# Patient Record
Sex: Female | Born: 1965 | Race: White | Hispanic: Yes | Marital: Married | Smoking: Never smoker
Health system: Southern US, Community
[De-identification: ages and names within clinical notes are randomized; demographics above are authoritative.]

## PROBLEM LIST (undated history)

## (undated) DIAGNOSIS — N809 Endometriosis, unspecified: Secondary | ICD-10-CM

## (undated) DIAGNOSIS — A048 Other specified bacterial intestinal infections: Secondary | ICD-10-CM

## (undated) DIAGNOSIS — Z1379 Encounter for other screening for genetic and chromosomal anomalies: Secondary | ICD-10-CM

## (undated) DIAGNOSIS — K5909 Other constipation: Secondary | ICD-10-CM

## (undated) DIAGNOSIS — K589 Irritable bowel syndrome without diarrhea: Secondary | ICD-10-CM

## (undated) DIAGNOSIS — T7840XA Allergy, unspecified, initial encounter: Secondary | ICD-10-CM

## (undated) DIAGNOSIS — J45909 Unspecified asthma, uncomplicated: Secondary | ICD-10-CM

## (undated) DIAGNOSIS — A498 Other bacterial infections of unspecified site: Secondary | ICD-10-CM

## (undated) DIAGNOSIS — K5792 Diverticulitis of intestine, part unspecified, without perforation or abscess without bleeding: Secondary | ICD-10-CM

## (undated) DIAGNOSIS — R109 Unspecified abdominal pain: Secondary | ICD-10-CM

## (undated) DIAGNOSIS — G8929 Other chronic pain: Secondary | ICD-10-CM

## (undated) DIAGNOSIS — N6019 Diffuse cystic mastopathy of unspecified breast: Secondary | ICD-10-CM

## (undated) HISTORY — DX: Irritable bowel syndrome, unspecified: K58.9

## (undated) HISTORY — DX: Unspecified abdominal pain: R10.9

## (undated) HISTORY — DX: Other bacterial infections of unspecified site: A49.8

## (undated) HISTORY — DX: Endometriosis, unspecified: N80.9

## (undated) HISTORY — PX: UPPER GASTROINTESTINAL ENDOSCOPY: SHX188

## (undated) HISTORY — PX: HERNIA REPAIR: SHX51

## (undated) HISTORY — DX: Diverticulitis of intestine, part unspecified, without perforation or abscess without bleeding: K57.92

## (undated) HISTORY — DX: Allergy, unspecified, initial encounter: T78.40XA

## (undated) HISTORY — DX: Diffuse cystic mastopathy of unspecified breast: N60.19

## (undated) HISTORY — DX: Other chronic pain: G89.29

## (undated) HISTORY — DX: Encounter for other screening for genetic and chromosomal anomalies: Z13.79

## (undated) HISTORY — PX: ECTOPIC PREGNANCY SURGERY: SHX613

---
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
## 2001-07-05 HISTORY — PX: EYE SURGERY: SHX253

## 2004-04-06 ENCOUNTER — Emergency Department: Payer: Self-pay | Admitting: Emergency Medicine

## 2005-07-05 HISTORY — PX: ABDOMINAL HYSTERECTOMY: SHX81

## 2005-07-26 ENCOUNTER — Inpatient Hospital Stay: Payer: Self-pay | Admitting: Obstetrics and Gynecology

## 2006-02-17 ENCOUNTER — Ambulatory Visit: Payer: Self-pay | Admitting: Internal Medicine

## 2006-04-07 ENCOUNTER — Ambulatory Visit: Payer: Self-pay | Admitting: Gastroenterology

## 2006-04-20 ENCOUNTER — Ambulatory Visit: Payer: Self-pay | Admitting: General Practice

## 2006-05-03 ENCOUNTER — Ambulatory Visit: Payer: Self-pay | Admitting: General Practice

## 2007-01-27 ENCOUNTER — Emergency Department: Payer: Self-pay | Admitting: Emergency Medicine

## 2007-08-01 ENCOUNTER — Ambulatory Visit: Payer: Self-pay | Admitting: Family Medicine

## 2008-07-05 HISTORY — PX: COLONOSCOPY: SHX174

## 2008-08-20 ENCOUNTER — Ambulatory Visit: Payer: Self-pay | Admitting: Family Medicine

## 2008-09-04 ENCOUNTER — Ambulatory Visit: Payer: Self-pay | Admitting: Gastroenterology

## 2008-09-12 ENCOUNTER — Ambulatory Visit: Payer: Self-pay | Admitting: Gastroenterology

## 2008-09-17 ENCOUNTER — Ambulatory Visit: Payer: Self-pay | Admitting: Gastroenterology

## 2009-04-07 ENCOUNTER — Ambulatory Visit: Payer: Self-pay | Admitting: Family Medicine

## 2009-05-20 ENCOUNTER — Ambulatory Visit: Payer: Self-pay | Admitting: Family Medicine

## 2010-04-28 ENCOUNTER — Ambulatory Visit: Payer: Self-pay | Admitting: General Practice

## 2010-12-02 ENCOUNTER — Encounter: Payer: Self-pay | Admitting: Family Medicine

## 2010-12-04 ENCOUNTER — Encounter: Payer: Self-pay | Admitting: Family Medicine

## 2011-01-03 ENCOUNTER — Encounter: Payer: Self-pay | Admitting: Family Medicine

## 2011-04-27 ENCOUNTER — Ambulatory Visit: Payer: Self-pay

## 2012-05-02 ENCOUNTER — Ambulatory Visit: Payer: Self-pay | Admitting: Family Medicine

## 2012-09-30 ENCOUNTER — Encounter: Payer: Self-pay | Admitting: General Surgery

## 2012-11-10 ENCOUNTER — Encounter: Payer: Self-pay | Admitting: *Deleted

## 2012-11-23 ENCOUNTER — Ambulatory Visit (INDEPENDENT_AMBULATORY_CARE_PROVIDER_SITE_OTHER): Payer: PRIVATE HEALTH INSURANCE | Admitting: General Surgery

## 2012-11-23 ENCOUNTER — Other Ambulatory Visit: Payer: Self-pay | Admitting: *Deleted

## 2012-11-23 ENCOUNTER — Encounter: Payer: Self-pay | Admitting: General Surgery

## 2012-11-23 VITALS — BP 100/64 | HR 80 | Resp 14 | Ht 63.0 in | Wt 137.0 lb

## 2012-11-23 DIAGNOSIS — Z1231 Encounter for screening mammogram for malignant neoplasm of breast: Secondary | ICD-10-CM

## 2012-11-23 DIAGNOSIS — Z803 Family history of malignant neoplasm of breast: Secondary | ICD-10-CM | POA: Insufficient documentation

## 2012-11-23 DIAGNOSIS — N6019 Diffuse cystic mastopathy of unspecified breast: Secondary | ICD-10-CM

## 2012-11-23 NOTE — Progress Notes (Signed)
The patient will be asked to return to the office in six months for a bilateral screening mammogram. 

## 2012-11-23 NOTE — Progress Notes (Signed)
Patient ID: Isabella Robertson, female   DOB: Jan 27, 1966, 47 y.o.   MRN: [id number]  Chief Complaint  Patient presents with  . Other    breast check    HPI Isabella Robertson is a 47 y.o. female here today for an follow up breast check. Patient was seen in January 2014 for left breast pain. 2 cysts were aspirated at that time. The patient still complains for left breast pain but no other complaints at this time.  HPI  Past Medical History  Diagnosis Date  . Diffuse cystic mastopathy   . Endometriosis     Past Surgical History  Procedure Laterality Date  . Ectopic pregnancy surgery  4098,1191  . Abdominal hysterectomy  2007  . Colonoscopy  2010  . Eye surgery  2003  . Cesarean section  2003  . Hernia repair  age 51    Family History  Problem Relation Age of Onset  . Breast cancer Paternal Aunt   . Breast cancer Mother 85  . Breast cancer Cousin     breast - paternal side    Social History History  Substance Use Topics  . Smoking status: Never Smoker   . Smokeless tobacco: Never Used  . Alcohol Use: No    No Known Allergies  Current Outpatient Prescriptions  Medication Sig Dispense Refill  . cetirizine (ZYRTEC) 10 MG tablet Take 10 mg by mouth daily as needed for allergies.       No current facility-administered medications for this visit.    Review of Systems Review of Systems  Constitutional: Negative.   Respiratory: Negative.   Cardiovascular: Negative.     Blood pressure 100/64, pulse 80, resp. rate 14, height 5\' 3"  (1.6 m), weight 137 lb (62.143 kg).  Physical Exam Physical Exam  Constitutional: She appears well-developed and well-nourished.  Neck: Trachea normal. No mass and no thyromegaly present.  Pulmonary/Chest: Right breast exhibits no inverted nipple, no mass, no nipple discharge, no skin change and no tenderness. Left breast exhibits no inverted nipple, no mass, no nipple discharge, no skin change and no tenderness. Breasts are symmetrical.   Lymphadenopathy:    She has no cervical adenopathy.    She has no axillary adenopathy.    Data Reviewed none Assessment    Fibercystic disease stable exam.   Her mother had breast cancer at age 71 and a paternal cousin with breast cancer.   Plan    Given high risk discussed genetic testing patient is agreeable as long as finances allow.     Next follow up in November 2014 with bilateral screening mammogram.    Kieth Brightly 11/23/2012, 2:47 PM

## 2012-11-23 NOTE — Patient Instructions (Addendum)
Patient to return in November 2014 with bilateral screening mammogram. Continue with monthly SBE.

## 2012-11-28 ENCOUNTER — Telehealth: Payer: Self-pay | Admitting: *Deleted

## 2012-11-28 DIAGNOSIS — Z1379 Encounter for other screening for genetic and chromosomal anomalies: Secondary | ICD-10-CM

## 2012-11-28 HISTORY — DX: Encounter for other screening for genetic and chromosomal anomalies: Z13.79

## 2012-11-28 NOTE — Telephone Encounter (Signed)
Phone call from pt/employer asking about testing.  Genetic testing process, cost and insurance process reviewed.  Pt to call back when ready to proceed.  She will need a nurse appt and aware NPO 1 hr before test.

## 2012-12-12 ENCOUNTER — Telehealth: Payer: Self-pay | Admitting: *Deleted

## 2012-12-12 NOTE — Telephone Encounter (Signed)
Notified patient as instructed that genetic testing was negative, patient pleased. Mailed copy to patient. Discussed follow-up appointments in November, patient agrees

## 2012-12-19 ENCOUNTER — Encounter: Payer: Self-pay | Admitting: General Surgery

## 2013-04-04 ENCOUNTER — Ambulatory Visit: Payer: Self-pay | Admitting: Family Medicine

## 2013-05-02 ENCOUNTER — Ambulatory Visit: Payer: Self-pay | Admitting: General Practice

## 2013-05-03 ENCOUNTER — Encounter: Payer: Self-pay | Admitting: General Surgery

## 2013-05-14 ENCOUNTER — Encounter: Payer: Self-pay | Admitting: General Surgery

## 2013-05-14 ENCOUNTER — Ambulatory Visit (INDEPENDENT_AMBULATORY_CARE_PROVIDER_SITE_OTHER): Payer: PRIVATE HEALTH INSURANCE | Admitting: General Surgery

## 2013-05-14 VITALS — BP 120/74 | HR 76 | Resp 12 | Ht 63.0 in | Wt 140.0 lb

## 2013-05-14 DIAGNOSIS — D179 Benign lipomatous neoplasm, unspecified: Secondary | ICD-10-CM

## 2013-05-14 DIAGNOSIS — Z803 Family history of malignant neoplasm of breast: Secondary | ICD-10-CM

## 2013-05-14 DIAGNOSIS — N6019 Diffuse cystic mastopathy of unspecified breast: Secondary | ICD-10-CM

## 2013-05-14 NOTE — Patient Instructions (Addendum)
Patient to return in 1 year with a bilateral screening mammogram. 

## 2013-05-14 NOTE — Progress Notes (Signed)
Patient ID: Isabella Robertson, female   DOB: April 01, 1966, 47 y.o.   MRN: [id number]  Chief Complaint  Patient presents with  . Follow-up    6 month screening mammogram    HPI Isabella Robertson is a 47 y.o. female who presents for a breast evaluation. The most recent mammogram was done on 05/02/13. Patient does perform regular self breast checks and gets regular mammograms done.  The patient denies any new problems with the breasts at this time. She has had prior cysts aspirated. Had genetic test done for family history - no mutation noted.    HPI  Past Medical History  Diagnosis Date  . Diffuse cystic mastopathy   . Endometriosis     Past Surgical History  Procedure Laterality Date  . Ectopic pregnancy surgery  8295,6213  . Abdominal hysterectomy  2007  . Colonoscopy  2010  . Eye surgery  2003  . Cesarean section  2003  . Hernia repair  age 89    Family History  Problem Relation Age of Onset  . Breast cancer Paternal Aunt   . Breast cancer Mother 35  . Breast cancer Cousin     breast - paternal side    Social History History  Substance Use Topics  . Smoking status: Never Smoker   . Smokeless tobacco: Never Used  . Alcohol Use: No    No Known Allergies  Current Outpatient Prescriptions  Medication Sig Dispense Refill  . cetirizine (ZYRTEC) 10 MG tablet Take 10 mg by mouth daily as needed for allergies.       No current facility-administered medications for this visit.    Review of Systems Review of Systems  Constitutional: Negative.   Respiratory: Negative.   Cardiovascular: Negative.     Blood pressure 120/74, pulse 76, resp. rate 12, height 5\' 3"  (1.6 m), weight 140 lb (63.504 kg).  Physical Exam Physical Exam  Constitutional: She is oriented to person, place, and time. She appears well-developed and well-nourished.  Eyes: Conjunctivae are normal. No scleral icterus.  Neck: No thyromegaly present.  Cardiovascular: Normal rate, regular rhythm, normal  heart sounds and normal pulses.   No murmur heard. Pulses:      Dorsalis pedis pulses are 2+ on the right side, and 2+ on the left side.       Posterior tibial pulses are 2+ on the right side, and 2+ on the left side.  No edema no varicose veins.   Pulmonary/Chest: Effort normal and breath sounds normal. Right breast exhibits no inverted nipple, no mass, no nipple discharge, no skin change and no tenderness. Left breast exhibits no inverted nipple, no mass, no nipple discharge, no skin change and no tenderness.  There is more firmness and mild irregularity in the upper outer quadrant of both breasts.   Abdominal: Soft. Normal appearance and bowel sounds are normal. There is no hepatosplenomegaly. There is no tenderness.  2 cm lipoma just below the left costal margin.   Lymphadenopathy:    She has no cervical adenopathy.    She has no axillary adenopathy.  Neurological: She is alert and oriented to person, place, and time.  Skin: Skin is warm and dry.    Data Reviewed Mammogram reviewed and stable.   Assessment    Exam stable. High risk for breast cancer due to family history. FCD, stable. Pt has a lipoma below left costal margin. This is not symptomatic and needs no further evaluation-she did have an [place] already and this was reviewed.Marland Kitchen  Pt advised to call if it causes pain or enlarges in size.    Plan    Patient to return in 1 year with bilateral screening mammogram.        Gerlene Robertson G 05/14/2013, 6:41 PM

## 2014-02-19 ENCOUNTER — Ambulatory Visit: Payer: Self-pay | Admitting: Gastroenterology

## 2014-02-28 ENCOUNTER — Ambulatory Visit: Payer: Self-pay | Admitting: Gastroenterology

## 2014-03-05 ENCOUNTER — Ambulatory Visit: Payer: Self-pay | Admitting: Gastroenterology

## 2014-04-30 ENCOUNTER — Ambulatory Visit: Payer: Self-pay | Admitting: General Surgery

## 2014-05-01 ENCOUNTER — Encounter: Payer: Self-pay | Admitting: General Surgery

## 2014-05-06 ENCOUNTER — Encounter: Payer: Self-pay | Admitting: General Surgery

## 2014-05-13 ENCOUNTER — Encounter: Payer: Self-pay | Admitting: General Surgery

## 2014-05-13 ENCOUNTER — Ambulatory Visit (INDEPENDENT_AMBULATORY_CARE_PROVIDER_SITE_OTHER): Payer: PRIVATE HEALTH INSURANCE | Admitting: General Surgery

## 2014-05-13 VITALS — BP 130/70 | HR 72 | Resp 12 | Ht 63.0 in | Wt 137.0 lb

## 2014-05-13 DIAGNOSIS — N6019 Diffuse cystic mastopathy of unspecified breast: Secondary | ICD-10-CM

## 2014-05-13 DIAGNOSIS — Z803 Family history of malignant neoplasm of breast: Secondary | ICD-10-CM

## 2014-05-13 NOTE — Progress Notes (Signed)
Patient ID: Isabella Robertson, female   DOB: [birth year], 48 y.o.   MRN: [id number]  Chief Complaint  Patient presents with  . Follow-up    1 year bilateral screening mammogram    HPI Isabella Robertson is a 48 y.o. female who presents for a breast evaluation. The most recent mammogram was done on 04/30/14.  Patient does perform regular self breast checks and gets regular mammograms done.  No new complaints at this time.    HPI  Past Medical History  Diagnosis Date  . Diffuse cystic mastopathy   . Endometriosis     Past Surgical History  Procedure Laterality Date  . Ectopic pregnancy surgery  4158,3094  . Abdominal hysterectomy  2007  . Colonoscopy  2010  . Eye surgery  2003  . Cesarean section  2003  . Hernia repair  age 40    Family History  Problem Relation Age of Onset  . Breast cancer Paternal Aunt   . Breast cancer Mother 63  . Breast cancer Cousin     breast - paternal side    Social History History  Substance Use Topics  . Smoking status: Never Smoker   . Smokeless tobacco: Never Used  . Alcohol Use: No    No Known Allergies  Current Outpatient Prescriptions  Medication Sig Dispense Refill  . omeprazole (PRILOSEC) 20 MG capsule   11   No current facility-administered medications for this visit.    Review of Systems Review of Systems  Constitutional: Negative.   Respiratory: Negative.   Cardiovascular: Negative.     Blood pressure 130/70, pulse 72, resp. rate 12, height '5\' 3"'  (1.6 m), weight 137 lb (62.143 kg).  Physical Exam Physical Exam  Constitutional: She is oriented to person, place, and time. She appears well-developed and well-nourished.  Eyes: Conjunctivae are normal. No scleral icterus.  Neck: Neck supple. No thyromegaly present.  Cardiovascular: Normal rate, regular rhythm and normal heart sounds.   No murmur heard. Pulmonary/Chest: Effort normal and breath sounds normal. Right breast exhibits no inverted nipple, no  mass, no nipple discharge, no skin change and no tenderness. Left breast exhibits no inverted nipple, no mass, no nipple discharge, no skin change and no tenderness.  Lymphadenopathy:    She has no cervical adenopathy.    She has no axillary adenopathy.  Neurological: She is alert and oriented to person, place, and time.  Skin: Skin is warm and dry.    Data Reviewed Mammogram reviewed and stable.   Assessment    Stable exam. Pt has had prior breast cysts. Has FH of breast cancer, BRCA neg     Plan    The patient has been asked to return to the office in one year with a bilateral screening mammogram.     Interpretor: Donn Pierini 05/13/2014, 8:35 PM

## 2014-05-13 NOTE — Patient Instructions (Addendum)
The patient has been asked to return to the office in one year with a bilateral screening mammogram. Continue self breast exams. Call office for any new breast issues or concerns.  

## 2015-01-24 ENCOUNTER — Ambulatory Visit: Payer: Self-pay | Admitting: Family Medicine

## 2015-02-05 ENCOUNTER — Ambulatory Visit (INDEPENDENT_AMBULATORY_CARE_PROVIDER_SITE_OTHER): Payer: PRIVATE HEALTH INSURANCE | Admitting: Family Medicine

## 2015-02-05 ENCOUNTER — Encounter: Payer: Self-pay | Admitting: Family Medicine

## 2015-02-05 ENCOUNTER — Encounter (INDEPENDENT_AMBULATORY_CARE_PROVIDER_SITE_OTHER): Payer: Self-pay

## 2015-02-05 VITALS — BP 104/60 | HR 77 | Temp 98.3°F | Ht 62.0 in | Wt 141.2 lb

## 2015-02-05 DIAGNOSIS — K589 Irritable bowel syndrome without diarrhea: Secondary | ICD-10-CM | POA: Diagnosis not present

## 2015-02-05 DIAGNOSIS — D179 Benign lipomatous neoplasm, unspecified: Secondary | ICD-10-CM

## 2015-02-05 DIAGNOSIS — E663 Overweight: Secondary | ICD-10-CM | POA: Diagnosis not present

## 2015-02-05 DIAGNOSIS — Z8711 Personal history of peptic ulcer disease: Secondary | ICD-10-CM | POA: Insufficient documentation

## 2015-02-05 DIAGNOSIS — Z13220 Encounter for screening for lipoid disorders: Secondary | ICD-10-CM

## 2015-02-05 DIAGNOSIS — Z8619 Personal history of other infectious and parasitic diseases: Secondary | ICD-10-CM | POA: Insufficient documentation

## 2015-02-05 DIAGNOSIS — Z0001 Encounter for general adult medical examination with abnormal findings: Secondary | ICD-10-CM | POA: Insufficient documentation

## 2015-02-05 DIAGNOSIS — Z0000 Encounter for general adult medical examination without abnormal findings: Secondary | ICD-10-CM | POA: Insufficient documentation

## 2015-02-05 DIAGNOSIS — K580 Irritable bowel syndrome with diarrhea: Secondary | ICD-10-CM | POA: Insufficient documentation

## 2015-02-05 DIAGNOSIS — K588 Other irritable bowel syndrome: Secondary | ICD-10-CM | POA: Insufficient documentation

## 2015-02-05 DIAGNOSIS — Z8719 Personal history of other diseases of the digestive system: Secondary | ICD-10-CM | POA: Insufficient documentation

## 2015-02-05 MED ORDER — OMEPRAZOLE 20 MG PO CPDR: 20.0000 mg | DELAYED_RELEASE_CAPSULE | Freq: Every day | ORAL | Status: DC

## 2015-02-05 MED ORDER — DICYCLOMINE HCL 20 MG PO TABS: 20.0000 mg | ORAL_TABLET | Freq: Four times a day (QID) | ORAL | Status: DC | PRN

## 2015-02-05 NOTE — Assessment & Plan Note (Signed)
Doing well on Prilosec. Stable at this time.

## 2015-02-05 NOTE — Assessment & Plan Note (Signed)
Obtaining A1C and Lipid panel today. Not a candidate for Pap smear (s/p hysterectomy for benign causes). Placing order for Mammogram to be obtained later this year.

## 2015-02-05 NOTE — Progress Notes (Signed)
Pre visit review using our clinic review tool, if applicable. No additional management support is needed unless otherwise documented below in the visit note. 

## 2015-02-05 NOTE — Progress Notes (Signed)
Subjective:    Patient ID: Isabella Robertson, female    DOB: 17-Nov-1965, 49 y.o.   MRN: [id number]  HPI 49 year old female presents to clinic today to establish care.  Current concerns are below.  Translator used during visit Isabella Robertson [phone]).  1) IBS  Patient has long-standing history of irritable bowel per review of the medical record.  She states it is currently uncontrolled.  She continues to be bothered intermittently by abdominal discomfort, bloating and diarrhea.  She has some relief when avoiding certain foods.  Certain foods particularly dairy and bread seem to trigger her IBS.  She has been on medications in the past without significant improvement.  No recent constipation, hematochezia, melena. No reported fever, chills.  She would like to discuss this today.  2) Mass  Patient reports a one-year history of a mass is located under her left rib cage.  She states that this worries her as she has a family history of breast cancer.  She states it has slowly been increasing in size.  She denies any associated pain/discomfort.  No known inciting factor.  No other associated symptoms.  3) History of Gastric ulcer  Patient doing well at this time on Prilosec.  PMH, Surgical Hx, Family Hx, Social History reviewed and updated as below.  Past Medical History  Diagnosis Date  . Diffuse cystic mastopathy   . Endometriosis     History of; Now s/p hysterectomy.  . Diverticulitis   . Chronic abdominal pain   . IBS (irritable bowel syndrome)    Past Surgical History  Procedure Laterality Date  . Ectopic pregnancy surgery  8676,7209  . Abdominal hysterectomy  2007  . Colonoscopy  2010  . Eye surgery  2003  . Cesarean section  2003  . Hernia repair  age 51   Family History  Problem Relation Age of Onset  . Breast cancer Paternal Aunt   . Breast cancer Mother 36  . Breast cancer Cousin     breast - paternal side   History   Social History  .  Marital Status: Married    Spouse Name: N/A  . Number of Children: N/A  . Years of Education: N/A   Social History Main Topics  . Smoking status: Never Smoker   . Smokeless tobacco: Never Used  . Alcohol Use: No  . Drug Use: No  . Sexual Activity: Yes   Other Topics Concern  . Not on file   Social History Narrative   Review of Systems  Constitutional: Negative for fever and chills.  Gastrointestinal: Positive for abdominal pain, diarrhea and abdominal distention.  All other systems negative.      Objective: Blood pressure 104/60, pulse 77, temperature 98.3 F (36.8 C), temperature source Oral, height 5\' 2"  (1.575 m), weight 141 lb 4 oz (64.071 kg), SpO2 99 %. Body mass index is 25.83 kg/(m^2).    Physical Exam  Constitutional: She is oriented to person, place, and time. She appears well-developed and well-nourished. No distress.  HENT:  Head: Normocephalic and atraumatic.  Nose: Nose normal.  Mouth/Throat: Oropharynx is clear and moist. No oropharyngeal exudate.  Normal TM's bilaterally.   Eyes: Conjunctivae are normal. No scleral icterus.  Neck: Neck supple. No thyromegaly present.  Cardiovascular: Normal rate and regular rhythm.   No murmur heard. Pulmonary/Chest: Effort normal and breath sounds normal. She has no wheezes. She has no rales.  Abdominal: Soft. She exhibits no distension. There is no tenderness. There is no rebound and  no guarding.  Musculoskeletal: Normal range of motion. She exhibits no edema.  Lymphadenopathy:    She has no cervical adenopathy.  Neurological: She is alert and oriented to person, place, and time.  Skin: Skin is warm and dry. No rash noted.  LUQ/Left costal margin - 2-3 cm circumferential mass noted. Slightly firm and easily moveable. No surrounding erythema.  Psychiatric: She has a normal mood and affect.  Vitals reviewed.     Assessment & Plan:  See Problem List

## 2015-02-05 NOTE — Assessment & Plan Note (Signed)
Mass is noted on exam is consistent with lipoma. I informed the patient the benign nature of lipomas. I offered referral to general surgery if she would like it excised. She wants to proceed with watchful waiting at this time.

## 2015-02-05 NOTE — Assessment & Plan Note (Signed)
Uncontrolled. Patient to continue avoiding foods that trigger symptoms. Prescription given today for dicyclomine to aid in abdominal pain/cramping.

## 2015-02-05 NOTE — Patient Instructions (Addendum)
Fue agradable ver que en la actualidad.  Utilice la Diciclomina como se indica.  Vamos a programar su mamografa para finales de este ao.  Seguimiento en 3-6 meses.  Llame si tiene preguntas / preocupaciones.  El Dr. Lacinda Axon,

## 2015-02-06 LAB — LIPID PANEL
CHOL/HDL RATIO: 5
Cholesterol: 235 mg/dL — ABNORMAL HIGH (ref 0–200)
HDL: 49.7 mg/dL (ref >39.00)
NONHDL: 185.78
Triglycerides: 291 mg/dL — ABNORMAL HIGH (ref 0.0–149.0)
VLDL: 58.2 mg/dL — AB (ref 0.0–40.0)

## 2015-02-06 LAB — HEMOGLOBIN A1C: Hgb A1c MFr Bld: 5.1 % (ref 4.6–6.5)

## 2015-02-06 LAB — LDL CHOLESTEROL, DIRECT: LDL DIRECT: 144 mg/dL

## 2015-02-07 ENCOUNTER — Telehealth: Payer: Self-pay

## 2015-02-07 NOTE — Telephone Encounter (Signed)
Pt results were sent out to her my mail.

## 2015-04-01 ENCOUNTER — Other Ambulatory Visit: Payer: Self-pay

## 2015-04-01 ENCOUNTER — Other Ambulatory Visit: Payer: Self-pay | Admitting: Family Medicine

## 2015-04-02 ENCOUNTER — Ambulatory Visit
Admission: RE | Admit: 2015-04-02 | Discharge: 2015-04-02 | Disposition: A | Discharge status: Home / Self Care | Payer: PRIVATE HEALTH INSURANCE | Source: Ambulatory Visit | Attending: Gastroenterology | Admitting: Gastroenterology

## 2015-04-02 ENCOUNTER — Other Ambulatory Visit: Payer: Self-pay | Admitting: Gastroenterology

## 2015-04-02 DIAGNOSIS — M5410 Radiculopathy, site unspecified: Secondary | ICD-10-CM

## 2015-04-02 DIAGNOSIS — R109 Unspecified abdominal pain: Principal | ICD-10-CM

## 2015-04-02 DIAGNOSIS — G8929 Other chronic pain: Secondary | ICD-10-CM

## 2015-04-02 LAB — CMP12+LP+TP+TSH+6AC+CBC/D/PLT
ALBUMIN: 4.6 g/dL (ref 3.5–5.5)
ALK PHOS: 61 IU/L (ref 39–117)
ALT: 26 IU/L (ref 0–32)
AST: 28 IU/L (ref 0–40)
Albumin/Globulin Ratio: 1.4 (ref 1.1–2.5)
BASOS ABS: 0 10*3/uL (ref 0.0–0.2)
BASOS: 1 %
BILIRUBIN TOTAL: 1 mg/dL (ref 0.0–1.2)
BUN / CREAT RATIO: 10 (ref 9–23)
BUN: 9 mg/dL (ref 6–24)
CHLORIDE: 97 mmol/L (ref 97–108)
CHOLESTEROL TOTAL: 250 mg/dL — AB (ref 100–199)
Calcium: 9.4 mg/dL (ref 8.7–10.2)
Chol/HDL Ratio: 5.3 ratio units — ABNORMAL HIGH (ref 0.0–4.4)
Creatinine, Ser: 0.87 mg/dL (ref 0.57–1.00)
EOS (ABSOLUTE): 0.1 10*3/uL (ref 0.0–0.4)
EOS: 2 %
ESTIMATED CHD RISK: 1.4 times avg. — AB (ref 0.0–1.0)
FREE THYROXINE INDEX: 2 (ref 1.2–4.9)
GFR calc Af Amer: 91 mL/min/{1.73_m2} (ref >59)
GFR calc non Af Amer: 79 mL/min/{1.73_m2} (ref >59)
GGT: 19 IU/L (ref 0–60)
Globulin, Total: 3.4 g/dL (ref 1.5–4.5)
Glucose: 92 mg/dL (ref 65–99)
HDL: 47 mg/dL (ref >39)
HEMOGLOBIN: 14.5 g/dL (ref 11.1–15.9)
Hematocrit: 42.7 % (ref 34.0–46.6)
IMMATURE GRANS (ABS): 0 10*3/uL (ref 0.0–0.1)
IMMATURE GRANULOCYTES: 0 %
Iron: 96 ug/dL (ref 27–159)
LDH: 180 IU/L (ref 119–226)
LDL Calculated: 165 mg/dL — ABNORMAL HIGH (ref 0–99)
LYMPHS: 48 %
Lymphocytes Absolute: 1.6 10*3/uL (ref 0.7–3.1)
MCH: 30.3 pg (ref 26.6–33.0)
MCHC: 34 g/dL (ref 31.5–35.7)
MCV: 89 fL (ref 79–97)
Monocytes Absolute: 0.3 10*3/uL (ref 0.1–0.9)
Monocytes: 10 %
NEUTROS PCT: 39 %
Neutrophils Absolute: 1.3 10*3/uL — ABNORMAL LOW (ref 1.4–7.0)
Phosphorus: 3.6 mg/dL (ref 2.5–4.5)
Platelets: 279 10*3/uL (ref 150–379)
Potassium: 4.2 mmol/L (ref 3.5–5.2)
RBC: 4.78 x10E6/uL (ref 3.77–5.28)
RDW: 13.4 % (ref 12.3–15.4)
SODIUM: 139 mmol/L (ref 134–144)
T3 Uptake Ratio: 27 % (ref 24–39)
T4, Total: 7.4 ug/dL (ref 4.5–12.0)
TRIGLYCERIDES: 191 mg/dL — AB (ref 0–149)
TSH: 0.847 u[IU]/mL (ref 0.450–4.500)
Total Protein: 8 g/dL (ref 6.0–8.5)
Uric Acid: 5.7 mg/dL (ref 2.5–7.1)
VLDL CHOLESTEROL CAL: 38 mg/dL (ref 5–40)
WBC: 3.3 10*3/uL — AB (ref 3.4–10.8)

## 2015-04-02 LAB — HGB A1C W/O EAG: Hgb A1c MFr Bld: 5.5 % (ref 4.8–5.6)

## 2015-04-03 ENCOUNTER — Other Ambulatory Visit: Payer: Self-pay

## 2015-04-04 ENCOUNTER — Ambulatory Visit: Payer: PRIVATE HEALTH INSURANCE

## 2015-04-07 ENCOUNTER — Ambulatory Visit
Admission: RE | Admit: 2015-04-07 | Discharge: 2015-04-07 | Disposition: A | Discharge status: Home / Self Care | Payer: PRIVATE HEALTH INSURANCE | Source: Ambulatory Visit | Attending: Gastroenterology | Admitting: Gastroenterology

## 2015-04-07 DIAGNOSIS — R109 Unspecified abdominal pain: Secondary | ICD-10-CM | POA: Insufficient documentation

## 2015-04-07 DIAGNOSIS — G8929 Other chronic pain: Secondary | ICD-10-CM | POA: Diagnosis present

## 2015-04-22 ENCOUNTER — Ambulatory Visit: Payer: PRIVATE HEALTH INSURANCE

## 2015-04-22 ENCOUNTER — Other Ambulatory Visit: Payer: Self-pay | Admitting: Emergency Medicine

## 2015-04-22 DIAGNOSIS — Z1231 Encounter for screening mammogram for malignant neoplasm of breast: Secondary | ICD-10-CM

## 2015-04-23 ENCOUNTER — Ambulatory Visit
Admission: RE | Admit: 2015-04-23 | Discharge: 2015-04-23 | Disposition: A | Discharge status: Home / Self Care | Payer: PRIVATE HEALTH INSURANCE | Source: Ambulatory Visit | Attending: Emergency Medicine | Admitting: Emergency Medicine

## 2015-04-23 DIAGNOSIS — Z1231 Encounter for screening mammogram for malignant neoplasm of breast: Secondary | ICD-10-CM | POA: Insufficient documentation

## 2015-05-06 ENCOUNTER — Encounter: Payer: Self-pay | Admitting: General Surgery

## 2015-05-06 ENCOUNTER — Ambulatory Visit (INDEPENDENT_AMBULATORY_CARE_PROVIDER_SITE_OTHER): Payer: PRIVATE HEALTH INSURANCE | Admitting: General Surgery

## 2015-05-06 VITALS — BP 140/82 | HR 74 | Resp 12 | Ht 63.0 in | Wt 142.0 lb

## 2015-05-06 DIAGNOSIS — N6019 Diffuse cystic mastopathy of unspecified breast: Secondary | ICD-10-CM | POA: Diagnosis not present

## 2015-05-06 DIAGNOSIS — Z803 Family history of malignant neoplasm of breast: Secondary | ICD-10-CM

## 2015-05-06 NOTE — Patient Instructions (Addendum)
Patient will be asked to return to the office in one year with a bilateral screening mammogram. Scheduled left wall lipoma removal

## 2015-05-06 NOTE — Progress Notes (Signed)
Patient ID: Isabella Robertson, female   DOB: [birth year], 49 y.o.   MRN: [id number]  Chief Complaint  Patient presents with  . Follow-up    mammogram    HPI Isabella Robertson is a 49 y.o. female. who presents for a breast evaluation. The most recent mammogram was done on 04/23/15.  Patient does perform regular self breast checks and gets regular mammograms done.   I have reviewed the history of present illness with the patient.  HPI  Past Medical History  Diagnosis Date  . Diffuse cystic mastopathy   . Endometriosis     History of; Now s/p hysterectomy.  . Diverticulitis   . Chronic abdominal pain   . IBS (irritable bowel syndrome)     Past Surgical History  Procedure Laterality Date  . Ectopic pregnancy surgery  5188,4166  . Abdominal hysterectomy  2007  . Colonoscopy  2010  . Eye surgery  2003  . Cesarean section  2003  . Hernia repair  age 71    Family History  Problem Relation Age of Onset  . Breast cancer Paternal Aunt   . Breast cancer Mother 1  . Breast cancer Cousin     breast - paternal side    Social History Social History  Substance Use Topics  . Smoking status: Never Smoker   . Smokeless tobacco: Never Used  . Alcohol Use: No    No Known Allergies  No current outpatient prescriptions on file.   No current facility-administered medications for this visit.    Review of Systems Review of Systems  Constitutional: Negative.   Respiratory: Negative.   Cardiovascular: Negative.     Blood pressure 140/82, pulse 74, resp. rate 12, height 5\' 3"  (1.6 m), weight 142 lb (64.411 kg).  Physical Exam Physical Exam  Constitutional: She is oriented to person, place, and time. She appears well-developed and well-nourished.  Eyes: Conjunctivae are normal. No scleral icterus.  Neck: Neck supple.  Cardiovascular: Normal rate, regular rhythm and normal heart sounds.   Pulmonary/Chest: Effort normal and breath sounds normal. Right breast exhibits  no inverted nipple, no mass, no nipple discharge, no skin change and no tenderness. Left breast exhibits no inverted nipple, no mass, no nipple discharge, no skin change and no tenderness.  Abdominal: Soft. Bowel sounds are normal.  Lymphadenopathy:    She has no cervical adenopathy.    She has no axillary adenopathy.  Neurological: She is alert and oriented to person, place, and time.  Skin: Skin is warm.       Data Reviewed Mammogram reviewed and stable.  Assessment   Stable exam,Pt has had prior breast cysts. Has FH of breast cancer. Lipoma abdominal wall has doubled in size over last 2 yrs.  Plan    Excision lipoma in office. Pt is agreeable.  Patient will be asked to return to the office in one year with a bilateral screening mammogram. Patient to have excision lipoma.  PCP:  Thersa Salt  [facility] G 05/06/2015, 5:46 PM

## 2015-05-22 ENCOUNTER — Encounter: Payer: Self-pay | Admitting: General Surgery

## 2015-05-22 ENCOUNTER — Ambulatory Visit (INDEPENDENT_AMBULATORY_CARE_PROVIDER_SITE_OTHER): Payer: PRIVATE HEALTH INSURANCE | Admitting: General Surgery

## 2015-05-22 VITALS — BP 100/64 | HR 86 | Resp 14 | Ht 63.0 in | Wt 143.0 lb

## 2015-05-22 DIAGNOSIS — D171 Benign lipomatous neoplasm of skin and subcutaneous tissue of trunk: Secondary | ICD-10-CM | POA: Diagnosis not present

## 2015-05-22 NOTE — Patient Instructions (Addendum)
Keep are clean May remove dressing in 2-3 days, steri strips will come off gradually

## 2015-05-22 NOTE — Progress Notes (Signed)
Here today for excision of an abdominal wall lipoma.  Procedure: Excision lipoma, abdominal wall left upper quadrant  Anesthesia: 10 mL 1% xylocaine mixed with 0.5% marcaine and 5 mL of 1% xylocaine plain  Prep: ChloraPrep  Description: Area was prepped and draped as a sterile field. A transverse incision was made over the palpable mass. The mass was freed using blunt and sharp dissection from surrounding subcutaneous tissue.  The mass measured 5 x 3 x 3 cm and was fairly well circumscribed. Bleeding was minimal and controlled with pressure. Closure of subcutaneous tissue was with 3-0 Vicryl and skin was with subcuticular 4-0 vicryl. Steri strips with tincture benzoin were placed, dressing with telfa and tegaderm.   Procedure was well tolerated with no immediate problems noted. Prescription given for Tramadol 50 mg to use Q6H PRN. Wound care instructions were given. Excised tissue sent to pathology. She will be informed when report is available.

## 2015-05-23 ENCOUNTER — Encounter: Payer: Self-pay | Admitting: General Surgery

## 2015-05-27 ENCOUNTER — Telehealth: Payer: Self-pay | Admitting: *Deleted

## 2015-05-27 NOTE — Telephone Encounter (Signed)
Notified patient as instructed, patient pleased. Discussed follow-up appointments, patient agrees  

## 2015-05-27 NOTE — Telephone Encounter (Signed)
-----   Message from Christene Lye, MD sent at 05/27/2015  8:39 AM EST ----- Please let pt pt know the pathology was normal. Forward copy of report to PCP

## 2015-08-29 ENCOUNTER — Ambulatory Visit (INDEPENDENT_AMBULATORY_CARE_PROVIDER_SITE_OTHER): Payer: PRIVATE HEALTH INSURANCE | Admitting: Family Medicine

## 2015-08-29 ENCOUNTER — Encounter: Payer: Self-pay | Admitting: Family Medicine

## 2015-08-29 VITALS — BP 108/64 | HR 95 | Temp 98.1°F | Ht 63.0 in | Wt 146.0 lb

## 2015-08-29 DIAGNOSIS — R3129 Other microscopic hematuria: Secondary | ICD-10-CM | POA: Diagnosis not present

## 2015-08-29 DIAGNOSIS — J0100 Acute maxillary sinusitis, unspecified: Secondary | ICD-10-CM | POA: Insufficient documentation

## 2015-08-29 LAB — POCT URINALYSIS DIPSTICK
BILIRUBIN UA: NEGATIVE
GLUCOSE UA: NEGATIVE
KETONES UA: NEGATIVE
Leukocytes, UA: NEGATIVE
Nitrite, UA: NEGATIVE
PROTEIN UA: NEGATIVE
Spec Grav, UA: 1.015
Urobilinogen, UA: 0.2
pH, UA: 6

## 2015-08-29 LAB — URINALYSIS, MICROSCOPIC ONLY

## 2015-08-29 MED ORDER — LORATADINE 10 MG PO TABS: 10.0000 mg | ORAL_TABLET | Freq: Every day | ORAL | Status: DC

## 2015-08-29 MED ORDER — AMOXICILLIN-POT CLAVULANATE 875-125 MG PO TABS: 1.0000 | ORAL_TABLET | Freq: Two times a day (BID) | ORAL | Status: DC

## 2015-08-29 MED ORDER — FLUTICASONE PROPIONATE 50 MCG/ACT NA SUSP: 2.0000 | Freq: Every day | NASAL | Status: DC

## 2015-08-29 NOTE — Progress Notes (Signed)
Patient ID: Isabella Robertson, female   DOB: 1965-08-22, 50 y.o.   MRN: [id number]  Isabella Rumps, MD Phone: [phone]  Isabella Robertson is a 50 y.o. female who presents today for same day visit. She notes no issues with a tumor on her colon spiked this being listed in her visit scheduling complaint.  Patient notes onset of sinus congestion, rhinorrhea, and itchy eyes 2 weeks ago. She notes her ears of hurt as well. She notes this is progressively worsened. She is blowing cloudy material out of her nose. No fevers. Notes it is mostly in her maxillary sinuses. She notes her eyes do get matted together in the morning. Some clear drainage out of her eyes as well. She denies eye pain and vision changes.  Patient notes recently she was treated for a UTI through work. States they brought her back and did a repeat urinalysis and she was told there was microscopic blood. She was advised to follow-up with her urologist. She notes some intermittent burning and frequency and urgency every several weeks. None at this time. No abdominal pain with this. No vaginal symptoms. She denies gross hematuria.  PMH: nonsmoker.   ROS see history of present illness  Objective  Physical Exam Filed Vitals:   08/29/15 0855  BP: 108/64  Pulse: 95  Temp: 98.1 F (36.7 C)    BP Readings from Last 3 Encounters:  08/29/15 108/64  05/22/15 100/64  05/06/15 140/82   Wt Readings from Last 3 Encounters:  08/29/15 146 lb (66.225 kg)  05/22/15 143 lb (64.864 kg)  05/06/15 142 lb (64.411 kg)    Physical Exam  Constitutional: She is well-developed, well-nourished, and in no distress.  HENT:  Head: Normocephalic and atraumatic.  Right Ear: External ear normal.  Left Ear: External ear normal.  Mouth/Throat: Oropharynx is clear and moist. No oropharyngeal exudate.  Normal TMs bilaterally, mild tenderness to percussion of maxillary sinuses  Eyes: Pupils are equal, round, and reactive to light.    Minimal conjunctival erythema bilaterally, clear discharge from the eyes  Neck: Neck supple.  Cardiovascular: Normal rate, regular rhythm and normal heart sounds.  Exam reveals no gallop and no friction rub.   No murmur heard. Pulmonary/Chest: Effort normal and breath sounds normal. No respiratory distress. She has no wheezes. She has no rales.  Abdominal: Soft. Bowel sounds are normal. She exhibits no distension. There is no tenderness. There is no rebound and no guarding.  Musculoskeletal: She exhibits no edema.  Lymphadenopathy:    She has no cervical adenopathy.  Neurological: She is alert. Gait normal.  Skin: Skin is warm and dry. She is not diaphoretic.     Assessment/Plan: Please see individual problem list.  Microscopic hematuria Patient reports recent UA with microscopic hematuria. We will check a UA today and sent for microscopy and culture to ensure that there is no infection. She will continue to monitor and follow-up with her urologist.  Sinusitis, acute, maxillary Patient with symptoms of bacterial sinusitis. This could also be related to allergies. Given duration we'll treat with Augmentin. We'll also start on Claritin and Flonase. She can continue to use the allergy eyedrops as well. Vital signs are stable and patient is well-appearing. She will continue to monitor. She's given return precautions.    Orders Placed This Encounter  Procedures  . Urine Culture  . Urine Microscopic Only  . POCT Urinalysis Dipstick    Meds ordered this encounter  Medications  . loratadine (CLARITIN) 10 MG  tablet    Sig: Take 1 tablet (10 mg total) by mouth daily.    Dispense:  30 tablet    Refill:  11  . fluticasone (FLONASE) 50 MCG/ACT nasal spray    Sig: Place 2 sprays into both nostrils daily.    Dispense:  16 g    Refill:  6  . amoxicillin-clavulanate (AUGMENTIN) 875-125 MG tablet    Sig: Take 1 tablet by mouth 2 (two) times daily.    Dispense:  14 tablet    Refill:  0      Isabella Robertson

## 2015-08-29 NOTE — Progress Notes (Signed)
Pre visit review using our clinic review tool, if applicable. No additional management support is needed unless otherwise documented below in the visit note. 

## 2015-08-29 NOTE — Assessment & Plan Note (Signed)
Patient reports recent UA with microscopic hematuria. We will check a UA today and sent for microscopy and culture to ensure that there is no infection. She will continue to monitor and follow-up with her urologist.

## 2015-08-29 NOTE — Patient Instructions (Signed)
Nice to meet you. We will start you on Augmentin for a sinus infection. We will also treat you with Flonase and Claritin for this. We will check her urine for infection as well. If you develop fevers, burning with urination, abdominal pain, or any new or changing symptoms please seek medical attention.

## 2015-08-29 NOTE — Assessment & Plan Note (Signed)
Patient with symptoms of bacterial sinusitis. This could also be related to allergies. Given duration we'll treat with Augmentin. We'll also start on Claritin and Flonase. She can continue to use the allergy eyedrops as well. Vital signs are stable and patient is well-appearing. She will continue to monitor. She's given return precautions.

## 2015-08-31 LAB — URINE CULTURE: Colony Count: 40000

## 2016-02-15 ENCOUNTER — Other Ambulatory Visit: Payer: Self-pay | Admitting: Family Medicine

## 2016-03-20 ENCOUNTER — Other Ambulatory Visit: Payer: Self-pay | Admitting: Family Medicine

## 2016-03-22 NOTE — Telephone Encounter (Signed)
Last OV with PCP 8/16 ok to fill omeprazole.

## 2016-03-30 ENCOUNTER — Encounter: Payer: Self-pay | Admitting: *Deleted

## 2016-04-15 ENCOUNTER — Other Ambulatory Visit: Payer: Self-pay | Admitting: Family Medicine

## 2016-04-16 LAB — CMP12+LP+TP+TSH+6AC+CBC/D/PLT
ALK PHOS: 69 IU/L (ref 39–117)
ALT: 24 IU/L (ref 0–32)
AST: 28 IU/L (ref 0–40)
Albumin/Globulin Ratio: 1.4 (ref 1.2–2.2)
Albumin: 4.6 g/dL (ref 3.5–5.5)
BASOS: 1 %
BUN/Creatinine Ratio: 14 (ref 9–23)
BUN: 12 mg/dL (ref 6–24)
Basophils Absolute: 0 10*3/uL (ref 0.0–0.2)
Bilirubin Total: 0.9 mg/dL (ref 0.0–1.2)
CALCIUM: 9.7 mg/dL (ref 8.7–10.2)
CHLORIDE: 98 mmol/L (ref 96–106)
CHOL/HDL RATIO: 4.8 ratio — AB (ref 0.0–4.4)
Cholesterol, Total: 244 mg/dL — ABNORMAL HIGH (ref 100–199)
Creatinine, Ser: 0.86 mg/dL (ref 0.57–1.00)
EOS (ABSOLUTE): 0.1 10*3/uL (ref 0.0–0.4)
Eos: 4 %
Estimated CHD Risk: 1.2 times avg. — ABNORMAL HIGH (ref 0.0–1.0)
Free Thyroxine Index: 2 (ref 1.2–4.9)
GFR calc non Af Amer: 79 mL/min/{1.73_m2} (ref >59)
GFR, EST AFRICAN AMERICAN: 91 mL/min/{1.73_m2} (ref >59)
GGT: 18 IU/L (ref 0–60)
GLOBULIN, TOTAL: 3.3 g/dL (ref 1.5–4.5)
GLUCOSE: 91 mg/dL (ref 65–99)
HDL: 51 mg/dL (ref >39)
HEMATOCRIT: 41.1 % (ref 34.0–46.6)
Hemoglobin: 13.9 g/dL (ref 11.1–15.9)
IMMATURE GRANS (ABS): 0 10*3/uL (ref 0.0–0.1)
Immature Granulocytes: 0 %
Iron: 119 ug/dL (ref 27–159)
LDH: 170 IU/L (ref 119–226)
LDL CALC: 161 mg/dL — AB (ref 0–99)
LYMPHS: 45 %
Lymphocytes Absolute: 1.5 10*3/uL (ref 0.7–3.1)
MCH: 30.1 pg (ref 26.6–33.0)
MCHC: 33.8 g/dL (ref 31.5–35.7)
MCV: 89 fL (ref 79–97)
Monocytes Absolute: 0.3 10*3/uL (ref 0.1–0.9)
Monocytes: 10 %
NEUTROS ABS: 1.4 10*3/uL (ref 1.4–7.0)
Neutrophils: 40 %
POTASSIUM: 4.2 mmol/L (ref 3.5–5.2)
Phosphorus: 4 mg/dL (ref 2.5–4.5)
Platelets: 293 10*3/uL (ref 150–379)
RBC: 4.62 x10E6/uL (ref 3.77–5.28)
RDW: 13.2 % (ref 12.3–15.4)
SODIUM: 139 mmol/L (ref 134–144)
T3 Uptake Ratio: 28 % (ref 24–39)
T4, Total: 7.1 ug/dL (ref 4.5–12.0)
TRIGLYCERIDES: 159 mg/dL — AB (ref 0–149)
TSH: 0.846 u[IU]/mL (ref 0.450–4.500)
Total Protein: 7.9 g/dL (ref 6.0–8.5)
URIC ACID: 7.3 mg/dL — AB (ref 2.5–7.1)
VLDL Cholesterol Cal: 32 mg/dL (ref 5–40)
WBC: 3.4 10*3/uL (ref 3.4–10.8)

## 2016-04-16 LAB — HGB A1C W/O EAG: Hgb A1c MFr Bld: 5.2 % (ref 4.8–5.6)

## 2016-04-21 ENCOUNTER — Other Ambulatory Visit: Payer: Self-pay | Admitting: General Surgery

## 2016-04-21 DIAGNOSIS — Z1231 Encounter for screening mammogram for malignant neoplasm of breast: Secondary | ICD-10-CM

## 2016-04-23 ENCOUNTER — Encounter: Payer: Self-pay | Admitting: Family Medicine

## 2016-04-23 ENCOUNTER — Ambulatory Visit (INDEPENDENT_AMBULATORY_CARE_PROVIDER_SITE_OTHER): Payer: PRIVATE HEALTH INSURANCE | Admitting: Family Medicine

## 2016-04-23 VITALS — BP 120/88 | HR 78 | Temp 98.1°F | Ht 63.0 in | Wt 148.4 lb

## 2016-04-23 DIAGNOSIS — Z1211 Encounter for screening for malignant neoplasm of colon: Secondary | ICD-10-CM | POA: Diagnosis not present

## 2016-04-23 DIAGNOSIS — R519 Headache, unspecified: Secondary | ICD-10-CM | POA: Insufficient documentation

## 2016-04-23 DIAGNOSIS — Z0001 Encounter for general adult medical examination with abnormal findings: Secondary | ICD-10-CM

## 2016-04-23 DIAGNOSIS — R51 Headache: Secondary | ICD-10-CM | POA: Diagnosis not present

## 2016-04-23 DIAGNOSIS — R232 Flushing: Secondary | ICD-10-CM | POA: Diagnosis not present

## 2016-04-23 DIAGNOSIS — G43709 Chronic migraine without aura, not intractable, without status migrainosus: Secondary | ICD-10-CM | POA: Insufficient documentation

## 2016-04-23 LAB — FOLLICLE STIMULATING HORMONE: FSH: 88.4 m[IU]/mL

## 2016-04-23 MED ORDER — BUTALBITAL-APAP-CAFFEINE 50-325-40 MG PO TABS: 1.0000 | ORAL_TABLET | Freq: Four times a day (QID) | ORAL | 0 refills | Status: AC | PRN

## 2016-04-23 NOTE — Assessment & Plan Note (Signed)
No indication for Pap smear. Has upcoming mammogram. We'll proceed with Cologuard. Advised to follow-up with GI regarding chronic right lower quadrant pain. Tetanus up-to-date. Declines flu. Recent laboratory studies reviewed. ASCVD risk score 1.5%. Will hold off on statin therapy at this time.

## 2016-04-23 NOTE — Progress Notes (Signed)
Subjective:  Patient ID: Isabella Robertson, female    DOB: [birth year]  Age: 50 y.o. MRN: [id number]  CC: Physical, Headache  HPI:  50 year old female with chronic RLQ pain, IBS, hyperlipidemia presents for an annual physical exam. Patient also complains of headache.  Preventative Healthcare  Pap smear: s/p hysterectomy.  Mammogram: Has upcoming mammogram next week.  Colonoscopy: In need of. Reports issues with prior colonoscopies. We'll proceed with Cologuard.  Immunizations  Tetanus - Up to date.  Flu - Declines.  Labs: Has had recent labs.   Alcohol use: No.  Smoking/tobacco use: No.  Headache  Patient reports that she has had frequent headaches for the past few weeks.  She states that she has approximately 1 week.  She states that they have been occurring more frequently over the past 3 weeks.  She reports severe left-sided pain.  She reports associated photophobia.  No nausea.  She's been taking ibuprofen without significant improvement.  No known exacerbating factors.  No other associated symptoms.  No other complaints at this time.  PMH, Surgical Hx, Family Hx, Social History reviewed and updated as below.  Past Medical History:  Diagnosis Date  . Chronic abdominal pain   . Diffuse cystic mastopathy   . Diverticulitis   . Endometriosis    History of; Now s/p hysterectomy.  . IBS (irritable bowel syndrome)    Past Surgical History:  Procedure Laterality Date  . ABDOMINAL HYSTERECTOMY  2007  . CESAREAN SECTION  2003  . COLONOSCOPY  2010  . ECTOPIC PREGNANCY SURGERY  [id number]  . EYE SURGERY  2003  . HERNIA REPAIR  age 47   Family History  Problem Relation Age of Onset  . Breast cancer Paternal Aunt   . Breast cancer Mother 4  . Breast cancer Cousin     breast - paternal side   Social History  Substance Use Topics  . Smoking status: Never Smoker  . Smokeless tobacco: Never Used  . Alcohol use No   Review of Systems    Neurological: Positive for dizziness and headaches.  All other systems reviewed and are negative. Other - Hot flashes.  Objective:  BP 120/88 (BP Location: Right Arm, Patient Position: Sitting, Cuff Size: Normal)   Pulse 78   Temp 98.1 F (36.7 C) (Oral)   Ht 5\' 3"  (1.6 m)   Wt 148 lb 6 oz (67.3 kg)   SpO2 97%   BMI 26.28 kg/m   BP/Weight 04/23/2016 08/29/2015 [id number]  Systolic BP 123456 [id number]  Diastolic BP 88 64 64  Wt. (Lbs) 148.38 146 143  BMI 26.28 25.87 25.34   Physical Exam  Constitutional: She is oriented to person, place, and time. She appears well-developed and well-nourished. No distress.  HENT:  Head: Normocephalic and atraumatic.  Nose: Nose normal.  Mouth/Throat: Oropharynx is clear and moist. No oropharyngeal exudate.  Normal TM's bilaterally.   Eyes: Conjunctivae are normal. No scleral icterus.  Neck: Neck supple. No thyromegaly present.  Cardiovascular: Normal rate and regular rhythm.   No murmur heard. Pulmonary/Chest: Effort normal and breath sounds normal. She has no wheezes. She has no rales.  Abdominal: Soft. She exhibits no distension.  Right lower quadrant tenderness to palpation (chronic).  Musculoskeletal: Normal range of motion. She exhibits no edema.  Lymphadenopathy:    She has no cervical adenopathy.  Neurological: She is alert and oriented to person, place, and time.  Skin: Skin is warm and dry. No rash noted.  Psychiatric:  She has a normal mood and affect.  Vitals reviewed.   Lab Results  Component Value Date   WBC 3.4 04/15/2016   HCT 41.1 04/15/2016   PLT 293 04/15/2016   GLUCOSE 91 04/15/2016   CHOL 244 (H) 04/15/2016   TRIG 159 (H) 04/15/2016   HDL 51 04/15/2016   LDLDIRECT 144.0 02/05/2015   LDLCALC 161 (H) 04/15/2016   ALT 24 04/15/2016   AST 28 04/15/2016   NA 139 04/15/2016   K 4.2 04/15/2016   CL 98 04/15/2016   CREATININE 0.86 04/15/2016   BUN 12 04/15/2016   TSH 0.846 04/15/2016   HGBA1C 5.2 04/15/2016     Assessment & Plan:   Problem List Items Addressed This Visit    Headache    New problem. Features of migraine. Treating with Fioricet.      Relevant Medications   butalbital-acetaminophen-caffeine (FIORICET, ESGIC) 50-325-40 MG tablet   Encounter for health maintenance examination with abnormal findings - Primary    No indication for Pap smear. Has upcoming mammogram. We'll proceed with Cologuard. Advised to follow-up with GI regarding chronic right lower quadrant pain. Tetanus up-to-date. Declines flu. Recent laboratory studies reviewed. ASCVD risk score 1.5%. Will hold off on statin therapy at this time.       Other Visit Diagnoses    Hot flashes       Relevant Orders   [facility]   Colon cancer screening       Relevant Orders   Cologuard      Meds ordered this encounter  Medications  . butalbital-acetaminophen-caffeine (FIORICET, ESGIC) 50-325-40 MG tablet    Sig: Take 1-2 tablets by mouth every 6 (six) hours as needed for headache. Do not exceed 6 tablets in 24 hours.    Dispense:  30 tablet    Refill:  0    Follow-up: Annually  [place]

## 2016-04-23 NOTE — Assessment & Plan Note (Signed)
New problem. Features of migraine. Treating with Fioricet.

## 2016-04-23 NOTE — Progress Notes (Signed)
Pre visit review using our clinic review tool, if applicable. No additional management support is needed unless otherwise documented below in the visit note. 

## 2016-04-23 NOTE — Patient Instructions (Signed)
Follow up with Dr. Gustavo Lah regarding your belly pain.  Use the medication as needed for headache.  Continue to watch your diet.  Follow up annually or sooner if needed.  Take care  Dr. Lacinda Axon   Health Maintenance, Female Adopting a healthy lifestyle and getting preventive care can go a long way to promote health and wellness. Talk with your health care provider about what schedule of regular examinations is right for you. This is a good chance for you to check in with your provider about disease prevention and staying healthy. In between checkups, there are plenty of things you can do on your own. Experts have done a lot of research about which lifestyle changes and preventive measures are most likely to keep you healthy. Ask your health care provider for more information. WEIGHT AND DIET  Eat a healthy diet  Be sure to include plenty of vegetables, fruits, low-fat dairy products, and lean protein.  Do not eat a lot of foods high in solid fats, added sugars, or salt.  Get regular exercise. This is one of the most important things you can do for your health.  Most adults should exercise for at least 150 minutes each week. The exercise should increase your heart rate and make you sweat (moderate-intensity exercise).  Most adults should also do strengthening exercises at least twice a week. This is in addition to the moderate-intensity exercise.  Maintain a healthy weight  Body mass index (BMI) is a measurement that can be used to identify possible weight problems. It estimates body fat based on height and weight. Your health care provider can help determine your BMI and help you achieve or maintain a healthy weight.  For females 50 years of age and older:   A BMI below 18.5 is considered underweight.  A BMI of 18.5 to 24.9 is normal.  A BMI of 25 to 29.9 is considered overweight.  A BMI of 30 and above is considered obese.  Watch levels of cholesterol and blood lipids  You  should start having your blood tested for lipids and cholesterol at 50 years of age, then have this test every 5 years.  You may need to have your cholesterol levels checked more often if:  Your lipid or cholesterol levels are high.  You are older than 50 years of age.  You are at high risk for heart disease.  CANCER SCREENING   Lung Cancer  Lung cancer screening is recommended for adults 50-20 years old who are at high risk for lung cancer because of a history of smoking.  A yearly low-dose CT scan of the lungs is recommended for people who:  Currently smoke.  Have quit within the past 15 years.  Have at least a 30-pack-year history of smoking. A pack year is smoking an average of one pack of cigarettes a day for 1 year.  Yearly screening should continue until it has been 15 years since you quit.  Yearly screening should stop if you develop a health problem that would prevent you from having lung cancer treatment.  Breast Cancer  Practice breast self-awareness. This means understanding how your breasts normally appear and feel.  It also means doing regular breast self-exams. Let your health care provider know about any changes, no matter how small.  If you are in your 50s or 30s, you should have a clinical breast exam (CBE) by a health care provider every 1-3 years as part of a regular health exam.  If you are  50 or older, have a CBE every year. Also consider having a breast X-ray (mammogram) every year.  If you have a family history of breast cancer, talk to your health care provider about genetic screening.  If you are at high risk for breast cancer, talk to your health care provider about having an MRI and a mammogram every year.  Breast cancer gene (BRCA) assessment is recommended for women who have family members with BRCA-related cancers. BRCA-related cancers include:  Breast.  Ovarian.  Tubal.  Peritoneal cancers.  Results of the assessment will determine  the need for genetic counseling and BRCA1 and BRCA2 testing. Cervical Cancer Your health care provider may recommend that you be screened regularly for cancer of the pelvic organs (ovaries, uterus, and vagina). This screening involves a pelvic examination, including checking for microscopic changes to the surface of your cervix (Pap test). You may be encouraged to have this screening done every 3 years, beginning at age 50.  For women ages 50-65, health care providers may recommend pelvic exams and Pap testing every 3 years, or they may recommend the Pap and pelvic exam, combined with testing for human papilloma virus (HPV), every 5 years. Some types of HPV increase your risk of cervical cancer. Testing for HPV may also be done on women of any age with unclear Pap test results.  Other health care providers may not recommend any screening for nonpregnant women who are considered low risk for pelvic cancer and who do not have symptoms. Ask your health care provider if a screening pelvic exam is right for you.  If you have had past treatment for cervical cancer or a condition that could lead to cancer, you need Pap tests and screening for cancer for at least 20 years after your treatment. If Pap tests have been discontinued, your risk factors (such as having a new sexual partner) need to be reassessed to determine if screening should resume. Some women have medical problems that increase the chance of getting cervical cancer. In these cases, your health care provider may recommend more frequent screening and Pap tests. Colorectal Cancer  This type of cancer can be detected and often prevented.  Routine colorectal cancer screening usually begins at 50 years of age and continues through 50 years of age.  Your health care provider may recommend screening at an earlier age if you have risk factors for colon cancer.  Your health care provider may also recommend using home test kits to check for hidden blood  in the stool.  A small camera at the end of a tube can be used to examine your colon directly (sigmoidoscopy or colonoscopy). This is done to check for the earliest forms of colorectal cancer.  Routine screening usually begins at age 61.  Direct examination of the colon should be repeated every 5-10 years through 50 years of age. However, you may need to be screened more often if early forms of precancerous polyps or small growths are found. Skin Cancer  Check your skin from head to toe regularly.  Tell your health care provider about any new moles or changes in moles, especially if there is a change in a mole's shape or color.  Also tell your health care provider if you have a mole that is larger than the size of a pencil eraser.  Always use sunscreen. Apply sunscreen liberally and repeatedly throughout the day.  Protect yourself by wearing long sleeves, pants, a wide-brimmed hat, and sunglasses whenever you are outside. HEART  DISEASE, DIABETES, AND HIGH BLOOD PRESSURE   High blood pressure causes heart disease and increases the risk of stroke. High blood pressure is more likely to develop in:  People who have blood pressure in the high end of the normal range (130-139/85-89 mm Hg).  People who are overweight or obese.  People who are African American.  If you are 69-71 years of age, have your blood pressure checked every 3-5 years. If you are 47 years of age or older, have your blood pressure checked every year. You should have your blood pressure measured twice--once when you are at a hospital or clinic, and once when you are not at a hospital or clinic. Record the average of the two measurements. To check your blood pressure when you are not at a hospital or clinic, you can use:  An automated blood pressure machine at a pharmacy.  A home blood pressure monitor.  If you are between 25 years and 92 years old, ask your health care provider if you should take aspirin to prevent  strokes.  Have regular diabetes screenings. This involves taking a blood sample to check your fasting blood sugar level.  If you are at a normal weight and have a low risk for diabetes, have this test once every three years after 50 years of age.  If you are overweight and have a high risk for diabetes, consider being tested at a younger age or more often. PREVENTING INFECTION  Hepatitis B  If you have a higher risk for hepatitis B, you should be screened for this virus. You are considered at high risk for hepatitis B if:  You were born in a country where hepatitis B is common. Ask your health care provider which countries are considered high risk.  Your parents were born in a high-risk country, and you have not been immunized against hepatitis B (hepatitis B vaccine).  You have HIV or AIDS.  You use needles to inject street drugs.  You live with someone who has hepatitis B.  You have had sex with someone who has hepatitis B.  You get hemodialysis treatment.  You take certain medicines for conditions, including cancer, organ transplantation, and autoimmune conditions. Hepatitis C  Blood testing is recommended for:  Everyone born from [birth year] through 1965.  Anyone with known risk factors for hepatitis C. Sexually transmitted infections (STIs)  You should be screened for sexually transmitted infections (STIs) including gonorrhea and chlamydia if:  You are sexually active and are younger than 50 years of age.  You are older than 50 years of age and your health care provider tells you that you are at risk for this type of infection.  Your sexual activity has changed since you were last screened and you are at an increased risk for chlamydia or gonorrhea. Ask your health care provider if you are at risk.  If you do not have HIV, but are at risk, it may be recommended that you take a prescription medicine daily to prevent HIV infection. This is called pre-exposure prophylaxis  (PrEP). You are considered at risk if:  You are sexually active and do not regularly use condoms or know the HIV status of your partner(s).  You take drugs by injection.  You are sexually active with a partner who has HIV. Talk with your health care provider about whether you are at high risk of being infected with HIV. If you choose to begin PrEP, you should first be tested for HIV. You should  then be tested every 3 months for as long as you are taking PrEP.  PREGNANCY   If you are premenopausal and you may become pregnant, ask your health care provider about preconception counseling.  If you may become pregnant, take 400 to 800 micrograms (mcg) of folic acid every day.  If you want to prevent pregnancy, talk to your health care provider about birth control (contraception). OSTEOPOROSIS AND MENOPAUSE   Osteoporosis is a disease in which the bones lose minerals and strength with aging. This can result in serious bone fractures. Your risk for osteoporosis can be identified using a bone density scan.  If you are 71 years of age or older, or if you are at risk for osteoporosis and fractures, ask your health care provider if you should be screened.  Ask your health care provider whether you should take a calcium or vitamin D supplement to lower your risk for osteoporosis.  Menopause may have certain physical symptoms and risks.  Hormone replacement therapy may reduce some of these symptoms and risks. Talk to your health care provider about whether hormone replacement therapy is right for you.  HOME CARE INSTRUCTIONS   Schedule regular health, dental, and eye exams.  Stay current with your immunizations.   Do not use any tobacco products including cigarettes, chewing tobacco, or electronic cigarettes.  If you are pregnant, do not drink alcohol.  If you are breastfeeding, limit how much and how often you drink alcohol.  Limit alcohol intake to no more than 1 drink per day for  nonpregnant women. One drink equals 12 ounces of beer, 5 ounces of wine, or 1 ounces of hard liquor.  Do not use street drugs.  Do not share needles.  Ask your health care provider for help if you need support or information about quitting drugs.  Tell your health care provider if you often feel depressed.  Tell your health care provider if you have ever been abused or do not feel safe at home.   This information is not intended to replace advice given to you by your health care provider. Make sure you discuss any questions you have with your health care provider.   Document Released: 01/04/2011 Document Revised: 07/12/2014 Document Reviewed: 05/23/2013 Elsevier Interactive Patient Education [employer].

## 2016-04-26 ENCOUNTER — Ambulatory Visit
Admission: RE | Admit: 2016-04-26 | Discharge: 2016-04-26 | Disposition: A | Discharge status: Home / Self Care | Payer: PRIVATE HEALTH INSURANCE | Source: Ambulatory Visit | Attending: General Surgery | Admitting: General Surgery

## 2016-04-26 DIAGNOSIS — Z1231 Encounter for screening mammogram for malignant neoplasm of breast: Secondary | ICD-10-CM | POA: Diagnosis not present

## 2016-05-06 ENCOUNTER — Ambulatory Visit: Payer: PRIVATE HEALTH INSURANCE | Admitting: General Surgery

## 2016-08-08 IMAGING — MG MM DIGITAL SCREENING BILATERAL
5 series · 5 of 5 positions shown · non-contrast
Comparison: Previous exam(s).

CLINICAL DATA: Screening.

EXAM:
DIGITAL SCREENING BILATERAL MAMMOGRAM WITH CAD

[L CC]
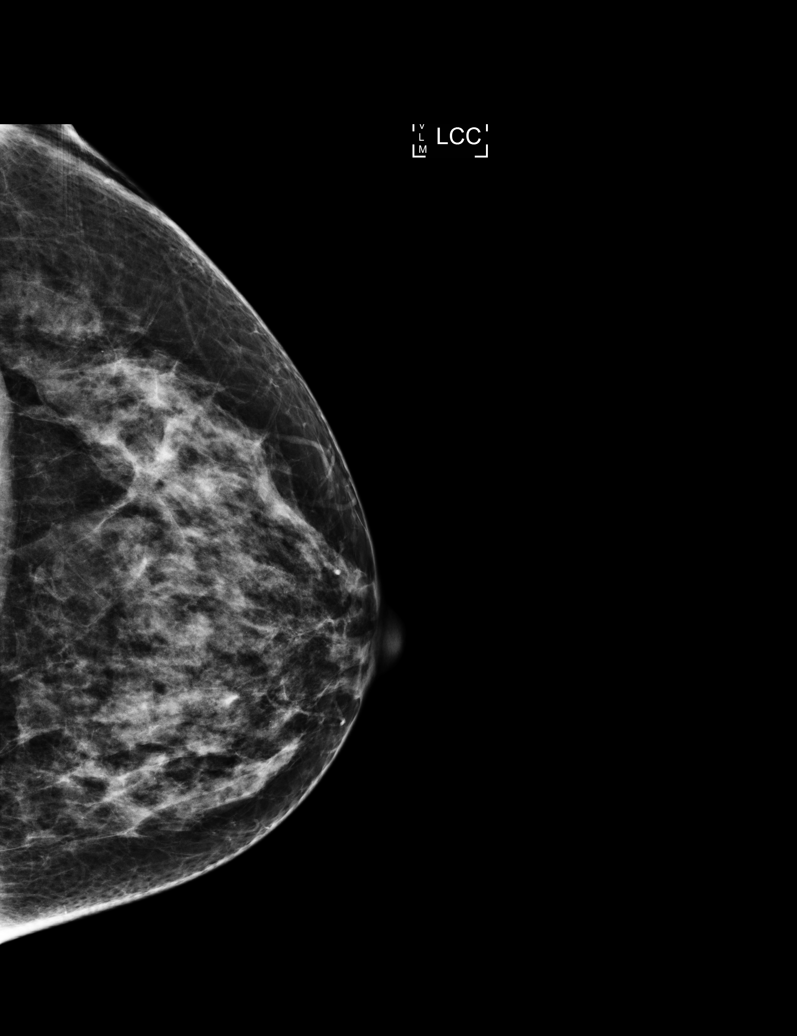

[R CC (1 of 2)]
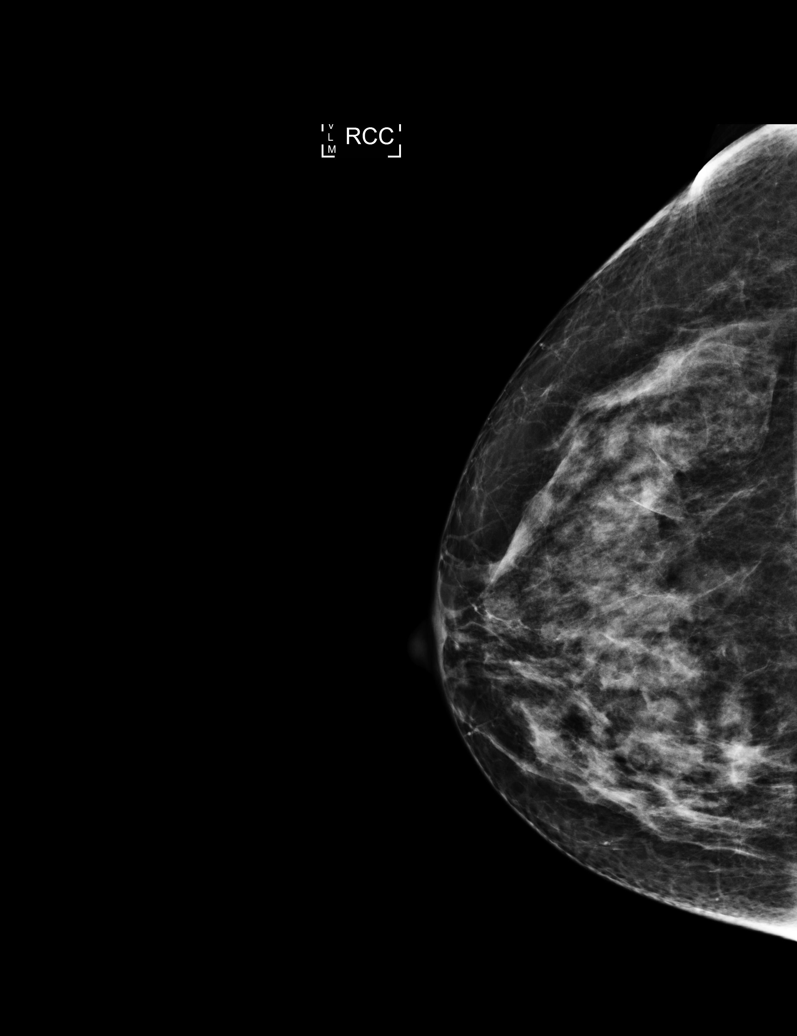

[R MLO]
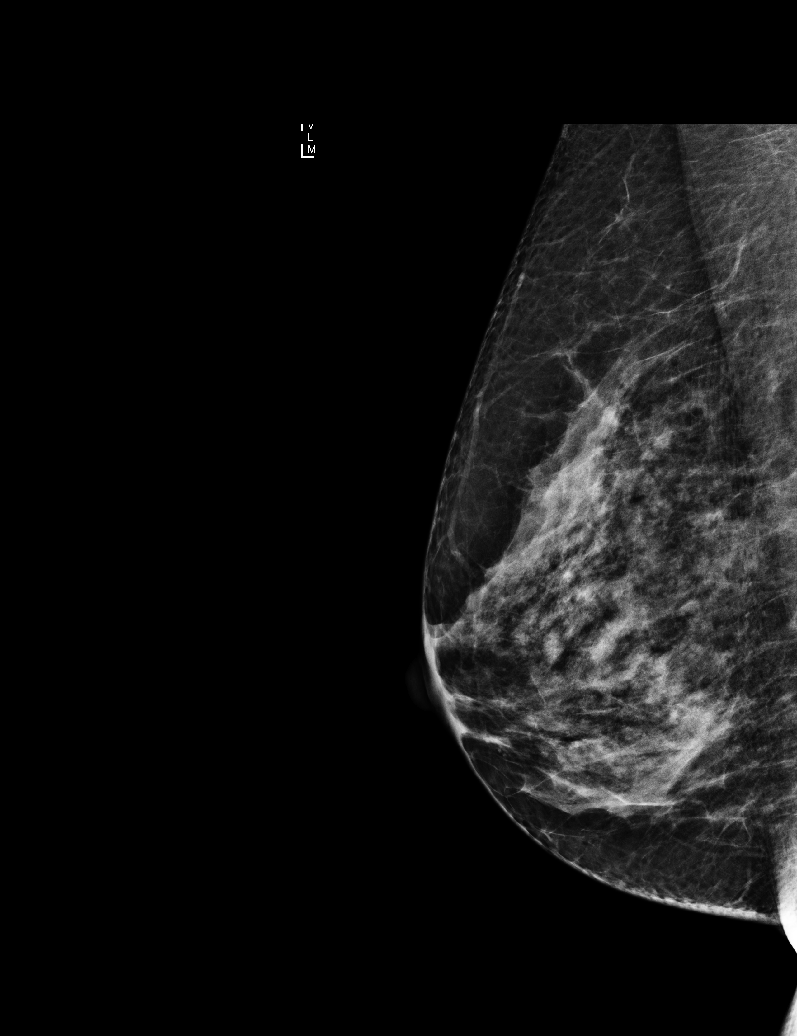

[L MLO]
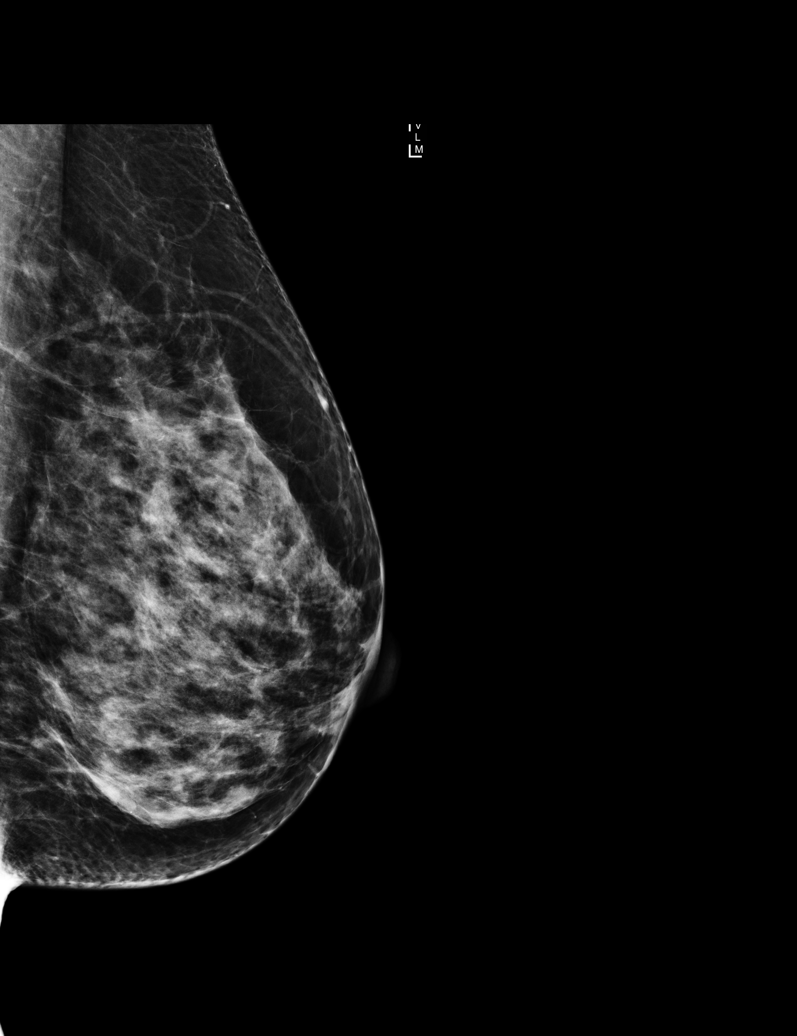

[R CC (2 of 2)]
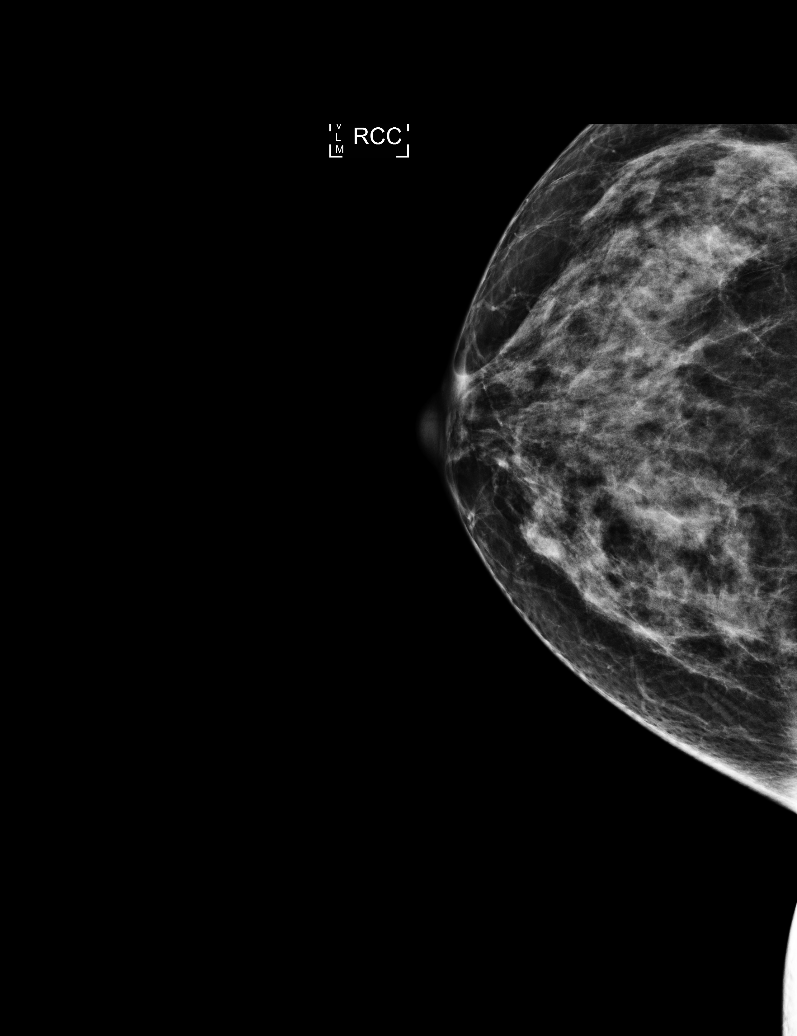

[5 of 5 positions shown; findings below may reference images not displayed]

ACR Breast Density Category c: The breast tissue is heterogeneously
dense, which may obscure small masses.
FINDINGS: There are no findings suspicious for malignancy. Images were
processed with CAD.
IMPRESSION: No mammographic evidence of malignancy. A result letter of this
screening mammogram will be mailed directly to the patient.

RECOMMENDATION:
Screening mammogram in one year. (Code:[id number])

BI-RADS CATEGORY  1: Negative.

## 2016-08-31 ENCOUNTER — Other Ambulatory Visit: Payer: Self-pay | Admitting: Family Medicine

## 2016-09-01 NOTE — Telephone Encounter (Signed)
Last OV 04/23/16 last filled by Dr.Sonnenberg 08/29/15 30 11rf

## 2016-09-02 ENCOUNTER — Encounter: Payer: Self-pay | Admitting: *Deleted

## 2016-09-08 ENCOUNTER — Ambulatory Visit (INDEPENDENT_AMBULATORY_CARE_PROVIDER_SITE_OTHER): Payer: PRIVATE HEALTH INSURANCE | Admitting: General Surgery

## 2016-09-08 ENCOUNTER — Encounter: Payer: Self-pay | Admitting: General Surgery

## 2016-09-08 ENCOUNTER — Inpatient Hospital Stay: Payer: Self-pay

## 2016-09-08 VITALS — BP 118/78 | HR 69 | Resp 13 | Ht 62.0 in | Wt 149.0 lb

## 2016-09-08 DIAGNOSIS — Z803 Family history of malignant neoplasm of breast: Secondary | ICD-10-CM | POA: Diagnosis not present

## 2016-09-08 DIAGNOSIS — N6459 Other signs and symptoms in breast: Secondary | ICD-10-CM

## 2016-09-08 DIAGNOSIS — N6019 Diffuse cystic mastopathy of unspecified breast: Secondary | ICD-10-CM

## 2016-09-08 NOTE — Patient Instructions (Addendum)
Patient will be asked to return here in October with a bilateral screening mammogram. Call with any questions or problems.

## 2016-09-08 NOTE — Progress Notes (Signed)
Patient ID: Isabella Robertson, female   DOB: 1965/10/14, 51 y.o.   MRN: [id number]  Chief Complaint  Patient presents with  . Follow-up    HPI Isabella Robertson is a 51 y.o. female who presents for a breast evaluation. The most recent mammogram was done on 04/26/2016. She was unable to schedule an appointment for follow up until now due to job related scheduling conflicts. Patient does perform regular self breast checks and gets regular mammograms done. She reports no new breast problems. I have reviewed the history of present illness with the patient.     HPI  Past Medical History:  Diagnosis Date  . Chronic abdominal pain   . Diffuse cystic mastopathy   . Diverticulitis   . Endometriosis    History of; Now s/p hysterectomy.  . IBS (irritable bowel syndrome)     Past Surgical History:  Procedure Laterality Date  . ABDOMINAL HYSTERECTOMY  2007  . CESAREAN SECTION  2003  . COLONOSCOPY  2010  . ECTOPIC PREGNANCY SURGERY  [id number]  . EYE SURGERY  2003  . HERNIA REPAIR  age 64    Family History  Problem Relation Age of Onset  . Breast cancer Paternal Aunt   . Breast cancer Mother 23  . Breast cancer Cousin     breast - paternal side    Social History Social History  Substance Use Topics  . Smoking status: Never Smoker  . Smokeless tobacco: Never Used  . Alcohol use No    No Known Allergies  Current Outpatient Prescriptions  Medication Sig Dispense Refill  . butalbital-acetaminophen-caffeine (FIORICET, ESGIC) 50-325-40 MG tablet Take 1-2 tablets by mouth every 6 (six) hours as needed for headache. Do not exceed 6 tablets in 24 hours. 30 tablet 0  . fluticasone (FLONASE) 50 MCG/ACT nasal spray Place 2 sprays into both nostrils daily. 16 g 6  . loratadine (CLARITIN) 10 MG tablet TAKE 1 TABLET (10 MG TOTAL) BY MOUTH DAILY. 30 tablet 2  . omeprazole (PRILOSEC) 20 MG capsule TAKE 1 CAPSULE (20 MG TOTAL) BY MOUTH DAILY. NEEDS OFFICE VISIT FOR FURTHER REFILLS 30  capsule 0   No current facility-administered medications for this visit.     Review of Systems Review of Systems  Constitutional: Negative.   Respiratory: Negative.   Cardiovascular: Negative.     Blood pressure 118/78, pulse 69, resp. rate 13, height 5\' 2"  (1.575 m), weight 149 lb (67.6 kg).  Physical Exam Physical Exam  Constitutional: She is oriented to person, place, and time. She appears well-developed and well-nourished.  Eyes: Conjunctivae are normal. No scleral icterus.  Neck: Neck supple.  Cardiovascular: Normal rate, regular rhythm and normal heart sounds.   Pulmonary/Chest: Effort normal and breath sounds normal. Right breast exhibits no inverted nipple, no mass, no nipple discharge, no skin change and no tenderness. Left breast exhibits tenderness. Left breast exhibits no inverted nipple, no mass, no nipple discharge and no skin change.  Left breast there is a subtle 4 cm long ridge of thickening 2-3 cm from the nipple at 6 o'clk, transversely oriented  Abdominal: Soft. Bowel sounds are normal. There is no tenderness.  Lymphadenopathy:    She has no cervical adenopathy.  Neurological: She is alert and oriented to person, place, and time.  Skin: Skin is warm and dry.  Psychiatric: She has a normal mood and affect.    Data Reviewed Mammogram reviewed and stable  Targeted US over palpable ridge of tissue at 6 ocl  left breast 3cmFN shows no defined mass or cyst. There is mild prominence of fibroglandular tissue. These findings are benign Assessment    Pt has had prior breast cysts. Has FH of breast cancer. 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    This information has been scribed by Gaspar Cola CMA.   Irby Fails G 09/08/2016, 1:57 PM

## 2016-10-26 ENCOUNTER — Ambulatory Visit: Payer: Self-pay | Admitting: Registered Nurse

## 2016-10-26 VITALS — BP 116/82 | HR 92 | Temp 98.1°F

## 2016-10-26 DIAGNOSIS — J309 Allergic rhinitis, unspecified: Secondary | ICD-10-CM

## 2016-10-26 DIAGNOSIS — J209 Acute bronchitis, unspecified: Secondary | ICD-10-CM

## 2016-10-26 DIAGNOSIS — H1013 Acute atopic conjunctivitis, bilateral: Secondary | ICD-10-CM

## 2016-10-26 DIAGNOSIS — J0111 Acute recurrent frontal sinusitis: Secondary | ICD-10-CM

## 2016-10-26 MED ORDER — SALINE SPRAY 0.65 % NA SOLN: 2.0000 | NASAL | 0 refills | Status: DC

## 2016-10-26 MED ORDER — CETIRIZINE HCL 10 MG PO TABS: 10.0000 mg | ORAL_TABLET | Freq: Every day | ORAL | 11 refills | Status: DC

## 2016-10-26 MED ORDER — AMOXICILLIN 875 MG PO TABS: 875.0000 mg | ORAL_TABLET | Freq: Two times a day (BID) | ORAL | 0 refills | Status: DC

## 2016-10-26 MED ORDER — NAPHAZOLINE-PHENIRAMINE 0.025-0.3 % OP SOLN: 1.0000 [drp] | Freq: Two times a day (BID) | OPHTHALMIC | 1 refills | Status: AC | PRN

## 2016-10-26 MED ORDER — ALBUTEROL SULFATE HFA 108 (90 BASE) MCG/ACT IN AERS: 1.0000 | INHALATION_SPRAY | RESPIRATORY_TRACT | 0 refills | Status: DC | PRN

## 2016-10-26 MED ORDER — BENZONATATE 200 MG PO CAPS: 200.0000 mg | ORAL_CAPSULE | Freq: Two times a day (BID) | ORAL | 0 refills | Status: DC | PRN

## 2016-10-26 MED ORDER — CARBOXYMETHYLCELLULOSE SODIUM 1 % OP SOLN: 1.0000 [drp] | Freq: Three times a day (TID) | OPHTHALMIC | 12 refills | Status: AC | PRN

## 2016-10-26 NOTE — Progress Notes (Signed)
Subjective:    Patient ID: Isabella Robertson, female    DOB: 1966/05/24, 51 y.o.   MRN: [id number]  50y/o hispanic female here with productive cough, HA, sinus pain/pressure. RN saw pt 4/20 and gave saline spray, phenylephrine, acetaminophen from clinic stock. Had Flonase at home. Using all of these and feels worse today. Now with bilateral otalgia, L>R. Muffled sounds, itchy inside, pressure. Denied ear drainage. Last sinus infection  Jul 11th, 2017 treated with amoxicillin and flonase with good relief.  Has flex spending would like Rx for zyrtec, refill albuterol inhaler as expired, refresh drops and allergy eye drops      Review of Systems  Constitutional: Negative for activity change, appetite change, chills, diaphoresis, fatigue, fever and unexpected weight change.  HENT: Positive for congestion, ear pain, hearing loss, postnasal drip, rhinorrhea, sinus pain, sinus pressure and sneezing. Negative for dental problem, drooling, ear discharge, facial swelling, mouth sores, nosebleeds, sore throat, tinnitus, trouble swallowing and voice change.   Eyes: Positive for redness and itching. Negative for photophobia, pain, discharge and visual disturbance.  Respiratory: Positive for cough. Negative for choking, chest tightness, shortness of breath, wheezing and stridor.   Cardiovascular: Negative for chest pain, palpitations and leg swelling.  Gastrointestinal: Negative for abdominal distention, abdominal pain, blood in stool, constipation, diarrhea, nausea and vomiting.  Endocrine: Negative for cold intolerance and heat intolerance.  Genitourinary: Negative for difficulty urinating, dysuria and hematuria.  Musculoskeletal: Negative for arthralgias, back pain, gait problem, joint swelling, myalgias, neck pain and neck stiffness.  Skin: Negative for color change, pallor, rash and wound.  Allergic/Immunologic: Positive for environmental allergies. Negative for food allergies.  Neurological:  Positive for headaches. Negative for dizziness, tremors, seizures, syncope, facial asymmetry, speech difficulty, weakness, light-headedness and numbness.  Hematological: Negative for adenopathy. Does not bruise/bleed easily.  Psychiatric/Behavioral: Negative for agitation, behavioral problems, confusion and sleep disturbance.       Objective:   Physical Exam  Constitutional: She is oriented to person, place, and time. Vital signs are normal. She appears well-developed and well-nourished. She is active and cooperative.  Non-toxic appearance. She does not have a sickly appearance. She appears ill. No distress.  HENT:  Head: Normocephalic and atraumatic.  Right Ear: Hearing, external ear and ear canal normal. A middle ear effusion is present.  Left Ear: Hearing, external ear and ear canal normal. A middle ear effusion is present.  Nose: Mucosal edema and rhinorrhea present. No nose lacerations, sinus tenderness, nasal deformity, septal deviation or nasal septal hematoma. No epistaxis.  No foreign bodies. Right sinus exhibits frontal sinus tenderness. Right sinus exhibits no maxillary sinus tenderness. Left sinus exhibits frontal sinus tenderness. Left sinus exhibits no maxillary sinus tenderness.  Mouth/Throat: Uvula is midline and mucous membranes are normal. Mucous membranes are not pale, not dry and not cyanotic. She does not have dentures. No oral lesions. No trismus in the jaw. Normal dentition. No dental abscesses, uvula swelling, lacerations or dental caries. Posterior oropharyngeal edema and posterior oropharyngeal erythema present. No oropharyngeal exudate or tonsillar abscesses.  Bilateral allergic shiners; bilateral TMs air fluid level clear; nasal turbinates edema/erythema thick clear yellow discharge bilaterally; cobblestoning posterior pharynx  Eyes: EOM and lids are normal. Pupils are equal, round, and reactive to light. Right eye exhibits no chemosis, no discharge, no exudate and no  hordeolum. No foreign body present in the right eye. Left eye exhibits no chemosis, no discharge, no exudate and no hordeolum. No foreign body present in the left eye. Right  conjunctiva is injected. Right conjunctiva has no hemorrhage. Left conjunctiva is injected. Left conjunctiva has no hemorrhage. No scleral icterus. Right eye exhibits normal extraocular motion and no nystagmus. Left eye exhibits normal extraocular motion and no nystagmus. Right pupil is round and reactive. Left pupil is round and reactive. Pupils are equal.  Neck: Trachea normal and normal range of motion. Neck supple. No tracheal tenderness, no spinous process tenderness and no muscular tenderness present. No neck rigidity. No tracheal deviation, no edema, no erythema and normal range of motion present. No thyroid mass and no thyromegaly present.  Cardiovascular: Normal rate, regular rhythm, S1 normal, S2 normal, normal heart sounds and intact distal pulses.  PMI is not displaced.  Exam reveals no gallop and no friction rub.   No murmur heard. Pulmonary/Chest: Effort normal and breath sounds normal. No accessory muscle usage or stridor. No respiratory distress. She has no decreased breath sounds. She has no wheezes. She has no rhonchi. She has no rales. She exhibits no tenderness.  Intermittent nonproductive cough observed in exam room;hoarse voice; able to speak in full sentences; no wheezing noted; sp02 95-97% room air when speaking  Abdominal: Soft. Normal appearance. She exhibits no distension. There is no rigidity and no guarding.  Musculoskeletal: Normal range of motion. She exhibits no edema or tenderness.       Right shoulder: Normal.       Left shoulder: Normal.       Right hip: Normal.       Left hip: Normal.       Right knee: Normal.       Left knee: Normal.       Cervical back: Normal.       Right hand: Normal.       Left hand: Normal.  Lymphadenopathy:       Head (right side): No submental, no submandibular, no  tonsillar, no preauricular, no posterior auricular and no occipital adenopathy present.       Head (left side): No submental, no submandibular, no tonsillar, no preauricular, no posterior auricular and no occipital adenopathy present.    She has no cervical adenopathy.       Right cervical: No superficial cervical, no deep cervical and no posterior cervical adenopathy present.      Left cervical: No superficial cervical, no deep cervical and no posterior cervical adenopathy present.  Neurological: She is alert and oriented to person, place, and time. She has normal strength. She is not disoriented. She displays no atrophy and no tremor. No cranial nerve deficit or sensory deficit. She exhibits normal muscle tone. She displays no seizure activity. Coordination and gait normal. GCS eye subscore is 4. GCS verbal subscore is 5. GCS motor subscore is 6.  Skin: Skin is warm, dry and intact. No abrasion, no bruising, no burn, no ecchymosis, no laceration, no lesion, no petechiae and no rash noted. She is not diaphoretic. No cyanosis or erythema. No pallor. Nails show no clubbing.  Psychiatric: She has a normal mood and affect. Her speech is normal and behavior is normal. Judgment and thought content normal. Cognition and memory are normal.  Nursing note and vitals reviewed.         Assessment & Plan:  A-refilled albuterol MDI 51mcg 1-2 puffs po q4-6h prn wheeze/protracted cough/chest tightness #1 RF0, zyrtec 10mg  po daily #30 RF11 as FSA Refresh gtts 2 ou TID prn eye itching/irritation #1 27ml RF12; allergic opthalmic ou BID prn Rx renewed #1 RF1 for FSA Dispensed amoxicillin  875mg  po BID x 10 days #20 RF0 from [facility]. Continue nasal saline 2 sprays each notril q2h wa at home.  Tessalon pearles 200mg  po TID prn cough and/or cough lozenges po q2h prn cough.evidence of systemic bacterial infection, non toxic and well hydrated.  I do not see where any further testing or imaging is necessary at this time.   I  will suggest supportive care, rest, good hygiene and encourage the patient to take adequate fluids.  The patient is to return to clinic or EMERGENCY ROOM if symptoms worsen or change significantly.  Exitcare handout on sinusitis given to patient.  Patient verbalized agreement and understanding of treatment plan and had no further questions at this time.   P2:  Hand washing and cover cough   Sinusitis and seasonal allergies probably started Bronchitis simple.  If no improvement with treatment of sinusitis will consider oral steroids.  Rx tessalon and albuterol MDI see above.  Viruses are the most common cause of bronchial inflammation in otherwise healthy adults with acute bronchitis.  The appearance of sputum is not predictive of whether a bacterial infection is present.  Purulent sputum is most often caused by viral infections.  There are a small portion of those caused by non-viral agents being Mycoplamsa pneumonia.  Microscopic examination or C&S of sputum in the healthy adult with acute bronchitis is generally not helpful (usually negative or normal respiratory flora) other considerations being cough from upper respiratory tract infections, sinusitis or allergic syndromes (mild asthma or viral pneumonia).  Differential Diagnosis:  reactive airway disease (asthma, allergic aspergillosis (eosinophilia), chronic bronchitis, respiratory infection (Sinusitis, Common cold, pneumonia), congestive heart failure, reflux esophagitis, bronchogenic tumor, aspiration syndromes and/or exposure irritants/tobacco smoke.  In this case, there is no evidence of any invasive bacterial illness.  Most likely allergic/viral etiology so will hold on antibiotic treatment.  Advise supportive care with rest, encourage fluids, good hygiene and watch for any worsening symptoms.  If they were to develop:  come back to the office or go to the emergency room if after hours. Without high fever, severe dyspnea, lack of physical findings or  other risk factors, I will hold on a chest radiograph and CBC at this time. I discussed that approximately 50% of patients with acute bronchitis have a cough that lasts up to three weeks, and 25% for over a month.  Tylenol, one to two tablets every four hours as needed for fever or myalgias.   No aspirin.  Patient instructed to follow up in one week or sooner if symptoms worsen. Patient verbalized agreement and understanding of treatment plan.  P2:  hand washing and cover cough   Patient may use normal saline nasal spray as needed.  Continue antihistamine and nasal steroid use. Consider adding singulair 10mg  po daily.  Avoid triggers if possible.  Shower prior to bedtime if exposed to triggers.  If allergic dust/dust mites recommend mattress/pillow covers/encasements; washing linens, vacuuming, sweeping, dusting weekly.  Call or return to clinic as needed if these symptoms worsen or fail to improve as anticipated.   Exitcare handout on allergic rhinitis given to patient.  Patient verbalized understanding of instructions, agreed with plan of care and had no further questions at this time.

## 2016-10-26 NOTE — Patient Instructions (Addendum)
Patient to stop claritin if restarting zyrtec 10mg  by mouth daily Continue nasal saline 2 sprays each nostril every 2 hours while awake if congested and aggressive use in shower as sinus rinse twice a day flonase 1 spray each nostril twice a day Start amoxicillin 875mg  by mouth twice a day for 10 days Phenylephrine 5mg  by mouth every 6 hours as needed for runny nose Tessalon pearles 200mg  by mouth every 8 hours as needed for cough Cough lozenges by mouth every 2 hours as needed for cough Albuterol inhaler 1-2 puffs by mouth every 4-6 hours as needed for chest tightness/wheezing/protracted coughing Refresh plus drops each eye 2 three times a day as needed for itching/dryness May continue OTC allergy eye drops per manufacturer's instructions both eyes as needed itching  How to Use a Metered Dose Inhaler A metered dose inhaler is a handheld device for taking medicine that must be breathed into the lungs (inhaled). The device can be used to deliver a variety of inhaled medicines, including:  Quick relief or rescue medicines, such as bronchodilators.  Controller medicines, such as corticosteroids. The medicine is delivered by pushing down on a metal canister to release a preset amount of spray and medicine. Each device contains the amount of medicine that is needed for a preset number of uses (inhalations). Your health care provider may recommend that you use a spacer with your inhaler to help you take the medicine more effectively. A spacer is a plastic tube with a mouthpiece on one end and an opening that connects to the inhaler on the other end. A spacer holds the medicine in a tube for a short time, which allows you to inhale more medicine. What are the risks? If you do not use your inhaler correctly, medicine might not reach your lungs to help you breathe. Inhaler medicine can cause side effects, such as:  Mouth or throat infection.  Cough.  Hoarseness.  Headache.  Nausea and  vomiting.  Lung infection (pneumonia) in people who have a lung condition called COPD. How to use a metered dose inhaler without a spacer 1. Remove the cap from the inhaler. 2. If you are using the inhaler for the first time, shake it for 5 seconds, turn it away from your face, then release 4 puffs into the air. This is called priming. 3. Shake the inhaler for 5 seconds. 4. Position the inhaler so the top of the canister faces up. 5. Put your index finger on the top of the medicine canister. Support the bottom of the inhaler with your thumb. 6. Breathe out normally and as completely as possible, away from the inhaler. 7. Either place the inhaler between your teeth and close your lips tightly around the mouthpiece, or hold the inhaler 1-2 inches (2.5-5 cm) away from your open mouth. Keep your tongue down out of the way. If you are unsure which technique to use, ask your health care provider. 8. Press the canister down with your index finger to release the medicine, then inhale deeply and slowly through your mouth (not your nose) until your lungs are completely filled. Inhaling should take 4-6 seconds. 9. Hold the medicine in your lungs for 5-10 seconds (10 seconds is best). This helps the medicine get into the small airways of your lungs. 10. With your lips in a tight circle (pursed), breathe out slowly. 11. Repeat steps 3-10 until you have taken the number of puffs that your health care provider directed. Wait about 1 minute between puffs or  as directed. 12. Put the cap on the inhaler. 13. If you are using a steroid inhaler, rinse your mouth with water, gargle, and spit out the water. Do not swallow the water. How to use a metered dose inhaler with a spacer 1. Remove the cap from the inhaler. 2. If you are using the inhaler for the first time, shake it for 5 seconds, turn it away from your face, then release 4 puffs into the air. This is called priming. 3. Shake the inhaler for 5  seconds. 4. Place the open end of the spacer onto the inhaler mouthpiece. 5. Position the inhaler so the top of the canister faces up and the spacer mouthpiece faces you. 6. Put your index finger on the top of the medicine canister. Support the bottom of the inhaler and the spacer with your thumb. 7. Breathe out normally and as completely as possible, away from the spacer. 8. Place the spacer between your teeth and close your lips tightly around it. Keep your tongue down out of the way. 9. Press the canister down with your index finger to release the medicine, then inhale deeply and slowly through your mouth (not your nose) until your lungs are completely filled. Inhaling should take 4-6 seconds. 10. Hold the medicine in your lungs for 5-10 seconds (10 seconds is best). This helps the medicine get into the small airways of your lungs. 11. With your lips in a tight circle (pursed), breathe out slowly. 12. Repeat steps 3-11 until you have taken the number of puffs that your health care provider directed. Wait about 1 minute between puffs or as directed. 13. Remove the spacer from the inhaler and put the cap on the inhaler. 14. If you are using a steroid inhaler, rinse your mouth with water, gargle, and spit out the water. Do not swallow the water. Follow these instructions at home:  Take your inhaled medicine only as told by your health care provider. Do not use the inhaler more than directed by your health care provider.  Keep all follow-up visits as told by your health care provider. This is important.  If your inhaler has a counter, you can check it to determine how full your inhaler is. If your inhaler does not have a counter, ask your health care provider when you will need to refill your inhaler and write the refill date on a calendar or on your inhaler canister. Note that you cannot know when an inhaler is empty by shaking it.  Follow directions on the package insert for care and cleaning of  your inhaler and spacer. Contact a health care provider if:  Symptoms are only partially relieved with your inhaler.  You are having trouble using your inhaler.  You have an increase in phlegm.  You have headaches. Get help right away if:  You feel little or no relief after using your inhaler.  You have dizziness.  You have a fast heart rate.  You have chills or a fever.  You have night sweats.  There is blood in your phlegm. Summary  A metered dose inhaler is a handheld device for taking medicine that must be breathed into the lungs (inhaled).  The medicine is delivered by pushing down on a metal canister to release a preset amount of spray and medicine.  Each device contains the amount of medicine that is needed for a preset number of uses (inhalations). This information is not intended to replace advice given to you by your health  care provider. Make sure you discuss any questions you have with your health care provider. Document Released: 06/21/2005 Document Revised: 05/11/2016 Document Reviewed: 05/11/2016 Elsevier Interactive Patient Education  2017 [place].    Allergic Rhinitis Allergic rhinitis is when the mucous membranes in the nose respond to allergens. Allergens are particles in the air that cause your body to have an allergic reaction. This causes you to release allergic antibodies. Through a chain of events, these eventually cause you to release histamine into the blood stream. Although meant to protect the body, it is this release of histamine that causes your discomfort, such as frequent sneezing, congestion, and an itchy, runny nose. What are the causes? Seasonal allergic rhinitis (hay fever) is caused by pollen allergens that may come from grasses, trees, and weeds. Year-round allergic rhinitis (perennial allergic rhinitis) is caused by allergens such as house dust mites, pet dander, and mold spores. What are the signs or symptoms?  Nasal stuffiness  (congestion).  Itchy, runny nose with sneezing and tearing of the eyes. How is this diagnosed? Your health care provider can help you determine the allergen or allergens that trigger your symptoms. If you and your health care provider are unable to determine the allergen, skin or blood testing may be used. Your health care provider will diagnose your condition after taking your health history and performing a physical exam. Your health care provider may assess you for other related conditions, such as asthma, pink eye, or an ear infection. How is this treated? Allergic rhinitis does not have a cure, but it can be controlled by:  Medicines that block allergy symptoms. These may include allergy shots, nasal sprays, and oral antihistamines.  Avoiding the allergen. Hay fever may often be treated with antihistamines in pill or nasal spray forms. Antihistamines block the effects of histamine. There are over-the-counter medicines that may help with nasal congestion and swelling around the eyes. Check with your health care provider before taking or giving this medicine. If avoiding the allergen or the medicine prescribed do not work, there are many new medicines your health care provider can prescribe. Stronger medicine may be used if initial measures are ineffective. Desensitizing injections can be used if medicine and avoidance does not work. Desensitization is when a patient is given ongoing shots until the body becomes less sensitive to the allergen. Make sure you follow up with your health care provider if problems continue. Follow these instructions at home: It is not possible to completely avoid allergens, but you can reduce your symptoms by taking steps to limit your exposure to them. It helps to know exactly what you are allergic to so that you can avoid your specific triggers. Contact a health care provider if:  You have a fever.  You develop a cough that does not stop easily  (persistent).  You have shortness of breath.  You start wheezing.  Symptoms interfere with normal daily activities. This information is not intended to replace advice given to you by your health care provider. Make sure you discuss any questions you have with your health care provider. Document Released: 03/16/2001 Document Revised: 02/20/2016 Document Reviewed: 02/26/2013 Elsevier Interactive Patient Education  2017 [place]. Otitis Media With Effusion, Pediatric Otitis media with effusion (OME) occurs when there is inflammation of the middle ear and fluid in the middle ear space. There are no signs and symptoms of infection. The middle ear space contains air and the bones for hearing. Air in the middle ear space helps to  transmit sound to the brain. OME is a common condition in children, and it often occurs after an ear infection. This condition may be present for several weeks or longer after an ear infection. Most cases of this condition get better on their own. What are the causes? OME is caused by a blockage of the eustachian tube in one or both ears. These tubes drain fluid in the ears to the back of the nose (nasopharynx). If the tissue in the tube swells up (edema), the tube closes. This prevents fluid from draining. Blockage can be caused by:  Ear infections.  Colds and other upper respiratory infections.  Allergies.  Irritants, such as tobacco smoke.  Enlarged adenoids. The adenoids are areas of soft tissue located high in the back of the throat, behind the nose and the roof of the mouth. They are part of the body's natural defense (immune) system.  A mass in the nasopharynx.  Damage to the ear caused by pressure changes (barotrauma). What increases the risk? Your child is more likely to develop this condition if:  He or she has repeated ear and sinus infections.  He or she has allergies.  He or she is exposed to tobacco smoke.  He or she attends daycare.  He  or she is not breastfed. What are the signs or symptoms? Symptoms of this condition may not be obvious. Sometimes this condition does not have any symptoms, or symptoms may overlap with those of a cold or upper respiratory tract illness. Symptoms of this condition include:  Temporary hearing loss.  A feeling of fullness in the ear without pain.  Irritability or agitation.  Balance (vestibular) problems. As a result of hearing loss, your child may:  Listen to the TV at a loud volume.  Not respond to questions.  Ask "What?" often when spoken to.  Mistake or confuse one sound or word for another.  Perform poorly at school.  Have a poor attention span.  Become agitated or irritated easily. How is this diagnosed? This condition is diagnosed with an ear exam. Your child's health care provider will look inside your child's ear with an instrument (otoscope) to check for redness, swelling, and fluid. Other tests may be done, including:  A test to check the movement of the eardrum (pneumatic otoscopy). This is done by squeezing a small amount of air into the ear.  A test that changes air pressure in the middle ear to check how well the eardrum moves and to see if the eustachian tube is working (tympanogram).  Hearing test (audiogram). This test involves playing tones at different pitches to see if your child can hear each tone. How is this treated? Treatment for this condition depends on the cause. In many cases, the fluid goes away on its own. In some cases, your child may need a procedure to create a hole in the eardrum to allow fluid to drain (myringotomy) and to insert small drainage tubes (tympanostomy tubes) into the eardrums. These tubes help to drain fluid and prevent infection. This procedure may be recommended if:  OME does not get better over several months.  Your child has many ear infections within several months.  Your child has noticeable hearing loss.  Your child  has problems with speech and language development. Surgery may also be done to remove the adenoids (adenoidectomy). Follow these instructions at home:  Give over-the-counter and prescription medicines only as told by your child's health care provider.  Keep children away from any  tobacco smoke.  Keep all follow-up visits as told by your child's health care provider. This is important. How is this prevented?  Keep your child's vaccinations up to date. Make sure your child gets all recommended vaccinations, including a pneumonia and flu vaccine.  Encourage hand washing. Your child should wash his or her hands often with soap and water. If there is no soap and water, he or she should use hand sanitizer.  Avoid exposing your child to tobacco smoke.  Breastfeed your baby, if possible. Babies who are breastfed as long as possible are less likely to develop this condition. Contact a health care provider if:  Your child's hearing does not get better after 3 months.  Your child's hearing is worse.  Your child has ear pain.  Your child has a fever.  Your child has drainage from the ear.  Your child is dizzy.  Your child has a lump on his or her neck. Get help right away if:  Your child has bleeding from the nose.  Your child cannot move part of her or his face.  Your child has trouble breathing.  Your child cannot smell.  Your child develops severe congestion.  Your child develops weakness.  Your child who is younger than 3 months has a temperature of 100F (38C) or higher. Summary  Otitis media with effusion (OME) occurs when there is inflammation of the middle ear and fluid in the middle ear space.  This condition is caused by blockage of one or both eustachian tubes, which drain fluid in the ears to the back of the nose.  Symptoms of this condition can include temporary hearing loss, a feeling of fullness in the ear, irritability or agitation, and balance (vestibular)  problems. Sometimes, there are no symptoms.  This condition is diagnosed with an ear exam and tests, such as pneumatic otoscopy, tympanogram, and audiogram.  Treatment for this condition depends on the cause. In many cases, the fluid goes away on its own. This information is not intended to replace advice given to you by your health care provider. Make sure you discuss any questions you have with your health care provider. Document Released: 09/11/2003 Document Revised: 05/13/2016 Document Reviewed: 05/13/2016 Elsevier Interactive Patient Education  2017 [employer] Acute Bronchitis, Adult Acute bronchitis is sudden (acute) swelling of the air tubes (bronchi) in the lungs. Acute bronchitis causes these tubes to fill with mucus, which can make it hard to breathe. It can also cause coughing or wheezing. In adults, acute bronchitis usually goes away within 2 weeks. A cough caused by bronchitis may last up to 3 weeks. Smoking, allergies, and asthma can make the condition worse. Repeated episodes of bronchitis may cause further lung problems, such as chronic obstructive pulmonary disease (COPD). What are the causes? This condition can be caused by germs and by substances that irritate the lungs, including:  Cold and flu viruses. This condition is most often caused by the same virus that causes a cold.  Bacteria.  Exposure to tobacco smoke, dust, fumes, and air pollution. What increases the risk? This condition is more likely to develop in people who:  Have close contact with someone with acute bronchitis.  Are exposed to lung irritants, such as tobacco smoke, dust, fumes, and vapors.  Have a weak immune system.  Have a respiratory condition such as asthma. What are the signs or symptoms? Symptoms of this condition include:  A cough.  Coughing up clear, yellow, or green mucus.  Wheezing.  Chest  congestion.  Shortness of breath.  A fever.  Body aches.  Chills.  A sore  throat. How is this diagnosed? This condition is usually diagnosed with a physical exam. During the exam, your health care provider may order tests, such as chest X-rays, to rule out other conditions. He or she may also:  Test a sample of your mucus for bacterial infection.  Check the level of oxygen in your blood. This is done to check for pneumonia.  Do a chest X-ray or lung function testing to rule out pneumonia and other conditions.  Perform blood tests. Your health care provider will also ask about your symptoms and medical history. How is this treated? Most cases of acute bronchitis clear up over time without treatment. Your health care provider may recommend:  Drinking more fluids. Drinking more makes your mucus thinner, which may make it easier to breathe.  Taking a medicine for a fever or cough.  Taking an antibiotic medicine.  Using an inhaler to help improve shortness of breath and to control a cough.  Using a cool mist vaporizer or humidifier to make it easier to breathe. Follow these instructions at home: Medicines   Take over-the-counter and prescription medicines only as told by your health care provider.  If you were prescribed an antibiotic, take it as told by your health care provider. Do not stop taking the antibiotic even if you start to feel better. General instructions   Get plenty of rest.  Drink enough fluids to keep your urine clear or pale yellow.  Avoid smoking and secondhand smoke. Exposure to cigarette smoke or irritating chemicals will make bronchitis worse. If you smoke and you need help quitting, ask your health care provider. Quitting smoking will help your lungs heal faster.  Use an inhaler, cool mist vaporizer, or humidifier as told by your health care provider.  Keep all follow-up visits as told by your health care provider. This is important. How is this prevented? To lower your risk of getting this condition again:  Wash your hands  often with soap and water. If soap and water are not available, use hand sanitizer.  Avoid contact with people who have cold symptoms.  Try not to touch your hands to your mouth, nose, or eyes.  Make sure to get the flu shot every year. Contact a health care provider if:  Your symptoms do not improve in 2 weeks of treatment. Get help right away if:  You cough up blood.  You have chest pain.  You have severe shortness of breath.  You become dehydrated.  You faint or keep feeling like you are going to faint.  You keep vomiting.  You have a severe headache.  Your fever or chills gets worse. This information is not intended to replace advice given to you by your health care provider. Make sure you discuss any questions you have with your health care provider. Document Released: 07/29/2004 Document Revised: 01/14/2016 Document Reviewed: 12/10/2015 Elsevier Interactive Patient Education  2017 [employer] Sinusitis, Adult Sinusitis is soreness and inflammation of your sinuses. Sinuses are hollow spaces in the bones around your face. Your sinuses are located:  Around your eyes.  In the middle of your forehead.  Behind your nose.  In your cheekbones. Your sinuses and nasal passages are lined with a stringy fluid (mucus). Mucus normally drains out of your sinuses. When your nasal tissues become inflamed or swollen, the mucus can become trapped or blocked so air cannot flow through your  sinuses. This allows bacteria, viruses, and funguses to grow, which leads to infection. Sinusitis can develop quickly and last for 7?10 days (acute) or for more than 12 weeks (chronic). Sinusitis often develops after a cold. What are the causes? This condition is caused by anything that creates swelling in the sinuses or stops mucus from draining, including:  Allergies.  Asthma.  Bacterial or viral infection.  Abnormally shaped bones between the nasal passages.  Nasal growths that contain  mucus (nasal polyps).  Narrow sinus openings.  Pollutants, such as chemicals or irritants in the air.  A foreign object stuck in the nose.  A fungal infection. This is rare. What increases the risk? The following factors may make you more likely to develop this condition:  Having allergies or asthma.  Having had a recent cold or respiratory tract infection.  Having structural deformities or blockages in your nose or sinuses.  Having a weak immune system.  Doing a lot of swimming or diving.  Overusing nasal sprays.  Smoking. What are the signs or symptoms? The main symptoms of this condition are pain and a feeling of pressure around the affected sinuses. Other symptoms include:  Upper toothache.  Earache.  Headache.  Bad breath.  Decreased sense of smell and taste.  A cough that may get worse at night.  Fatigue.  Fever.  Thick drainage from your nose. The drainage is often green and it may contain pus (purulent).  Stuffy nose or congestion.  Postnasal drip. This is when extra mucus collects in the throat or back of the nose.  Swelling and warmth over the affected sinuses.  Sore throat.  Sensitivity to light. How is this diagnosed? This condition is diagnosed based on symptoms, a medical history, and a physical exam. To find out if your condition is acute or chronic, your health care provider may:  Look in your nose for signs of nasal polyps.  Tap over the affected sinus to check for signs of infection.  View the inside of your sinuses using an imaging device that has a light attached (endoscope). If your health care provider suspects that you have chronic sinusitis, you may also:  Be tested for allergies.  Have a sample of mucus taken from your nose (nasal culture) and checked for bacteria.  Have a mucus sample examined to see if your sinusitis is related to an allergy. If your sinusitis does not respond to treatment and it lasts longer than 8  weeks, you may have an MRI or CT scan to check your sinuses. These scans also help to determine how severe your infection is. In rare cases, a bone biopsy may be done to rule out more serious types of fungal sinus disease. How is this treated? Treatment for sinusitis depends on the cause and whether your condition is chronic or acute. If a virus is causing your sinusitis, your symptoms will go away on their own within 10 days. You may be given medicines to relieve your symptoms, including:  Topical nasal decongestants. They shrink swollen nasal passages and let mucus drain from your sinuses.  Antihistamines. These drugs block inflammation that is triggered by allergies. This can help to ease swelling in your nose and sinuses.  Topical nasal corticosteroids. These are nasal sprays that ease inflammation and swelling in your nose and sinuses.  Nasal saline washes. These rinses can help to get rid of thick mucus in your nose. If your condition is caused by bacteria, you will be given an antibiotic medicine.  If your condition is caused by a fungus, you will be given an antifungal medicine. Surgery may be needed to correct underlying conditions, such as narrow nasal passages. Surgery may also be needed to remove polyps. Follow these instructions at home: Medicines   Take, use, or apply over-the-counter and prescription medicines only as told by your health care provider. These may include nasal sprays.  If you were prescribed an antibiotic medicine, take it as told by your health care provider. Do not stop taking the antibiotic even if you start to feel better. Hydrate and Humidify   Drink enough water to keep your urine clear or pale yellow. Staying hydrated will help to thin your mucus.  Use a cool mist humidifier to keep the humidity level in your home above 50%.  Inhale steam for 10-15 minutes, 3-4 times a day or as told by your health care provider. You can do this in the bathroom while a  hot shower is running.  Limit your exposure to cool or dry air. Rest   Rest as much as possible.  Sleep with your head raised (elevated).  Make sure to get enough sleep each night. General instructions   Apply a warm, moist washcloth to your face 3-4 times a day or as told by your health care provider. This will help with discomfort.  Wash your hands often with soap and water to reduce your exposure to viruses and other germs. If soap and water are not available, use hand sanitizer.  Do not smoke. Avoid being around people who are smoking (secondhand smoke).  Keep all follow-up visits as told by your health care provider. This is important. Contact a health care provider if:  You have a fever.  Your symptoms get worse.  Your symptoms do not improve within 10 days. Get help right away if:  You have a severe headache.  You have persistent vomiting.  You have pain or swelling around your face or eyes.  You have vision problems.  You develop confusion.  Your neck is stiff.  You have trouble breathing. This information is not intended to replace advice given to you by your health care provider. Make sure you discuss any questions you have with your health care provider. Document Released: 06/21/2005 Document Revised: 02/15/2016 Document Reviewed: 04/16/2015 Elsevier Interactive Patient Education  2017 [employer].

## 2016-11-18 ENCOUNTER — Ambulatory Visit: Payer: Self-pay | Admitting: Registered Nurse

## 2016-11-18 VITALS — BP 105/79 | HR 75 | Temp 98.5°F

## 2016-11-18 DIAGNOSIS — H6593 Unspecified nonsuppurative otitis media, bilateral: Secondary | ICD-10-CM

## 2016-11-18 DIAGNOSIS — J45909 Unspecified asthma, uncomplicated: Secondary | ICD-10-CM

## 2016-11-18 DIAGNOSIS — J301 Allergic rhinitis due to pollen: Secondary | ICD-10-CM

## 2016-11-18 MED ORDER — SALINE SPRAY 0.65 % NA SOLN: 2.0000 | NASAL | 0 refills | Status: DC

## 2016-11-18 MED ORDER — MONTELUKAST SODIUM 10 MG PO TABS: 10.0000 mg | ORAL_TABLET | Freq: Every day | ORAL | 3 refills | Status: DC

## 2016-11-18 MED ORDER — FLUTICASONE PROPIONATE 50 MCG/ACT NA SUSP: 1.0000 | Freq: Two times a day (BID) | NASAL | 0 refills | Status: DC

## 2016-11-18 MED ORDER — ALBUTEROL SULFATE HFA 108 (90 BASE) MCG/ACT IN AERS: 1.0000 | INHALATION_SPRAY | RESPIRATORY_TRACT | 0 refills | Status: DC | PRN

## 2016-11-18 NOTE — Patient Instructions (Addendum)
Restart nasal saline 2 sprays each nostril every 2 hours while away as needed congestion Restart flonase 1 spray each nostril twice a day after shower Shower twice a day or once before bedtime Consider wearing mask when outside on high pollen count days Albuterol 1-2 puffs by mouth as needed for chest tightness/wheezing/protracted cough pick up at [employer] of choice Start singulair 10mg  by mouth at bedtime Restart zyrtec 10mg  by mouth daily Follow up re-evaluation if fever, coughing up blood, shortness of breath, ear discharge/bleeding, teeth pain/sinus pain  Otitis Media With Effusion, Pediatric Otitis media with effusion (OME) occurs when there is inflammation of the middle ear and fluid in the middle ear space. There are no signs and symptoms of infection. The middle ear space contains air and the bones for hearing. Air in the middle ear space helps to transmit sound to the brain. OME is a common condition in children, and it often occurs after an ear infection. This condition may be present for several weeks or longer after an ear infection. Most cases of this condition get better on their own. What are the causes? OME is caused by a blockage of the eustachian tube in one or both ears. These tubes drain fluid in the ears to the back of the nose (nasopharynx). If the tissue in the tube swells up (edema), the tube closes. This prevents fluid from draining. Blockage can be caused by:  Ear infections.  Colds and other upper respiratory infections.  Allergies.  Irritants, such as tobacco smoke.  Enlarged adenoids. The adenoids are areas of soft tissue located high in the back of the throat, behind the nose and the roof of the mouth. They are part of the body's natural defense (immune) system.  A mass in the nasopharynx.  Damage to the ear caused by pressure changes (barotrauma). What increases the risk? Your child is more likely to develop this condition if:  He or she has  repeated ear and sinus infections.  He or she has allergies.  He or she is exposed to tobacco smoke.  He or she attends daycare.  He or she is not breastfed. What are the signs or symptoms? Symptoms of this condition may not be obvious. Sometimes this condition does not have any symptoms, or symptoms may overlap with those of a cold or upper respiratory tract illness. Symptoms of this condition include:  Temporary hearing loss.  A feeling of fullness in the ear without pain.  Irritability or agitation.  Balance (vestibular) problems. As a result of hearing loss, your child may:  Listen to the TV at a loud volume.  Not respond to questions.  Ask "What?" often when spoken to.  Mistake or confuse one sound or word for another.  Perform poorly at school.  Have a poor attention span.  Become agitated or irritated easily. How is this diagnosed? This condition is diagnosed with an ear exam. Your child's health care provider will look inside your child's ear with an instrument (otoscope) to check for redness, swelling, and fluid. Other tests may be done, including:  A test to check the movement of the eardrum (pneumatic otoscopy). This is done by squeezing a small amount of air into the ear.  A test that changes air pressure in the middle ear to check how well the eardrum moves and to see if the eustachian tube is working (tympanogram).  Hearing test (audiogram). This test involves playing tones at different pitches to see if your child can hear  each tone. How is this treated? Treatment for this condition depends on the cause. In many cases, the fluid goes away on its own. In some cases, your child may need a procedure to create a hole in the eardrum to allow fluid to drain (myringotomy) and to insert small drainage tubes (tympanostomy tubes) into the eardrums. These tubes help to drain fluid and prevent infection. This procedure may be recommended if:  OME does not get better  over several months.  Your child has many ear infections within several months.  Your child has noticeable hearing loss.  Your child has problems with speech and language development. Surgery may also be done to remove the adenoids (adenoidectomy). Follow these instructions at home:  Give over-the-counter and prescription medicines only as told by your child's health care provider.  Keep children away from any tobacco smoke.  Keep all follow-up visits as told by your child's health care provider. This is important. How is this prevented?  Keep your child's vaccinations up to date. Make sure your child gets all recommended vaccinations, including a pneumonia and flu vaccine.  Encourage hand washing. Your child should wash his or her hands often with soap and water. If there is no soap and water, he or she should use hand sanitizer.  Avoid exposing your child to tobacco smoke.  Breastfeed your baby, if possible. Babies who are breastfed as long as possible are less likely to develop this condition. Contact a health care provider if:  Your child's hearing does not get better after 3 months.  Your child's hearing is worse.  Your child has ear pain.  Your child has a fever.  Your child has drainage from the ear.  Your child is dizzy.  Your child has a lump on his or her neck. Get help right away if:  Your child has bleeding from the nose.  Your child cannot move part of her or his face.  Your child has trouble breathing.  Your child cannot smell.  Your child develops severe congestion.  Your child develops weakness.  Your child who is younger than 3 months has a temperature of 100F (38C) or higher. Summary  Otitis media with effusion (OME) occurs when there is inflammation of the middle ear and fluid in the middle ear space.  This condition is caused by blockage of one or both eustachian tubes, which drain fluid in the ears to the back of the nose.  Symptoms  of this condition can include temporary hearing loss, a feeling of fullness in the ear, irritability or agitation, and balance (vestibular) problems. Sometimes, there are no symptoms.  This condition is diagnosed with an ear exam and tests, such as pneumatic otoscopy, tympanogram, and audiogram.  Treatment for this condition depends on the cause. In many cases, the fluid goes away on its own. This information is not intended to replace advice given to you by your health care provider. Make sure you discuss any questions you have with your health care provider. Document Released: 09/11/2003 Document Revised: 05/13/2016 Document Reviewed: 05/13/2016 Elsevier Interactive Patient Education  2017 [employer] Allergic Rhinitis Allergic rhinitis is when the mucous membranes in the nose respond to allergens. Allergens are particles in the air that cause your body to have an allergic reaction. This causes you to release allergic antibodies. Through a chain of events, these eventually cause you to release histamine into the blood stream. Although meant to protect the body, it is this release of histamine that causes your  discomfort, such as frequent sneezing, congestion, and an itchy, runny nose. What are the causes? Seasonal allergic rhinitis (hay fever) is caused by pollen allergens that may come from grasses, trees, and weeds. Year-round allergic rhinitis (perennial allergic rhinitis) is caused by allergens such as house dust mites, pet dander, and mold spores. What are the signs or symptoms?  Nasal stuffiness (congestion).  Itchy, runny nose with sneezing and tearing of the eyes. How is this diagnosed? Your health care provider can help you determine the allergen or allergens that trigger your symptoms. If you and your health care provider are unable to determine the allergen, skin or blood testing may be used. Your health care provider will diagnose your condition after taking your health history and  performing a physical exam. Your health care provider may assess you for other related conditions, such as asthma, pink eye, or an ear infection. How is this treated? Allergic rhinitis does not have a cure, but it can be controlled by:  Medicines that block allergy symptoms. These may include allergy shots, nasal sprays, and oral antihistamines.  Avoiding the allergen. Hay fever may often be treated with antihistamines in pill or nasal spray forms. Antihistamines block the effects of histamine. There are over-the-counter medicines that may help with nasal congestion and swelling around the eyes. Check with your health care provider before taking or giving this medicine. If avoiding the allergen or the medicine prescribed do not work, there are many new medicines your health care provider can prescribe. Stronger medicine may be used if initial measures are ineffective. Desensitizing injections can be used if medicine and avoidance does not work. Desensitization is when a patient is given ongoing shots until the body becomes less sensitive to the allergen. Make sure you follow up with your health care provider if problems continue. Follow these instructions at home: It is not possible to completely avoid allergens, but you can reduce your symptoms by taking steps to limit your exposure to them. It helps to know exactly what you are allergic to so that you can avoid your specific triggers. Contact a health care provider if:  You have a fever.  You develop a cough that does not stop easily (persistent).  You have shortness of breath.  You start wheezing.  Symptoms interfere with normal daily activities. This information is not intended to replace advice given to you by your health care provider. Make sure you discuss any questions you have with your health care provider. Document Released: 03/16/2001 Document Revised: 02/20/2016 Document Reviewed: 02/26/2013 Elsevier Interactive Patient Education   2017 [employer].  How to Use a Metered Dose Inhaler A metered dose inhaler is a handheld device for taking medicine that must be breathed into the lungs (inhaled). The device can be used to deliver a variety of inhaled medicines, including:  Quick relief or rescue medicines, such as bronchodilators.  Controller medicines, such as corticosteroids. The medicine is delivered by pushing down on a metal canister to release a preset amount of spray and medicine. Each device contains the amount of medicine that is needed for a preset number of uses (inhalations). Your health care provider may recommend that you use a spacer with your inhaler to help you take the medicine more effectively. A spacer is a plastic tube with a mouthpiece on one end and an opening that connects to the inhaler on the other end. A spacer holds the medicine in a tube for a short time, which allows you to inhale more  medicine. What are the risks? If you do not use your inhaler correctly, medicine might not reach your lungs to help you breathe. Inhaler medicine can cause side effects, such as:  Mouth or throat infection.  Cough.  Hoarseness.  Headache.  Nausea and vomiting.  Lung infection (pneumonia) in people who have a lung condition called COPD. How to use a metered dose inhaler without a spacer 1. Remove the cap from the inhaler. 2. If you are using the inhaler for the first time, shake it for 5 seconds, turn it away from your face, then release 4 puffs into the air. This is called priming. 3. Shake the inhaler for 5 seconds. 4. Position the inhaler so the top of the canister faces up. 5. Put your index finger on the top of the medicine canister. Support the bottom of the inhaler with your thumb. 6. Breathe out normally and as completely as possible, away from the inhaler. 7. Either place the inhaler between your teeth and close your lips tightly around the mouthpiece, or hold the inhaler 1-2 inches (2.5-5 cm)  away from your open mouth. Keep your tongue down out of the way. If you are unsure which technique to use, ask your health care provider. 8. Press the canister down with your index finger to release the medicine, then inhale deeply and slowly through your mouth (not your nose) until your lungs are completely filled. Inhaling should take 4-6 seconds. 9. Hold the medicine in your lungs for 5-10 seconds (10 seconds is best). This helps the medicine get into the small airways of your lungs. 10. With your lips in a tight circle (pursed), breathe out slowly. 11. Repeat steps 3-10 until you have taken the number of puffs that your health care provider directed. Wait about 1 minute between puffs or as directed. 12. Put the cap on the inhaler. 13. If you are using a steroid inhaler, rinse your mouth with water, gargle, and spit out the water. Do not swallow the water. How to use a metered dose inhaler with a spacer 1. Remove the cap from the inhaler. 2. If you are using the inhaler for the first time, shake it for 5 seconds, turn it away from your face, then release 4 puffs into the air. This is called priming. 3. Shake the inhaler for 5 seconds. 4. Place the open end of the spacer onto the inhaler mouthpiece. 5. Position the inhaler so the top of the canister faces up and the spacer mouthpiece faces you. 6. Put your index finger on the top of the medicine canister. Support the bottom of the inhaler and the spacer with your thumb. 7. Breathe out normally and as completely as possible, away from the spacer. 8. Place the spacer between your teeth and close your lips tightly around it. Keep your tongue down out of the way. 9. Press the canister down with your index finger to release the medicine, then inhale deeply and slowly through your mouth (not your nose) until your lungs are completely filled. Inhaling should take 4-6 seconds. 10. Hold the medicine in your lungs for 5-10 seconds (10 seconds is best). This  helps the medicine get into the small airways of your lungs. 11. With your lips in a tight circle (pursed), breathe out slowly. 12. Repeat steps 3-11 until you have taken the number of puffs that your health care provider directed. Wait about 1 minute between puffs or as directed. 13. Remove the spacer from the inhaler and put  the cap on the inhaler. 14. If you are using a steroid inhaler, rinse your mouth with water, gargle, and spit out the water. Do not swallow the water. Follow these instructions at home:  Take your inhaled medicine only as told by your health care provider. Do not use the inhaler more than directed by your health care provider.  Keep all follow-up visits as told by your health care provider. This is important.  If your inhaler has a counter, you can check it to determine how full your inhaler is. If your inhaler does not have a counter, ask your health care provider when you will need to refill your inhaler and write the refill date on a calendar or on your inhaler canister. Note that you cannot know when an inhaler is empty by shaking it.  Follow directions on the package insert for care and cleaning of your inhaler and spacer. Contact a health care provider if:  Symptoms are only partially relieved with your inhaler.  You are having trouble using your inhaler.  You have an increase in phlegm.  You have headaches. Get help right away if:  You feel little or no relief after using your inhaler.  You have dizziness.  You have a fast heart rate.  You have chills or a fever.  You have night sweats.  There is blood in your phlegm. Summary  A metered dose inhaler is a handheld device for taking medicine that must be breathed into the lungs (inhaled).  The medicine is delivered by pushing down on a metal canister to release a preset amount of spray and medicine.  Each device contains the amount of medicine that is needed for a preset number of uses  (inhalations). This information is not intended to replace advice given to you by your health care provider. Make sure you discuss any questions you have with your health care provider. Document Released: 06/21/2005 Document Revised: 05/11/2016 Document Reviewed: 05/11/2016 Elsevier Interactive Patient Education  2017 [employer]  Acute Bronchitis, Adult Acute bronchitis is sudden (acute) swelling of the air tubes (bronchi) in the lungs. Acute bronchitis causes these tubes to fill with mucus, which can make it hard to breathe. It can also cause coughing or wheezing. In adults, acute bronchitis usually goes away within 2 weeks. A cough caused by bronchitis may last up to 3 weeks. Smoking, allergies, and asthma can make the condition worse. Repeated episodes of bronchitis may cause further lung problems, such as chronic obstructive pulmonary disease (COPD). What are the causes? This condition can be caused by germs and by substances that irritate the lungs, including:  Cold and flu viruses. This condition is most often caused by the same virus that causes a cold.  Bacteria.  Exposure to tobacco smoke, dust, fumes, and air pollution. What increases the risk? This condition is more likely to develop in people who:  Have close contact with someone with acute bronchitis.  Are exposed to lung irritants, such as tobacco smoke, dust, fumes, and vapors.  Have a weak immune system.  Have a respiratory condition such as asthma. What are the signs or symptoms? Symptoms of this condition include:  A cough.  Coughing up clear, yellow, or green mucus.  Wheezing.  Chest congestion.  Shortness of breath.  A fever.  Body aches.  Chills.  A sore throat. How is this diagnosed? This condition is usually diagnosed with a physical exam. During the exam, your health care provider may order tests, such as chest  X-rays, to rule out other conditions. He or she may also:  Test a sample of your  mucus for bacterial infection.  Check the level of oxygen in your blood. This is done to check for pneumonia.  Do a chest X-ray or lung function testing to rule out pneumonia and other conditions.  Perform blood tests. Your health care provider will also ask about your symptoms and medical history. How is this treated? Most cases of acute bronchitis clear up over time without treatment. Your health care provider may recommend:  Drinking more fluids. Drinking more makes your mucus thinner, which may make it easier to breathe.  Taking a medicine for a fever or cough.  Taking an antibiotic medicine.  Using an inhaler to help improve shortness of breath and to control a cough.  Using a cool mist vaporizer or humidifier to make it easier to breathe. Follow these instructions at home: Medicines   Take over-the-counter and prescription medicines only as told by your health care provider.  If you were prescribed an antibiotic, take it as told by your health care provider. Do not stop taking the antibiotic even if you start to feel better. General instructions   Get plenty of rest.  Drink enough fluids to keep your urine clear or pale yellow.  Avoid smoking and secondhand smoke. Exposure to cigarette smoke or irritating chemicals will make bronchitis worse. If you smoke and you need help quitting, ask your health care provider. Quitting smoking will help your lungs heal faster.  Use an inhaler, cool mist vaporizer, or humidifier as told by your health care provider.  Keep all follow-up visits as told by your health care provider. This is important. How is this prevented? To lower your risk of getting this condition again:  Wash your hands often with soap and water. If soap and water are not available, use hand sanitizer.  Avoid contact with people who have cold symptoms.  Try not to touch your hands to your mouth, nose, or eyes.  Make sure to get the flu shot every year. Contact  a health care provider if:  Your symptoms do not improve in 2 weeks of treatment. Get help right away if:  You cough up blood.  You have chest pain.  You have severe shortness of breath.  You become dehydrated.  You faint or keep feeling like you are going to faint.  You keep vomiting.  You have a severe headache.  Your fever or chills gets worse. This information is not intended to replace advice given to you by your health care provider. Make sure you discuss any questions you have with your health care provider. Document Released: 07/29/2004 Document Revised: 01/14/2016 Document Reviewed: 12/10/2015 Elsevier Interactive Patient Education  2017 [employer].

## 2016-11-18 NOTE — Progress Notes (Signed)
Subjective:    Patient ID: Isabella Robertson, female    DOB: [birth year], 51 y.o.   MRN: [id number]  50y/o caucasian female here for re-evaluation sore throat and cough.  Pt requesting refill of Proair inhaler. States she has been using it every 3 hours or so. Reports it is empty. Also used Tessalon pearles but still was using Albuterol just as often. Finished Amoxicillin. No longer taking Phenylephrine. Still taking Zyrtec. No longer using Saline spray. Out of Flonase and needs refill on both. Still has cough, and is wondering if she needs something for her throat as the coughing fits always start because she feels like something is stuck in her throat and she is using the albuterol for this but it doesn't help.  "feeling better overall" stopped using allergy eye drops also.      Review of Systems  Constitutional: Negative for activity change, appetite change, chills, diaphoresis, fatigue, fever and unexpected weight change.  HENT: Positive for congestion, postnasal drip, rhinorrhea and sore throat. Negative for dental problem, drooling, ear discharge, ear pain, facial swelling, hearing loss, mouth sores, nosebleeds, sinus pain, sinus pressure, sneezing, tinnitus, trouble swallowing and voice change.   Eyes: Negative for photophobia, pain, discharge, redness, itching and visual disturbance.  Respiratory: Positive for cough. Negative for choking, chest tightness, shortness of breath, wheezing and stridor.   Cardiovascular: Negative for chest pain, palpitations and leg swelling.  Gastrointestinal: Negative for abdominal distention, abdominal pain, blood in stool, constipation, diarrhea, nausea and vomiting.  Endocrine: Negative for cold intolerance and heat intolerance.  Genitourinary: Negative for difficulty urinating, dysuria and hematuria.  Musculoskeletal: Negative for arthralgias, back pain, gait problem, joint swelling, myalgias, neck pain and neck stiffness.  Skin: Negative for color  change, pallor, rash and wound.  Allergic/Immunologic: Positive for environmental allergies. Negative for food allergies.  Neurological: Negative for dizziness, tremors, seizures, syncope, facial asymmetry, speech difficulty, weakness, light-headedness, numbness and headaches.  Hematological: Negative for adenopathy. Does not bruise/bleed easily.  Psychiatric/Behavioral: Negative for agitation, behavioral problems, confusion and sleep disturbance.       Objective:   Physical Exam  Constitutional: She is oriented to person, place, and time. Vital signs are normal. She appears well-developed and well-nourished. She is active and cooperative.  Non-toxic appearance. She does not have a sickly appearance. She does not appear ill. No distress.  HENT:  Head: Normocephalic and atraumatic.  Right Ear: Hearing, external ear and ear canal normal. A middle ear effusion is present.  Left Ear: Hearing, external ear and ear canal normal. A middle ear effusion is present.  Nose: Mucosal edema and rhinorrhea present. No nose lacerations, sinus tenderness, nasal deformity, septal deviation or nasal septal hematoma. No epistaxis.  No foreign bodies. Right sinus exhibits no maxillary sinus tenderness and no frontal sinus tenderness. Left sinus exhibits no maxillary sinus tenderness and no frontal sinus tenderness.  Mouth/Throat: Uvula is midline and mucous membranes are normal. Mucous membranes are not pale, not dry and not cyanotic. She does not have dentures. No oral lesions. No trismus in the jaw. Normal dentition. No dental abscesses, uvula swelling, lacerations or dental caries. Posterior oropharyngeal edema and posterior oropharyngeal erythema present. No oropharyngeal exudate or tonsillar abscesses.  Tonsils 1-2+/4 bilaterally edema/erythema no exudate; cobblestoning posterior pharynx; bilateral TMs air fluid level clear; bilateral allergic shiners; bilateral nasal turbinates edema/erythema scant yellow  discharge  Eyes: Conjunctivae, EOM and lids are normal. Pupils are equal, round, and reactive to light. Right eye exhibits no chemosis, no  discharge, no exudate and no hordeolum. No foreign body present in the right eye. Left eye exhibits no chemosis, no discharge, no exudate and no hordeolum. No foreign body present in the left eye. Right conjunctiva is not injected. Right conjunctiva has no hemorrhage. Left conjunctiva is not injected. Left conjunctiva has no hemorrhage. No scleral icterus. Right eye exhibits normal extraocular motion and no nystagmus. Left eye exhibits normal extraocular motion and no nystagmus. Right pupil is round and reactive. Left pupil is round and reactive. Pupils are equal.  Neck: Trachea normal, normal range of motion and phonation normal. Neck supple. No tracheal tenderness, no spinous process tenderness and no muscular tenderness present. No neck rigidity. No tracheal deviation, no edema, no erythema and normal range of motion present. No thyroid mass and no thyromegaly present.  Cardiovascular: Normal rate, regular rhythm, S1 normal, S2 normal, normal heart sounds and intact distal pulses.  PMI is not displaced.  Exam reveals no gallop and no friction rub.   No murmur heard. Pulses:      Radial pulses are 2+ on the right side, and 2+ on the left side.  Pulmonary/Chest: Effort normal and breath sounds normal. No accessory muscle usage or stridor. No respiratory distress. She has no decreased breath sounds. She has no wheezes. She has no rhonchi. She has no rales. She exhibits no tenderness.  No cough observed in exam room; speaks full sentences without difficulty; negative egophany all fields  Abdominal: Soft. She exhibits no distension.  Musculoskeletal: Normal range of motion. She exhibits no edema or tenderness.       Right shoulder: Normal.       Left shoulder: Normal.       Right hip: Normal.       Left hip: Normal.       Right knee: Normal.       Left knee: Normal.        Cervical back: Normal.       Right hand: Normal.       Left hand: Normal.  Lymphadenopathy:       Head (right side): No submental, no submandibular, no tonsillar, no preauricular, no posterior auricular and no occipital adenopathy present.       Head (left side): No submental, no submandibular, no tonsillar, no preauricular, no posterior auricular and no occipital adenopathy present.    She has no cervical adenopathy.       Right cervical: No superficial cervical, no deep cervical and no posterior cervical adenopathy present.      Left cervical: No superficial cervical, no deep cervical and no posterior cervical adenopathy present.  Neurological: She is alert and oriented to person, place, and time. She has normal strength. She is not disoriented. She displays no atrophy and no tremor. No cranial nerve deficit or sensory deficit. She exhibits normal muscle tone. She displays no seizure activity. Coordination and gait normal. GCS eye subscore is 4. GCS verbal subscore is 5. GCS motor subscore is 6.  On/off exam table; in/out chair and gait sure and steady exam room/hall  Skin: Skin is warm, dry and intact. No abrasion, no bruising, no burn, no ecchymosis, no laceration, no lesion, no petechiae and no rash noted. She is not diaphoretic. No cyanosis or erythema. No pallor. Nails show no clubbing.  Psychiatric: She has a normal mood and affect. Her speech is normal and behavior is normal. Judgment and thought content normal. Cognition and memory are normal.  Nursing note and vitals reviewed.  Assessment & Plan:  A-bilateral otitis media effusion; seasonal allergic rhinitis, acute bronchitis  Rx singulair 10mg  po qhs #30 RF3  refilled albuterol MDI 89mcg 1-2 puffs po q4-6h prn wheeze/protracted cough/chest tightness #1 RF0, continue zyrtec 10mg  po daily #30 RF11 as FSA Given new bottle and Continue nasal saline 2 sprays each notril q2h wa at home. Given 10 UD from clinic stock cough  lozenges po q2h prn cough.evidence of systemic bacterial infection, non toxic and well hydrated.  I do not see where any further testing or imaging is necessary at this time.   I will suggest supportive care, rest, good hygiene and encourage the patient to take adequate fluids.  The patient is to return to clinic or EMERGENCY ROOM if symptoms worsen or change significantly.  Exitcare handout on bronchitis given to patient.  Patient verbalized agreement and understanding of treatment plan and had no further questions at this time.   P2:  Hand washing and cover cough    seasonal allergies probably started Bronchitis simple.  If no improvement with treatment of sinusitis will consider oral steroids.  Rx tessalon and albuterol MDI see above.  Viruses are the most common cause of bronchial inflammation in otherwise healthy adults with acute bronchitis.  The appearance of sputum is not predictive of whether a bacterial infection is present.  Purulent sputum is most often caused by viral infections.  There are a small portion of those caused by non-viral agents being Mycoplamsa pneumonia.  Microscopic examination or C&S of sputum in the healthy adult with acute bronchitis is generally not helpful (usually negative or normal respiratory flora) other considerations being cough from upper respiratory tract infections, sinusitis or allergic syndromes (mild asthma or viral pneumonia).  Differential Diagnosis:  reactive airway disease (asthma, allergic aspergillosis (eosinophilia), chronic bronchitis, respiratory infection (Sinusitis, Common cold, pneumonia), congestive heart failure, reflux esophagitis, bronchogenic tumor, aspiration syndromes and/or exposure irritants/tobacco smoke.  In this case, there is no evidence of any invasive bacterial illness.  Most likely allergic/viral etiology so will hold on antibiotic treatment.  Advise supportive care with rest, encourage fluids, good hygiene and watch for any worsening  symptoms.  If they were to develop:  come back to the office or go to the emergency room if after hours. Without high fever, severe dyspnea, lack of physical findings or other risk factors, I will hold on a chest radiograph and CBC at this time. I discussed that approximately 50% of patients with acute bronchitis have a cough that lasts up to three weeks, and 25% for over a month.  Tylenol, one to two tablets every four hours as needed for fever or myalgias.   No aspirin.  Patient instructed to follow up in one week or sooner if symptoms worsen. Patient verbalized agreement and understanding of treatment plan.  P2:  hand washing and cover cough   Patient may use normal saline nasal spray as needed.  Continue antihistamine and nasal steroid use adding singulair 10mg  po daily #30 RF3.  Avoid triggers if possible.  Shower prior to bedtime if exposed to triggers.  If allergic dust/dust mites recommend mattress/pillow covers/encasements; washing linens, vacuuming, sweeping, dusting weekly.  Call or return to clinic as needed if these symptoms worsen or fail to improve as anticipated.   Exitcare handout on allergic rhinitis given to patient.  Patient verbalized understanding of instructions, agreed with plan of care and had no further questions at this time.

## 2016-11-24 ENCOUNTER — Telehealth: Payer: Self-pay | Admitting: Family Medicine

## 2016-11-24 NOTE — Telephone Encounter (Signed)
Pt called c/o of dizziness and no other symptoms. No available appts today sent call to [facility].   Call pt @ [phone]

## 2016-11-24 NOTE — Telephone Encounter (Signed)
°  Patient Name: Isabella Robertson [facility]  DOB: 02-06-1966    Initial Comment dizzy   Nurse Assessment  Nurse: Zorita Pang, RN, Deborah Date/Time (Eastern Time): 11/24/2016 9:16:46 AM  Confirm and document reason for call. If symptomatic, describe symptoms. ---The patient states that she feels dizzy when she gets up in the morning. Some nausea. She states that she feels a little dizzy. She works on a [employer] and the screen turns black. She takes medications for allergies but has not had any for the past 2 days. She has taken it before.  Does the patient have any new or worsening symptoms? ---Yes  Will a triage be completed? ---Yes  Related visit to physician within the last 2 weeks? ---No  Does the PT have any chronic conditions? (i.e. diabetes, asthma, etc.) ---Yes  List chronic conditions. ---High cholesterol  Is the patient pregnant or possibly pregnant? (Ask all females between the ages of 84-55) ---No  Is this a behavioral health or substance abuse call? ---No     Guidelines    Guideline Title Affirmed Question Affirmed Notes  Dizziness - Lightheadedness [1] MODERATE dizziness (e.g., interferes with normal activities) AND [2] has NOT been evaluated by physician for this (Exception: dizziness caused by heat exposure, sudden standing, or poor fluid intake)    Final Disposition User   See Physician within 24 Hours Womble, RN, Neoma Laming    Comments  The medication that the caller is taking is Cetrizine and dizziness is 1-10% for dizziness and she is also complaining of a headache behind her eyes.   Referrals  REFERRED TO PCP OFFICE   Disagree/Comply: Comply

## 2016-11-24 NOTE — Telephone Encounter (Signed)
Awaiting [facility] note. thanks

## 2016-11-25 ENCOUNTER — Ambulatory Visit (INDEPENDENT_AMBULATORY_CARE_PROVIDER_SITE_OTHER): Payer: PRIVATE HEALTH INSURANCE | Admitting: Family Medicine

## 2016-11-25 ENCOUNTER — Encounter: Payer: Self-pay | Admitting: Family Medicine

## 2016-11-25 DIAGNOSIS — R42 Dizziness and giddiness: Secondary | ICD-10-CM | POA: Diagnosis not present

## 2016-11-25 MED ORDER — MECLIZINE HCL 25 MG PO TABS: 25.0000 mg | ORAL_TABLET | Freq: Three times a day (TID) | ORAL | 0 refills | Status: DC | PRN

## 2016-11-25 NOTE — Patient Instructions (Signed)
Vrtigo posicional benigno (Benign Positional Vertigo) El vrtigo es la sensacin de que usted o todo lo que lo rodea se mueven cuando en realidad eso no sucede. El vrtigo posicional benigno es el tipo de vrtigo ms comn. La causa de este trastorno no es grave (es benigna). Algunos movimientos y determinadas posiciones pueden desencadenar el trastorno (es posicional). El vrtigo posicional benigno puede ser peligroso si ocurre mientras est haciendo algo que podra suponer un riesgo para usted y para los dems, por ejemplo, conduciendo un automvil. CAUSAS En muchos de los casos, se desconoce la causa de este trastorno. Puede deberse a una alteracin en una zona del odo interno que ayuda al cerebro a percibir el movimiento y a coordinar el equilibrio. Esta alteracin puede deberse a una infeccin viral (laberintitis), a una lesin en la cabeza o a los movimientos reiterados. FACTORES DE RIESGO Es ms probable que esta afeccin se manifieste en:  Las mujeres.  Las personas mayores de 50aos. SNTOMAS Generalmente, los sntomas de este trastorno se presentan al mover la cabeza o los ojos en diferentes direcciones. Pueden aparecer repentinamente y suelen durar menos de un minuto. Entre los sntomas se pueden incluir los siguientes:  Prdida del equilibrio y cadas.  Sensacin de estar dando vueltas o movindose.  Sensacin de que el entorno est dando vueltas o movindose.  Nuseas y vmitos.  Visin borrosa.  Mareos.  Movimientos oculares involuntarios (nistagmo). Los sntomas pueden ser leves y algo fastidiosos, o pueden ser graves e interferir en la vida cotidiana. Los episodios de vrtigo posicional benigno pueden repetirse (ser recurrentes) a lo largo del tiempo, y algunos movimientos pueden desencadenarlos. Los sntomas pueden mejorar con el tiempo. DIAGNSTICO Generalmente, este trastorno se diagnostica con una historia clnica y un examen fsico de la cabeza, el cuello y los  odos. Tal vez lo deriven a un mdico especialista en problemas de la garganta, la nariz y el odo (otorrinolaringlogo), o a uno que se especializa en trastornos del sistema nervioso (neurlogo). Pueden hacerle otros estudios, entre ellos:  Resonancia magntica.  Tomografa computarizada.  Estudios de los movimientos oculares. El mdico puede pedirle que cambie rpidamente de posicin mientras observa si se presentan sntomas de vrtigo posicional benigno, por ejemplo, nistagmo. Los movimientos oculares se pueden estudiar con una electronistagmografa (ENG), con estimulacin trmica, mediante la maniobra de Dix-Hallpike o con la prueba de rotacin.  Electroencefalograma (EEG). Este estudio registra la actividad elctrica del cerebro.  Pruebas de audicin. TRATAMIENTO Generalmente, para tratar este trastorno, el mdico le har movimientos especficos con la cabeza para que el odo interno se normalice. Cuando los casos son graves, tal vez haya que realizar una ciruga, pero esto no es frecuente. En algunos casos, el vrtigo posicional benigno se resuelve por s solo en el trmino de 2 o 4semanas. INSTRUCCIONES PARA EL CUIDADO EN EL HOGAR Seguridad  Muvase lentamente.No haga movimientos bruscos con el cuerpo o con la cabeza.  No conduzca.  No opere maquinaria pesada.  No haga ninguna tarea que podra ser peligrosa para usted o para otras personas en caso de que ocurriera un episodio de vrtigo.  Si tiene dificultad para caminar o mantener el equilibrio, use un bastn para mantener la estabilidad. Si se siente mareado o inestable, sintese de inmediato.  Reanude sus actividades normales como se lo haya indicado el mdico. Pregntele al mdico qu actividades son seguras para usted. Instrucciones generales  Tome los medicamentos de venta libre y los recetados solamente como se lo haya indicado el mdico.    Evite algunas posiciones o determinados movimientos como se lo haya indicado el  mdico.  Beba suficiente lquido para mantener la orina clara o de color amarillo plido.  Concurra a todas las visitas de control como se lo haya indicado el mdico. Esto es importante. SOLICITE ATENCIN MDICA SI:  Isabella Robertson.  El trastorno Isabella Robertson, o le aparecen sntomas nuevos.  Sus familiares o amigos advierten cambios en su comportamiento.  Las nuseas o los vmitos empeoran.  Tiene sensacin de hormigueo o de adormecimiento. SOLICITE ATENCIN MDICA DE INMEDIATO SI:  Tiene dificultad para hablar o para moverse.  Esta mareado todo Isabella Robertson.  Se desmaya.  Tiene dolores de cabeza intensos.  Tiene debilidad en los brazos o las piernas.  Tiene cambios en la audicin o la visin.  Siente rigidez en el cuello.  Tiene sensibilidad a Isabella Robertson. Esta informacin no tiene Isabella Robertson el consejo del mdico. Asegrese de hacerle al mdico cualquier pregunta que tenga. Document Released: 10/07/2008 Document Revised: 03/12/2015 Document Reviewed: 10/14/2014 Elsevier Interactive Patient Education  2017 Isabella Robertson.

## 2016-11-25 NOTE — Assessment & Plan Note (Signed)
New problem. Treating with meclizine.

## 2016-11-25 NOTE — Progress Notes (Signed)
   Subjective:  Patient ID: Isabella Robertson, female    DOB: [birth year]  Age: 51 y.o. MRN: [id number]  CC: Dizziness  HPI:  51 year old female presents with complaints of dizziness.  Dizziness  Started yesterday morning.  Dizziness with sudden movements and with standing.  Improves with rest.  No associated nausea, vomiting. No fever.  She's had a recent headache. She also endorses some left ear discomfort.  Had recent floaters as well. I'm not sure this is improving.  No other associated symptoms. No other complaints this tim  Social Hx   Social History   Social History  . Marital status: Married    Spouse name: N/A  . Number of children: N/A  . Years of education: N/A   Social History Main Topics  . Smoking status: Never Smoker  . Smokeless tobacco: Never Used  . Alcohol use No  . Drug use: No  . Sexual activity: Yes   Other Topics Concern  . None   Social History Narrative  . None    Review of Systems  HENT: Positive for ear pain.   Neurological: Positive for dizziness and headaches.   Objective:  BP 108/62 (BP Location: Left Arm, Patient Position: Sitting, Cuff Size: Normal)   Pulse 68   Temp 98.2 F (36.8 C) (Oral)   Resp 16   Wt 152 lb (68.9 kg)   SpO2 99%   BMI 27.80 kg/m   BP/Weight 11/25/2016 11/18/2016 03/28/2682  Systolic BP 419 622 297  Diastolic BP 62 79 82  Wt. (Lbs) 152 - -  BMI 27.8 - -   Physical Exam  Constitutional: She is oriented to person, place, and time. She appears well-developed. No distress.  HENT:  Head: Normocephalic and atraumatic.  Mouth/Throat: Oropharynx is clear and moist.  Normal TM's bilaterally.   Eyes: Conjunctivae are normal. Pupils are equal, round, and reactive to light.  Cardiovascular: Normal rate and regular rhythm.   Pulmonary/Chest: Effort normal and breath sounds normal.  Neurological: She is alert and oriented to person, place, and time.  Dizziness reproduced with Marye Round    Psychiatric: She has a normal mood and affect.  Vitals reviewed.   Lab Results  Component Value Date   WBC 3.4 04/15/2016   HCT 41.1 04/15/2016   PLT 293 04/15/2016   GLUCOSE 91 04/15/2016   CHOL 244 (H) 04/15/2016   TRIG 159 (H) 04/15/2016   HDL 51 04/15/2016   LDLDIRECT 144.0 02/05/2015   LDLCALC 161 (H) 04/15/2016   ALT 24 04/15/2016   AST 28 04/15/2016   NA 139 04/15/2016   K 4.2 04/15/2016   CL 98 04/15/2016   CREATININE 0.86 04/15/2016   BUN 12 04/15/2016   TSH 0.846 04/15/2016   HGBA1C 5.2 04/15/2016    Assessment & Plan:   Problem List Items Addressed This Visit    Vertigo    New problem. Treating with meclizine.         Meds ordered this encounter  Medications  . meclizine (ANTIVERT) 25 MG tablet    Sig: Take 1 tablet (25 mg total) by mouth 3 (three) times daily as needed for dizziness.    Dispense:  30 tablet    Refill:  0    Follow-up: PRN  [place]

## 2017-04-07 ENCOUNTER — Encounter: Payer: Self-pay | Admitting: *Deleted

## 2017-04-08 ENCOUNTER — Ambulatory Visit: Payer: No Typology Code available for payment source | Admitting: Anesthesiology

## 2017-04-08 ENCOUNTER — Encounter: Payer: Self-pay | Admitting: *Deleted

## 2017-04-08 ENCOUNTER — Ambulatory Visit
Admission: RE | Admit: 2017-04-08 | Discharge: 2017-04-08 | Disposition: A | Discharge status: Home / Self Care | Payer: No Typology Code available for payment source | Source: Ambulatory Visit | Attending: Gastroenterology | Admitting: Gastroenterology

## 2017-04-08 ENCOUNTER — Encounter
Admission: RE | Disposition: A | Discharge status: Home / Self Care | Payer: Self-pay | Source: Ambulatory Visit | Attending: Gastroenterology

## 2017-04-08 DIAGNOSIS — Q438 Other specified congenital malformations of intestine: Secondary | ICD-10-CM | POA: Diagnosis not present

## 2017-04-08 DIAGNOSIS — J45909 Unspecified asthma, uncomplicated: Secondary | ICD-10-CM | POA: Diagnosis not present

## 2017-04-08 DIAGNOSIS — Z91013 Allergy to seafood: Secondary | ICD-10-CM | POA: Insufficient documentation

## 2017-04-08 DIAGNOSIS — Z1211 Encounter for screening for malignant neoplasm of colon: Secondary | ICD-10-CM | POA: Diagnosis not present

## 2017-04-08 DIAGNOSIS — K589 Irritable bowel syndrome without diarrhea: Secondary | ICD-10-CM | POA: Insufficient documentation

## 2017-04-08 DIAGNOSIS — Z79899 Other long term (current) drug therapy: Secondary | ICD-10-CM | POA: Diagnosis not present

## 2017-04-08 DIAGNOSIS — K219 Gastro-esophageal reflux disease without esophagitis: Secondary | ICD-10-CM | POA: Insufficient documentation

## 2017-04-08 DIAGNOSIS — K5730 Diverticulosis of large intestine without perforation or abscess without bleeding: Secondary | ICD-10-CM | POA: Insufficient documentation

## 2017-04-08 HISTORY — DX: Other constipation: K59.09

## 2017-04-08 HISTORY — DX: Other specified bacterial intestinal infections: A04.8

## 2017-04-08 HISTORY — PX: COLONOSCOPY WITH PROPOFOL: SHX5780

## 2017-04-08 SURGERY — COLONOSCOPY WITH PROPOFOL
Anesthesia: General

## 2017-04-08 MED ORDER — PROPOFOL 10 MG/ML IV BOLUS
INTRAVENOUS | Status: DC | PRN
  Administered 2017-04-08: 40 mg via INTRAVENOUS
  Administered 2017-04-08: 60 mg via INTRAVENOUS

## 2017-04-08 MED ORDER — PROPOFOL 500 MG/50ML IV EMUL
INTRAVENOUS | Status: DC | PRN
  Administered 2017-04-08: 150 ug/kg/min via INTRAVENOUS

## 2017-04-08 MED ORDER — LIDOCAINE HCL (PF) 2 % IJ SOLN
INTRAMUSCULAR | Status: AC
  Filled 2017-04-08: qty 10

## 2017-04-08 MED ORDER — SODIUM CHLORIDE 0.9 % IV SOLN: INTRAVENOUS | Status: DC

## 2017-04-08 MED ORDER — PROPOFOL 10 MG/ML IV BOLUS
INTRAVENOUS | Status: AC
  Filled 2017-04-08: qty 20

## 2017-04-08 MED ORDER — LIDOCAINE HCL (CARDIAC) 20 MG/ML IV SOLN
INTRAVENOUS | Status: DC | PRN
  Administered 2017-04-08: 70 mg via INTRAVENOUS

## 2017-04-08 MED ORDER — PROPOFOL 500 MG/50ML IV EMUL
INTRAVENOUS | Status: AC
  Filled 2017-04-08: qty 50

## 2017-04-08 MED ORDER — SODIUM CHLORIDE 0.9 % IV SOLN
INTRAVENOUS | Status: DC
  Administered 2017-04-08: 1000 mL via INTRAVENOUS

## 2017-04-08 MED ORDER — LIDOCAINE HCL (PF) 1 % IJ SOLN
INTRAMUSCULAR | Status: AC
Start: 2017-04-08 — End: 2017-04-08
  Administered 2017-04-08: 0.3 mL
  Filled 2017-04-08: qty 2

## 2017-04-08 NOTE — Transfer of Care (Signed)
Immediate Anesthesia Transfer of Care Note  Patient: Isabella Robertson  Procedure(s) Performed: COLONOSCOPY WITH PROPOFOL (N/A )  Patient Location: PACU  Anesthesia Type:General  Level of Consciousness: drowsy and patient cooperative  Airway & Oxygen Therapy: Patient Spontanous Breathing and Patient connected to nasal cannula oxygen  Post-op Assessment: Report given to RN and Post -op Vital signs reviewed and stable  Post vital signs: Reviewed and stable  Last Vitals:  Vitals:   04/08/17 1040 04/08/17 1044  BP: (!) 83/50 100/71  Pulse: 79 77  Resp: 16 19  Temp: (!) 36.3 C (!) 36.3 C  SpO2: 98% 100%    Last Pain:  Vitals:   04/08/17 0928  TempSrc: Tympanic         Complications: No apparent anesthesia complications

## 2017-04-08 NOTE — Anesthesia Procedure Notes (Signed)
Date/Time: 04/08/2017 10:03 AM Performed by: Darlyne Russian Pre-anesthesia Checklist: Patient identified, Emergency Drugs available, Suction available, Patient being monitored and Timeout performed Oxygen Delivery Method: Nasal cannula Placement Confirmation: positive ETCO2

## 2017-04-08 NOTE — OR Nursing (Signed)
Incomplete colon only got to 42-44 cm.

## 2017-04-08 NOTE — Anesthesia Post-op Follow-up Note (Signed)
Anesthesia QCDR form completed.        

## 2017-04-08 NOTE — Op Note (Signed)
[facility] Gastroenterology Patient Name: Isabella Robertson Procedure Date: 04/08/2017 10:03 AM MRN: [id number] Account #: [id number] Date of Birth: 15-Feb-1966 Admit Type: Outpatient Age: 51 Room: [facility] ENDO ROOM 4 Gender: Female Note Status: Finalized Procedure:            Colonoscopy Indications:          Screening for colorectal malignant neoplasm Providers:            Lollie Sails, MD Referring MD:         Angela Adam. Caryl Bis (Referring MD) Medicines:            Monitored Anesthesia Care Complications:        No immediate complications. Procedure:            Pre-Anesthesia Assessment:                       - ASA Grade Assessment: II - A patient with mild                        systemic disease.                       After obtaining informed consent, the colonoscope was                        passed under direct vision. Throughout the procedure,                        the patient's blood pressure, pulse, and oxygen                        saturations were monitored continuously. The                        Colonoscope was introduced through the anus with the                        intention of advancing to the cecum. The scope was                        advanced to the descending colon before the procedure                        was aborted. Medications were given. The quality of the                        bowel preparation was good. Findings:      A few small-mouthed diverticula were found in the sigmoid colon and       descending colon.      The sigmoid colon and descending colon were moderately tortuous.      the scope was advanced to about 42-44 cm where a sharp angulation was       encountered that I was unable to cross despite multiple maneuvers       including abdominal support, position changes and scope reintroduction.       The area adjacent to the angulation seemed tethered, possible adhesions?      Biopsies for histology were taken with a cold  forceps from the left       colon for evaluation of microscopic colitis.  The digital rectal exam was normal. Impression:           - Diverticulosis in the sigmoid colon and in the                        descending colon.                       - Tortuous colon.                       - Biopsies were taken with a cold forceps from the left                        colon for evaluation of microscopic colitis. Recommendation:       - Await pathology results.                       - Perform a virtual colonoscopy at appointment to be                        scheduled. Procedure Code(s):    --- Professional ---                       [phone], 33, Colonoscopy, flexible; with biopsy, single                        or multiple CPT copyright 2016 American [employer] Association. All rights reserved. The codes documented in this report are preliminary and upon coder review may  be revised to meet current compliance requirements. Lollie Sails, MD 04/08/2017 10:40:42 AM This report has been signed electronically. Number of Addenda: 0 Note Initiated On: 04/08/2017 10:03 AM Total Procedure Duration: 0 hours 24 minutes 38 seconds       [facility]

## 2017-04-08 NOTE — Anesthesia Postprocedure Evaluation (Signed)
Anesthesia Post Note  Patient: Isabella Robertson [name]  Procedure(s) Performed: COLONOSCOPY WITH PROPOFOL (N/A )  Patient location during evaluation: Endoscopy Anesthesia Type: General Level of consciousness: awake and alert Pain management: pain level controlled Vital Signs Assessment: post-procedure vital signs reviewed and stable Respiratory status: spontaneous breathing and respiratory function stable Cardiovascular status: stable Anesthetic complications: no     Last Vitals:  Vitals:   04/08/17 1050 04/08/17 1100  BP: 105/74 114/86  Pulse: 66 75  Resp: 17 15  Temp:    SpO2: 100% 98%    Last Pain:  Vitals:   04/08/17 0928  TempSrc: Tympanic                 KEPHART,WILLIAM K

## 2017-04-08 NOTE — Anesthesia Preprocedure Evaluation (Signed)
Anesthesia Evaluation  Patient identified by MRN, date of birth, ID band Patient awake    Reviewed: Allergy & Precautions, NPO status , Patient's Chart, lab work & pertinent test results  History of Anesthesia Complications Negative for: history of anesthetic complications  Airway Mallampati: II       Dental   Pulmonary asthma (laast inhaler months ago) ,           Cardiovascular (-) hypertension(-) Past MI and (-) CHF (-) dysrhythmias (-) Valvular Problems/Murmurs     Neuro/Psych neg Seizures    GI/Hepatic Neg liver ROS, GERD  Medicated,  Endo/Other  neg diabetes  Renal/GU negative Renal ROS     Musculoskeletal   Abdominal   Peds  Hematology   Anesthesia Other Findings   Reproductive/Obstetrics                             Anesthesia Physical Anesthesia Plan  ASA: II  Anesthesia Plan: General   Post-op Pain Management:    Induction:   PONV Risk Score and Plan: 3 and Ondansetron, Dexamethasone, Midazolam and Propofol infusion  Airway Management Planned: Nasal Cannula  Additional Equipment:   Intra-op Plan:   Post-operative Plan:   Informed Consent: I have reviewed the patients History and Physical, chart, labs and discussed the procedure including the risks, benefits and alternatives for the proposed anesthesia with the patient or authorized representative who has indicated his/her understanding and acceptance.     Plan Discussed with:   Anesthesia Plan Comments:         Anesthesia Quick Evaluation

## 2017-04-08 NOTE — H&P (Signed)
Outpatient short stay form Pre-procedure 04/08/2017 9:59 AM Lollie Sails MD  Primary Physician: Dr. Thersa Salt  Reason for visit:  Colonoscopy  History of present illness:  Patient is a 51 year old female presenting today as above. She has a history of chronic right-sided inguinal pain felt the past. Related to radiculopathy from a previous surgery in that area. She is also had incomplete colonoscopy in the past. We are proceeding today with a screening colonoscopy for further evaluation. She tolerated her prep well. She takes no aspirin or blood thinning agents.    Current Facility-Administered Medications:  .  0.9 %  sodium chloride infusion, , Intravenous, Continuous, Lollie Sails, MD, Last Rate: 20 mL/hr at 04/08/17 0957, 1,000 mL at 04/08/17 0957 .  0.9 %  sodium chloride infusion, , Intravenous, Continuous, Lollie Sails, MD  Prescriptions Prior to Admission  Medication Sig Dispense Refill Last Dose  . albuterol (PROVENTIL HFA;VENTOLIN HFA) 108 (90 Base) MCG/ACT inhaler Inhale 1-2 puffs into the lungs every 4 (four) hours as needed for wheezing or shortness of breath. 1 Inhaler 0 Taking  . butalbital-acetaminophen-caffeine (FIORICET, ESGIC) 50-325-40 MG tablet Take 1-2 tablets by mouth every 6 (six) hours as needed for headache. Do not exceed 6 tablets in 24 hours. 30 tablet 0 Taking  . cetirizine (ZYRTEC) 10 MG tablet Take 1 tablet (10 mg total) by mouth daily. Stop loratadine if restarting cetirizine 30 tablet 11 Taking  . fluticasone (FLONASE) 50 MCG/ACT nasal spray Place 1 spray into both nostrils 2 (two) times daily. 16 g 0 Taking  . meclizine (ANTIVERT) 25 MG tablet Take 1 tablet (25 mg total) by mouth 3 (three) times daily as needed for dizziness. 30 tablet 0   . montelukast (SINGULAIR) 10 MG tablet Take 1 tablet (10 mg total) by mouth at bedtime. 30 tablet 3 Taking  . omeprazole (PRILOSEC) 20 MG capsule TAKE 1 CAPSULE (20 MG TOTAL) BY MOUTH DAILY. NEEDS OFFICE  VISIT FOR FURTHER REFILLS 30 capsule 0 Taking  . sodium chloride (OCEAN) 0.65 % SOLN nasal spray Place 2 sprays into both nostrils every 2 (two) hours while awake.  0 Taking     Allergies  Allergen Reactions  . Shrimp [Shellfish Allergy]      Past Medical History:  Diagnosis Date  . Chronic abdominal pain   . Chronic constipation   . Diffuse cystic mastopathy   . Diverticulitis   . Endometriosis    History of; Now s/p hysterectomy.  . Helicobacter pylori (H. pylori) infection   . IBS (irritable bowel syndrome)     Review of systems:      Physical Exam    Heart and lungs: Regular rate and rhythm without rub or gallop, lungs are bilaterally clear.    HEENT: Normocephalic atraumatic eyes are anicteric    Other:     Pertinant exam for procedure: Soft nontender nondistended bowel sounds positive normoactive    Planned proceedures: Colonoscopy and indicated procedures. I have discussed the risks benefits and complications of procedures to include not limited to bleeding, infection, perforation and the risk of sedation and the patient wishes to proceed.    Lollie Sails, MD Gastroenterology 04/08/2017  9:59 AM

## 2017-04-11 ENCOUNTER — Encounter: Payer: Self-pay | Admitting: Gastroenterology

## 2017-04-11 ENCOUNTER — Ambulatory Visit: Payer: Self-pay | Admitting: *Deleted

## 2017-04-11 VITALS — BP 115/88 | HR 63 | Ht 62.0 in | Wt 146.0 lb

## 2017-04-11 DIAGNOSIS — Z0000 Encounter for general adult medical examination without abnormal findings: Secondary | ICD-10-CM

## 2017-04-11 LAB — SURGICAL PATHOLOGY

## 2017-04-11 NOTE — Progress Notes (Signed)
[employer] insurance premium discount evaluation: Labs Drawn. Replacements ROI form signed. Tobacco Free Attestation form signed.  Forms placed in paper chart.  Okay to route to new pcp. No appt made yet.

## 2017-04-12 ENCOUNTER — Other Ambulatory Visit: Payer: Self-pay | Admitting: Gastroenterology

## 2017-04-12 DIAGNOSIS — K589 Irritable bowel syndrome without diarrhea: Secondary | ICD-10-CM

## 2017-04-12 DIAGNOSIS — Z1211 Encounter for screening for malignant neoplasm of colon: Secondary | ICD-10-CM

## 2017-04-12 LAB — CMP12+LP+TP+TSH+6AC+CBC/D/PLT
A/G RATIO: 1.7 (ref 1.2–2.2)
ALK PHOS: 66 IU/L (ref 39–117)
ALT: 31 IU/L (ref 0–32)
AST: 29 IU/L (ref 0–40)
Albumin: 5 g/dL (ref 3.5–5.5)
BASOS: 1 %
BILIRUBIN TOTAL: 0.6 mg/dL (ref 0.0–1.2)
BUN/Creatinine Ratio: 12 (ref 9–23)
BUN: 11 mg/dL (ref 6–24)
Basophils Absolute: 0 10*3/uL (ref 0.0–0.2)
CHOL/HDL RATIO: 5.3 ratio — AB (ref 0.0–4.4)
Calcium: 9.8 mg/dL (ref 8.7–10.2)
Chloride: 99 mmol/L (ref 96–106)
Cholesterol, Total: 246 mg/dL — ABNORMAL HIGH (ref 100–199)
Creatinine, Ser: 0.89 mg/dL (ref 0.57–1.00)
EOS (ABSOLUTE): 0.1 10*3/uL (ref 0.0–0.4)
Eos: 4 %
Estimated CHD Risk: 1.5 times avg. — ABNORMAL HIGH (ref 0.0–1.0)
FREE THYROXINE INDEX: 2 (ref 1.2–4.9)
GFR calc Af Amer: 87 mL/min/{1.73_m2} (ref >59)
GFR calc non Af Amer: 75 mL/min/{1.73_m2} (ref >59)
GGT: 18 IU/L (ref 0–60)
GLOBULIN, TOTAL: 2.9 g/dL (ref 1.5–4.5)
GLUCOSE: 84 mg/dL (ref 65–99)
HDL: 46 mg/dL (ref >39)
HEMATOCRIT: 43.3 % (ref 34.0–46.6)
Hemoglobin: 14.8 g/dL (ref 11.1–15.9)
IMMATURE GRANULOCYTES: 0 %
Immature Grans (Abs): 0 10*3/uL (ref 0.0–0.1)
Iron: 83 ug/dL (ref 27–159)
LDH: 182 IU/L (ref 119–226)
LDL Calculated: 157 mg/dL — ABNORMAL HIGH (ref 0–99)
LYMPHS ABS: 1.5 10*3/uL (ref 0.7–3.1)
Lymphs: 41 %
MCH: 31.2 pg (ref 26.6–33.0)
MCHC: 34.2 g/dL (ref 31.5–35.7)
MCV: 91 fL (ref 79–97)
MONOS ABS: 0.4 10*3/uL (ref 0.1–0.9)
Monocytes: 10 %
NEUTROS ABS: 1.7 10*3/uL (ref 1.4–7.0)
NEUTROS PCT: 44 %
PHOSPHORUS: 3.7 mg/dL (ref 2.5–4.5)
POTASSIUM: 4.6 mmol/L (ref 3.5–5.2)
Platelets: 301 10*3/uL (ref 150–379)
RBC: 4.74 x10E6/uL (ref 3.77–5.28)
RDW: 12.9 % (ref 12.3–15.4)
SODIUM: 138 mmol/L (ref 134–144)
T3 UPTAKE RATIO: 29 % (ref 24–39)
T4, Total: 6.8 ug/dL (ref 4.5–12.0)
TRIGLYCERIDES: 216 mg/dL — AB (ref 0–149)
TSH: 1.85 u[IU]/mL (ref 0.450–4.500)
Total Protein: 7.9 g/dL (ref 6.0–8.5)
URIC ACID: 6 mg/dL (ref 2.5–7.1)
VLDL CHOLESTEROL CAL: 43 mg/dL — AB (ref 5–40)
WBC: 3.8 10*3/uL (ref 3.4–10.8)

## 2017-04-12 LAB — HGB A1C W/O EAG: HEMOGLOBIN A1C: 5.2 % (ref 4.8–5.6)

## 2017-04-12 NOTE — Progress Notes (Signed)
Results reviewed with pt. Lipids remain elevated, similar to previous. Pt endorses improved diet and walking more. BP elevated. Advised f/u with pcp re: lipids and BP. Diet and exercise recommendations for same discussed.   Pt finally received referral call from [facility]. 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 RN called multiple times as pt never received call back for appt. See notes regarding phone calls in paper chart. Appt Dec 6 with neuro.   Routine f/u with pcp. Copy of labs provided to pt. Results routed to pcp per pt request. Establishing new pcp care with Isabella Robertson since original provider left practice Isabella Robertson). No further questions/concerns.

## 2017-04-14 ENCOUNTER — Telehealth: Payer: Self-pay

## 2017-04-14 NOTE — Telephone Encounter (Signed)
Patient notified and would like to have her colonoscopy and endoscopy done before she makes any other appmts, she states she will call back to make appmt

## 2017-04-14 NOTE — Telephone Encounter (Signed)
Noted  

## 2017-04-14 NOTE — Telephone Encounter (Signed)
-----   Message from [name], MD sent at 04/14/2017  3:02 PM EDT ----- Regarding: Follow-up Please let the patient know that I reviewed her recent lab work. Her cholesterol is elevated. She needs to work on diet and exercise for that. We need to get her set up for an establish care visit. Thanks. Randall Hiss. ----- Message ----- From: Beckie Busing, RN Sent: 04/12/2017   2:45 PM To: [name], MD  Please see attached for Isabella Robertson's annual occ health labs drawn for insurance purposes. She is establishing care with you, transitioning from Dr. Lacinda Axon after he left the practice. These have been reviewed with the patient in our clinic. Please contact [facility] with any recommendations or concerns. Thank you!

## 2017-04-28 ENCOUNTER — Ambulatory Visit
Admission: RE | Admit: 2017-04-28 | Discharge: 2017-04-28 | Disposition: A | Discharge status: Home / Self Care | Payer: PRIVATE HEALTH INSURANCE | Source: Ambulatory Visit | Attending: Gastroenterology | Admitting: Gastroenterology

## 2017-04-28 DIAGNOSIS — K589 Irritable bowel syndrome without diarrhea: Secondary | ICD-10-CM

## 2017-04-28 DIAGNOSIS — Z1211 Encounter for screening for malignant neoplasm of colon: Secondary | ICD-10-CM

## 2017-05-02 ENCOUNTER — Other Ambulatory Visit: Payer: Self-pay | Admitting: Family Medicine

## 2017-05-02 DIAGNOSIS — Z1231 Encounter for screening mammogram for malignant neoplasm of breast: Secondary | ICD-10-CM

## 2017-05-05 ENCOUNTER — Ambulatory Visit
Admission: RE | Admit: 2017-05-05 | Discharge: 2017-05-05 | Disposition: A | Discharge status: Home / Self Care | Payer: PRIVATE HEALTH INSURANCE | Source: Ambulatory Visit | Attending: Family Medicine | Admitting: Family Medicine

## 2017-05-05 ENCOUNTER — Ambulatory Visit: Payer: PRIVATE HEALTH INSURANCE | Admitting: General Surgery

## 2017-05-05 DIAGNOSIS — Z1231 Encounter for screening mammogram for malignant neoplasm of breast: Secondary | ICD-10-CM | POA: Insufficient documentation

## 2017-05-05 DIAGNOSIS — R928 Other abnormal and inconclusive findings on diagnostic imaging of breast: Secondary | ICD-10-CM | POA: Diagnosis not present

## 2017-05-06 ENCOUNTER — Other Ambulatory Visit: Payer: Self-pay | Admitting: Family Medicine

## 2017-05-06 DIAGNOSIS — R928 Other abnormal and inconclusive findings on diagnostic imaging of breast: Secondary | ICD-10-CM

## 2017-05-06 DIAGNOSIS — N6310 Unspecified lump in the right breast, unspecified quadrant: Secondary | ICD-10-CM

## 2017-05-10 ENCOUNTER — Ambulatory Visit: Payer: Self-pay | Admitting: Registered Nurse

## 2017-05-10 VITALS — BP 107/85 | HR 74 | Temp 98.5°F

## 2017-05-10 DIAGNOSIS — R1031 Right lower quadrant pain: Secondary | ICD-10-CM

## 2017-05-10 DIAGNOSIS — N3001 Acute cystitis with hematuria: Secondary | ICD-10-CM

## 2017-05-10 DIAGNOSIS — R109 Unspecified abdominal pain: Secondary | ICD-10-CM

## 2017-05-10 DIAGNOSIS — R1032 Left lower quadrant pain: Secondary | ICD-10-CM

## 2017-05-10 LAB — POCT URINALYSIS DIPSTICK
Bilirubin, UA: NEGATIVE
Glucose, UA: NEGATIVE
Ketones, UA: NEGATIVE
NITRITE UA: NEGATIVE
PROTEIN UA: NEGATIVE mg/dL
Spec Grav, UA: 1.02 (ref 1.010–1.025)
UROBILINOGEN UA: 0.2 U/dL
pH, UA: 6 (ref 5.0–8.0)

## 2017-05-10 MED ORDER — SULFAMETHOXAZOLE-TRIMETHOPRIM 800-160 MG PO TABS: 1.0000 | ORAL_TABLET | Freq: Two times a day (BID) | ORAL | 0 refills | Status: AC

## 2017-05-10 MED ORDER — ACETAMINOPHEN 500 MG PO TABS: 1000.0000 mg | ORAL_TABLET | Freq: Four times a day (QID) | ORAL | 0 refills | Status: AC | PRN

## 2017-05-10 NOTE — Progress Notes (Signed)
Subjective:    Patient ID: Isabella Robertson, female    DOB: [birth year], 51 y.o.   MRN: [id number]  51y/o hispanic female established Pt reports R flank pain that has been going on intermittently "for years." Will normally wake up with it, but it goes away with lying in the bed a bit longer, and then being up and moving around getting ready. Today is different though, in that moving around is making the pain worse and it has never lasted this long. Describes pain as sharp, stabbing. Pt denies vomiting, dysuria, or constipation or diarrhea. Reports nausea, no emesis, which has only occurred with one other episode.   Patient reported recently finished CT colonoscopy told had diverticula and hernia last month.  No surgery required at this time.  Last BM approximately 1 hour ago brown formed.  Denied dysuria, blood red or black.  RLQ pain radiates to flank intemittently.  Hx endometriosis/strictures and that is why she had virtual colonoscopy they had problems getting scope to pass on previous attempt.  Hysteroscopy to decrease adhesions/scarring but they reformed.        Review of Systems  Constitutional: Negative for activity change, appetite change, chills, diaphoresis, fatigue and fever.  HENT: Negative for congestion, ear pain, mouth sores, nosebleeds, postnasal drip, rhinorrhea, sore throat, trouble swallowing and voice change.   Eyes: Negative for photophobia, pain, discharge and visual disturbance.  Respiratory: Negative for cough, chest tightness and wheezing.   Cardiovascular: Negative for chest pain and leg swelling.  Gastrointestinal: Positive for abdominal pain and nausea. Negative for abdominal distention, blood in stool, constipation, diarrhea and vomiting.  Endocrine: Negative for cold intolerance and heat intolerance.  Genitourinary: Positive for flank pain. Negative for decreased urine volume, difficulty urinating, dyspareunia, dysuria, enuresis, frequency, genital sores,  hematuria, menstrual problem, pelvic pain, urgency, vaginal bleeding, vaginal discharge and vaginal pain.  Musculoskeletal: Positive for back pain. Negative for arthralgias, gait problem, joint swelling, myalgias, neck pain and neck stiffness.  Skin: Negative for rash.  Allergic/Immunologic: Negative for environmental allergies, food allergies and immunocompromised state.  Neurological: Negative for dizziness, tremors, seizures, syncope, facial asymmetry, speech difficulty, weakness, light-headedness, numbness and headaches.  Hematological: Negative for adenopathy. Does not bruise/bleed easily.  Psychiatric/Behavioral: Negative for agitation, confusion and sleep disturbance. The patient is not nervous/anxious.        Objective:   Physical Exam  Constitutional: She is oriented to person, place, and time. Vital signs are normal. She appears well-developed and well-nourished. She is active and cooperative.  Non-toxic appearance. She does not have a sickly appearance. She does not appear ill. No distress.  HENT:  Head: Normocephalic and atraumatic.  Right Ear: Hearing and external ear normal.  Left Ear: Hearing and external ear normal.  Nose: Nose normal.  Mouth/Throat: Uvula is midline, oropharynx is clear and moist and mucous membranes are normal. No oropharyngeal exudate.  Eyes: Conjunctivae, EOM and lids are normal. Pupils are equal, round, and reactive to light. Right eye exhibits no discharge. Left eye exhibits no discharge. No scleral icterus.  Neck: Trachea normal, normal range of motion and phonation normal. Neck supple. No muscular tenderness present. No neck rigidity. No tracheal deviation, no edema, no erythema and normal range of motion present.  Cardiovascular: Normal rate, regular rhythm, normal heart sounds and intact distal pulses.  Pulses:      Radial pulses are 2+ on the right side, and 2+ on the left side.  Pulmonary/Chest: Effort normal and breath sounds normal. No accessory  muscle usage or stridor. No respiratory distress. She has no decreased breath sounds. She has no wheezes. She has no rhonchi. She has no rales. She exhibits no tenderness.  No cough observed in exam room  Abdominal: Soft. Normal appearance. She exhibits no shifting dullness, no distension, no pulsatile liver, no fluid wave, no abdominal bruit, no ascites, no pulsatile midline mass and no mass. Bowel sounds are decreased. There is no hepatosplenomegaly. There is tenderness in the right lower quadrant, suprapubic area and left lower quadrant. There is rebound and CVA tenderness. There is no rigidity, no guarding, no tenderness at McBurney's point and negative Murphy's sign. Hernia confirmed negative in the ventral area.  LLQ palpations radiates to right; RLQ exquisitely tender to palpation; LLQ slightly TTP worst pain with release; hypoactive bowel sounds x 4 quads; dull to percussion x 4 quads; right CVA tenderness persistant prior to and after percussion  Musculoskeletal: Normal range of motion. She exhibits no edema or deformity.       Right shoulder: Normal.       Left shoulder: Normal.       Right elbow: Normal.      Left elbow: Normal.       Right hip: Normal.       Left hip: Normal.       Right knee: Normal.       Left knee: Normal.       Cervical back: Normal.       Thoracic back: She exhibits tenderness and pain. She exhibits normal range of motion, no bony tenderness, no swelling, no edema, no deformity, no laceration, no spasm and normal pulse.       Lumbar back: Normal.       Right hand: Normal.       Left hand: Normal.  Lymphadenopathy:       Head (right side): No submental, no submandibular, no tonsillar, no preauricular, no posterior auricular and no occipital adenopathy present.       Head (left side): No submental, no submandibular, no tonsillar, no preauricular, no posterior auricular and no occipital adenopathy present.    She has no cervical adenopathy.       Right cervical:  No superficial cervical, no deep cervical and no posterior cervical adenopathy present.      Left cervical: No superficial cervical, no deep cervical and no posterior cervical adenopathy present.  Neurological: She is alert and oriented to person, place, and time. She has normal strength. She displays no atrophy and no tremor. No cranial nerve deficit or sensory deficit. She exhibits normal muscle tone. She displays no seizure activity. Coordination and gait normal. GCS eye subscore is 4. GCS verbal subscore is 5. GCS motor subscore is 6.  Sitting up on exam table slow; lying down normal; in/out of chair without difficulty; gait sure and steady in hall  Skin: Skin is warm, dry and intact. No abrasion, no bruising, no burn, no ecchymosis, no laceration, no lesion, no petechiae, no purpura and no rash noted. Rash is not macular, not papular, not maculopapular, not nodular, not pustular, not vesicular and not urticarial. She is not diaphoretic. No cyanosis or erythema. No pallor. Nails show no clubbing.  Psychiatric: She has a normal mood and affect. Her speech is normal and behavior is normal. Judgment and thought content normal. Cognition and memory are normal.  Nursing note and vitals reviewed.     Discussed urinalysis results with patient +blood and leukocytes wil treat for UTI.  Probably related  to recent colonoscopy prep.    Assessment & Plan:  A-abdomen pain acute, UTI acute  P-bactrim ds po BID X 3 days #40 RF0 dispensed from [facility] to patient.  Tylenol 1000mg  po QID prn pain 6 UD given to patient from clinic stock.  Medications as directed. Patient is also to push fluids. Hydrate, avoid dehydration. Avoid holding urine void on frequent basis every 4 to 6 hours. If unable to void every 8 hours follow up for re-evaluation with PCM, urgent care or ER. Call or return to clinic as needed if these symptoms worsen or fail to improve as anticipated. Exitcare handout on UTI given to patient Patient  verbalized agreement and understanding of treatment plan and had no further questions at this time.  P2: Hydrate and cranberry juice   Differential diagnosis: endometriosis flare, diverticulitis, appendicitis, hernia, kidney stone Discussed avoid nuts and seeds. Had rasberries this past week and likes cucumbers does not deseed. Exitcare handout abdominal pain, UTI, diverticulitis. Follow up for re-evaluation if symptoms do not completely resolve. Hydrate. Return to the clinic if symptoms persist or worsen; I have alerted the patient to call if high fever, unable to urinate every 8 hours, dehydration, marked weakness, fainting, increased abdominal pain, blood in stool or vomit. Patient verbalized agreement and understanding of treatment plan and had no further questions at this time.  P2: Hand washing and fitness

## 2017-05-10 NOTE — Patient Instructions (Addendum)
Abdominal Pain, Adult Many things can cause belly (abdominal) pain. Most times, belly pain is not dangerous. Many cases of belly pain can be watched and treated at home. Sometimes belly pain is serious, though. Your doctor will try to find the cause of your belly pain. Follow these instructions at home:  Take over-the-counter and prescription medicines only as told by your doctor. Do not take medicines that help you poop (laxatives) unless told to by your doctor.  Drink enough fluid to keep your pee (urine) clear or pale yellow.  Watch your belly pain for any changes.  Keep all follow-up visits as told by your doctor. This is important. Contact a doctor if:  Your belly pain changes or gets worse.  You are not hungry, or you lose weight without trying.  You are having trouble pooping (constipated) or have watery poop (diarrhea) for more than 2-3 days.  You have pain when you pee or poop.  Your belly pain wakes you up at night.  Your pain gets worse with meals, after eating, or with certain foods.  You are throwing up and cannot keep anything down.  You have a fever. Get help right away if:  Your pain does not go away as soon as your doctor says it should.  You cannot stop throwing up.  Your pain is only in areas of your belly, such as the right side or the left lower part of the belly.  You have bloody or black poop, or poop that looks like tar.  You have very bad pain, cramping, or bloating in your belly.  You have signs of not having enough fluid or water in your body (dehydration), such as: ? Dark pee, very little pee, or no pee. ? Cracked lips. ? Dry mouth. ? Sunken eyes. ? Sleepiness. ? Weakness. This information is not intended to replace advice given to you by your health care provider. Make sure you discuss any questions you have with your health care provider. Document Released: 12/08/2007 Document Revised: 01/09/2016 Document Reviewed: 12/03/2015 Elsevier  Interactive Patient Education  2017 Elsevier Inc. Diverticulitis Diverticulitis is when small pockets in your large intestine (colon) get infected or swollen. This causes stomach pain and watery poop (diarrhea). These pouches are called diverticula. They form in people who have a condition called diverticulosis. Follow these instructions at home: Medicines  Take over-the-counter and prescription medicines only as told by your doctor. These include: ? Antibiotics. ? Pain medicines. ? Fiber pills. ? Probiotics. ? Stool softeners.  Do not drive or use heavy machinery while taking prescription pain medicine.  If you were prescribed an antibiotic, take it as told. Do not stop taking it even if you feel better. General instructions  Follow a diet as told by your doctor.  When you feel better, your doctor may tell you to change your diet. You may need to eat a lot of fiber. Fiber makes it easier to poop (have bowel movements). Healthy foods with fiber include: ? Berries. ? Beans. ? Lentils. ? Green vegetables.  Exercise 3 or more times a week. Aim for 30 minutes each time. Exercise enough to sweat and make your heart beat faster.  Keep all follow-up visits as told. This is important. You may need to have an exam of the large intestine. This is called a colonoscopy. Contact a doctor if:  Your pain does not get better.  You have a hard time eating or drinking.  You are not pooping like normal. Get help  right away if:  Your pain gets worse.  Your problems do not get better.  Your problems get worse very fast.  You have a fever.  You throw up (vomit) more than one time.  You have poop that is: ? Bloody. ? Black. ? Tarry. Summary  Diverticulitis is when small pockets in your large intestine (colon) get infected or swollen.  Take medicines only as told by your doctor.  Follow a diet as told by your doctor. This information is not intended to replace advice given to you  by your health care provider. Make sure you discuss any questions you have with your health care provider. Document Released: 12/08/2007 Document Revised: 07/08/2016 Document Reviewed: 07/08/2016 Elsevier Interactive Patient Education  2017 Elsevier Inc. Urinary Tract Infection, Adult A urinary tract infection (UTI) is an infection of any part of the urinary tract. The urinary tract includes the:  Kidneys.  Ureters.  Bladder.  Urethra.  These organs make, store, and get rid of pee (urine) in the body. Follow these instructions at home:  Take over-the-counter and prescription medicines only as told by your doctor.  If you were prescribed an antibiotic medicine, take it as told by your doctor. Do not stop taking the antibiotic even if you start to feel better.  Avoid the following drinks: ? Alcohol. ? Caffeine. ? Tea. ? Carbonated drinks.  Drink enough fluid to keep your pee clear or pale yellow.  Keep all follow-up visits as told by your doctor. This is important.  Make sure to: ? Empty your bladder often and completely. Do not to hold pee for long periods of time. ? Empty your bladder before and after sex. ? Wipe from front to back after a bowel movement if you are female. Use each tissue one time when you wipe. Contact a doctor if:  You have back pain.  You have a fever.  You feel sick to your stomach (nauseous).  You throw up (vomit).  Your symptoms do not get better after 3 days.  Your symptoms go away and then come back. Get help right away if:  You have very bad back pain.  You have very bad lower belly (abdominal) pain.  You are throwing up and cannot keep down any medicines or water. This information is not intended to replace advice given to you by your health care provider. Make sure you discuss any questions you have with your health care provider. Document Released: 12/08/2007 Document Revised: 11/27/2015 Document Reviewed: 05/12/2015 Elsevier  Interactive Patient Education  2018 [employer].  Kidney Stones Kidney stones (urolithiasis) are rock-like masses that form inside of the kidneys. Kidneys are organs that make pee (urine). A kidney stone can cause very bad pain and can block the flow of pee. The stone usually leaves your body (passes) through your pee. You may need to have a doctor take out the stone. Follow these instructions at home: Eating and drinking  Drink enough fluid to keep your pee clear or pale yellow. This will help you pass the stone.  If told by your doctor, change the foods you eat (your diet). This may include: ? Limiting how much salt (sodium) you eat. ? Eating more fruits and vegetables. ? Limiting how much meat, poultry, fish, and eggs you eat.  Follow instructions from your doctor about eating or drinking restrictions. General instructions  Collect pee samples as told by your doctor. You may need to collect a pee sample: ? 24 hours after a stone comes  out. ? 8-12 weeks after a stone comes out, and every 6-12 months after that.  Strain your pee every time you pee (urinate), for as long as told. Use the strainer that your doctor recommends.  Do not throw out the stone. Keep it so that it can be tested by your doctor.  Take over-the-counter and prescription medicines only as told by your doctor.  Keep all follow-up visits as told by your doctor. This is important. You may need follow-up tests. Preventing kidney stones To prevent another kidney stone:  Drink enough fluid to keep your pee clear or pale yellow. This is the best way to prevent kidney stones.  Eat healthy foods.  Avoid certain foods as told by your doctor. You may be told to eat less protein.  Stay at a healthy weight.  Contact a doctor if:  You have pain that gets worse or does not get better with medicine. Get help right away if:  You have a fever or chills.  You get very bad pain.  You get new pain in your belly  (abdomen).  You pass out (faint).  You cannot pee. This information is not intended to replace advice given to you by your health care provider. Make sure you discuss any questions you have with your health care provider. Document Released: 12/08/2007 Document Revised: 03/09/2016 Document Reviewed: 03/09/2016 Elsevier Interactive Patient Education  2017 [employer].

## 2017-05-18 ENCOUNTER — Ambulatory Visit
Admission: RE | Admit: 2017-05-18 | Discharge: 2017-05-18 | Disposition: A | Discharge status: Home / Self Care | Payer: PRIVATE HEALTH INSURANCE | Source: Ambulatory Visit | Attending: Family Medicine | Admitting: Family Medicine

## 2017-05-18 DIAGNOSIS — N6001 Solitary cyst of right breast: Secondary | ICD-10-CM | POA: Insufficient documentation

## 2017-05-18 DIAGNOSIS — R928 Other abnormal and inconclusive findings on diagnostic imaging of breast: Secondary | ICD-10-CM | POA: Insufficient documentation

## 2017-05-18 DIAGNOSIS — N6310 Unspecified lump in the right breast, unspecified quadrant: Secondary | ICD-10-CM

## 2017-05-19 ENCOUNTER — Encounter: Payer: Self-pay | Admitting: *Deleted

## 2017-06-09 ENCOUNTER — Ambulatory Visit: Payer: PRIVATE HEALTH INSURANCE | Admitting: Neurology

## 2017-06-09 ENCOUNTER — Telehealth: Payer: Self-pay

## 2017-06-09 NOTE — Telephone Encounter (Signed)
Message left for the patient to call back to reschedule her appointment with Dr Jamal Collin she missed.

## 2017-06-20 ENCOUNTER — Ambulatory Visit: Payer: No Typology Code available for payment source | Admitting: Family Medicine

## 2017-06-20 ENCOUNTER — Encounter: Payer: Self-pay | Admitting: Family Medicine

## 2017-06-20 VITALS — HR 68 | Temp 97.8°F | Resp 16 | Wt 143.5 lb

## 2017-06-20 DIAGNOSIS — R109 Unspecified abdominal pain: Secondary | ICD-10-CM | POA: Diagnosis not present

## 2017-06-20 DIAGNOSIS — R300 Dysuria: Secondary | ICD-10-CM | POA: Diagnosis not present

## 2017-06-20 LAB — POCT URINALYSIS DIPSTICK
BILIRUBIN UA: NEGATIVE
Glucose, UA: NEGATIVE
Ketones, UA: NEGATIVE
NITRITE UA: NEGATIVE
PH UA: 5 (ref 5.0–8.0)
PROTEIN UA: NEGATIVE
Spec Grav, UA: 1.02 (ref 1.010–1.025)
UROBILINOGEN UA: 0.2 U/dL

## 2017-06-20 LAB — COMPREHENSIVE METABOLIC PANEL
ALBUMIN: 4.6 g/dL (ref 3.5–5.2)
ALT: 19 U/L (ref 0–35)
AST: 21 U/L (ref 0–37)
Alkaline Phosphatase: 59 U/L (ref 39–117)
BUN: 13 mg/dL (ref 6–23)
CALCIUM: 9.7 mg/dL (ref 8.4–10.5)
CHLORIDE: 103 meq/L (ref 96–112)
CO2: 28 meq/L (ref 19–32)
Creatinine, Ser: 0.77 mg/dL (ref 0.40–1.20)
GFR: 83.93 mL/min (ref >60.00)
Glucose, Bld: 89 mg/dL (ref 70–99)
POTASSIUM: 4.2 meq/L (ref 3.5–5.1)
Sodium: 137 mEq/L (ref 135–145)
Total Bilirubin: 0.9 mg/dL (ref 0.2–1.2)
Total Protein: 8.1 g/dL (ref 6.0–8.3)

## 2017-06-20 LAB — LIPASE: LIPASE: 17 U/L (ref 11.0–59.0)

## 2017-06-20 LAB — URINALYSIS, MICROSCOPIC ONLY

## 2017-06-20 MED ORDER — NITROFURANTOIN MONOHYD MACRO 100 MG PO CAPS: 100.0000 mg | ORAL_CAPSULE | Freq: Two times a day (BID) | ORAL | 0 refills | Status: DC

## 2017-06-20 MED ORDER — OMEPRAZOLE 20 MG PO CPDR: 20.0000 mg | DELAYED_RELEASE_CAPSULE | Freq: Every day | ORAL | 0 refills | Status: DC

## 2017-06-20 NOTE — Patient Instructions (Addendum)
Please take antibiotic as directed for urine. Also, avoid triggers for reflux such as aspirin, ibuprofen, alcohol, nicotine, caffeine, and peppermint.   We have ordered labs or studies at this visit. It can take up to 1-2 weeks for results and processing. IF results require follow up or explanation, we will call you with instructions. Clinically stable results will be released to your [facility]. If you have not heard from [place] or cannot find your results in [facility] in 2 weeks please contact our office at [phone].  If you are not yet signed up for [facility], please consider signing up   Follow up with your provider and GI provider as scheduled next month.  If symptoms do not improve with treatment, worsen, or you develop new symptoms such as fever or increasing pain please seek medical attention.   Food Choices for Gastroesophageal Reflux Disease, Adult When you have gastroesophageal reflux disease (GERD), the foods you eat and your eating habits are very important. Choosing the right foods can help ease your discomfort. What guidelines do I need to follow?  Choose fruits, vegetables, whole grains, and low-fat dairy products.  Choose low-fat meat, fish, and poultry.  Limit fats such as oils, salad dressings, butter, nuts, and avocado.  Keep a food diary. This helps you identify foods that cause symptoms.  Avoid foods that cause symptoms. These may be different for everyone.  Eat small meals often instead of 3 large meals a day.  Eat your meals slowly, in a place where you are relaxed.  Limit fried foods.  Cook foods using methods other than frying.  Avoid drinking alcohol.  Avoid drinking large amounts of liquids with your meals.  Avoid bending over or lying down until 2-3 hours after eating. What foods are not recommended? These are some foods and drinks that may make your symptoms worse: Vegetables Tomatoes. Tomato juice. Tomato and spaghetti sauce. Chili peppers. Onion and  garlic. Horseradish. Fruits Oranges, grapefruit, and lemon (fruit and juice). Meats High-fat meats, fish, and poultry. This includes hot dogs, ribs, ham, sausage, salami, and bacon. Dairy Whole milk and chocolate milk. Sour cream. Cream. Butter. Ice cream. Cream cheese. Drinks Coffee and tea. Bubbly (carbonated) drinks or energy drinks. Condiments Hot sauce. Barbecue sauce. Sweets/Desserts Chocolate and cocoa. Donuts. Peppermint and spearmint. Fats and Oils High-fat foods. This includes [place] fries and potato chips. Other Vinegar. Strong spices. This includes black pepper, white pepper, red pepper, cayenne, curry powder, cloves, ginger, and chili powder. The items listed above may not be a complete list of foods and drinks to avoid. Contact your dietitian for more information. This information is not intended to replace advice given to you by your health care provider. Make sure you discuss any questions you have with your health care provider. Document Released: 12/21/2011 Document Revised: 11/27/2015 Document Reviewed: 04/25/2013 Elsevier Interactive Patient Education  2017 [place].  Abdominal Pain, Adult Many things can cause belly (abdominal) pain. Most times, belly pain is not dangerous. Many cases of belly pain can be watched and treated at home. Sometimes belly pain is serious, though. Your doctor will try to find the cause of your belly pain. Follow these instructions at home:  Take over-the-counter and prescription medicines only as told by your doctor. Do not take medicines that help you poop (laxatives) unless told to by your doctor.  Drink enough fluid to keep your pee (urine) clear or pale yellow.  Watch your belly pain for any changes.  Keep all follow-up visits as told  by your doctor. This is important. Contact a doctor if:  Your belly pain changes or gets worse.  You are not hungry, or you lose weight without trying.  You are having trouble pooping  (constipated) or have watery poop (diarrhea) for more than 2-3 days.  You have pain when you pee or poop.  Your belly pain wakes you up at night.  Your pain gets worse with meals, after eating, or with certain foods.  You are throwing up and cannot keep anything down.  You have a fever. Get help right away if:  Your pain does not go away as soon as your doctor says it should.  You cannot stop throwing up.  Your pain is only in areas of your belly, such as the right side or the left lower part of the belly.  You have bloody or black poop, or poop that looks like tar.  You have very bad pain, cramping, or bloating in your belly.  You have signs of not having enough fluid or water in your body (dehydration), such as: ? Dark pee, very little pee, or no pee. ? Cracked lips. ? Dry mouth. ? Sunken eyes. ? Sleepiness. ? Weakness. This information is not intended to replace advice given to you by your health care provider. Make sure you discuss any questions you have with your health care provider. Document Released: 12/08/2007 Document Revised: 01/09/2016 Document Reviewed: 12/03/2015 Elsevier Interactive Patient Education  2017 [employer].

## 2017-06-20 NOTE — Progress Notes (Signed)
Subjective:    Patient ID: Isabella Robertson, female    DOB: [birth year], 51 y.o.   MRN: [id number]  HPI  Isabella Robertson is a 51 year old female who presents today with abdominal pain that has been present for "a log time" "years" She was evaluated on 05/10/17 for similar symptoms and UA indicated a UTI. She was treated with Bactrim DS x 3 days and Tylenol for discomfort.  She reports that symptoms improved after treatment with Bactrim however symptoms of dysuria and abdominal pain have been present intermittently again. Reports that movement with her work by lifting and bending can make symptoms worse but abdominal pain is also present with eating and is usually associated with eating.  Tums has provided moderate benefit. She tries to avoid dairy products as these can exacerbate symptoms. She is followed by GI. CT colonoscopy on 04/28/17 noted few sigmoid diverticula. She has a follow up with GI in one month.   Abdominal Pain: Location: diffuse  Onset/Timing: with eating  Duration: She is unclear about duration; approximately 30 minutes  Severity/Quality: 7 or 8 after eating; Pain improves to approximately 5 in between meals but can decrease further. She is not clear about how much this can improve. Worse/Better by: Eating or movement can make symptoms worse, Tums has improved symptoms.   Associated Symptoms: Nausea without vomiting,   Dysuria present; She denies urinary frequency, urgency, hematuria, fever or flank pain. No diarrhea reported  History of H. Pylori, gastric ulcer, and irritable bowel syndrome are noted. History of IBS that has been uncontrolled and she is also followed by GI for these problems.  ROS Loss of appetite: No Vomiting: No Diarrhea: intermittently but is not present at this time.  Rectal bleeding: No  Fever: No Weight loss: No Wt Readings from Last 3 Encounters:  06/20/17 143 lb 8 oz (65.1 kg)  04/11/17 146 lb (66.2 kg)  04/08/17 143 lb (64.9 kg)     Meds, Vitals, and allergies reviewed   Review of Systems  Constitutional: Negative for chills, fatigue and fever.  Respiratory: Negative for cough, shortness of breath and wheezing.   Cardiovascular: Negative for chest pain and palpitations.  Gastrointestinal: Positive for abdominal pain, diarrhea and nausea. Negative for vomiting.  Genitourinary: Positive for dysuria. Negative for flank pain, frequency and hematuria.  Musculoskeletal: Negative for myalgias.  Skin: Negative for rash.  Neurological: Negative for dizziness, weakness, light-headedness and headaches.  Psychiatric/Behavioral:       Denies depressed or anxious mood   Past Medical History:  Diagnosis Date  . Chronic abdominal pain   . Chronic constipation   . Diffuse cystic mastopathy   . Diverticulitis   . Endometriosis    History of; Now s/p hysterectomy.  . Helicobacter pylori (H. pylori) infection   . IBS (irritable bowel syndrome)      Social History   Socioeconomic History  . Marital status: Married    Spouse name: Not on file  . Number of children: Not on file  . Years of education: Not on file  . Highest education level: Not on file  Social Needs  . Financial resource strain: Not on file  . Food insecurity - worry: Not on file  . Food insecurity - inability: Not on file  . Transportation needs - medical: Not on file  . Transportation needs - non-medical: Not on file  Occupational History  . Not on file  Tobacco Use  . Smoking status: Never Smoker  . Smokeless  tobacco: Never Used  Substance and Sexual Activity  . Alcohol use: No    Alcohol/week: 0.0 oz  . Drug use: No  . Sexual activity: Yes  Other Topics Concern  . Not on file  Social History Narrative  . Not on file    Past Surgical History:  Procedure Laterality Date  . ABDOMINAL HYSTERECTOMY  2007  . CESAREAN SECTION  2003  . COLONOSCOPY  2010  . COLONOSCOPY WITH PROPOFOL N/A 04/08/2017   Procedure: COLONOSCOPY WITH PROPOFOL;   Surgeon: Lollie Sails, MD;  Location: [facility] ENDOSCOPY;  Service: Endoscopy;  Laterality: N/A;  . ECTOPIC PREGNANCY SURGERY  [id number]  . EYE SURGERY  2003  . HERNIA REPAIR  age 39    Family History  Problem Relation Age of Onset  . Breast cancer Paternal Aunt   . Breast cancer Mother 71  . Breast cancer Cousin        breast - paternal side    Allergies  Allergen Reactions  . Shrimp [Shellfish Allergy]     Current Outpatient Medications on File Prior to Visit  Medication Sig Dispense Refill  . albuterol (PROVENTIL HFA;VENTOLIN HFA) 108 (90 Base) MCG/ACT inhaler Inhale 1-2 puffs into the lungs every 4 (four) hours as needed for wheezing or shortness of breath. 1 Inhaler 0  . cetirizine (ZYRTEC) 10 MG tablet Take 10 mg daily as needed by mouth.    . fluticasone (FLONASE) 50 MCG/ACT nasal spray Place 1 spray 2 (two) times daily as needed into both nostrils.     No current facility-administered medications on file prior to visit.     Pulse 68   Temp 97.8 F (36.6 C) (Oral)   Resp 16   Wt 143 lb 8 oz (65.1 kg)   SpO2 97%   BMI 26.25 kg/m        Objective:   Physical Exam  Constitutional: She is oriented to person, place, and time. She appears well-developed and well-nourished.  Eyes: Pupils are equal, round, and reactive to light. No scleral icterus.  Neck: Neck supple.  Cardiovascular: Normal rate, regular rhythm and intact distal pulses.  Pulmonary/Chest: Effort normal and breath sounds normal. She has no wheezes. She has no rales.  Abdominal: Soft. Bowel sounds are normal. There is no hepatosplenomegaly. There is no rigidity, no guarding, no CVA tenderness, no tenderness at McBurney's point and negative Murphy's sign.  No suprapubic tenderness noted. Mild tenderness to palpation present across the abdomen and not generalized to one area.   Musculoskeletal: She exhibits no edema.  Lymphadenopathy:    She has no cervical adenopathy.  Neurological: She is alert and  oriented to person, place, and time.  Skin: Skin is warm and dry. No rash noted.  Psychiatric: She has a normal mood and affect. Her behavior is normal. Judgment and thought content normal.       Assessment & Plan:  1. Dysuria UA is positive for trace of leukocytes and blood. Prior UA one month ago noted also positive for leukocytes and blood. Symptom of dysuria present and UA has not returned to normal; will treat with nitrofurantoin and send for culture. Advised follow up if symptoms do not improve with treatment, worsen, or she develops N/V, fever, flank pain, or blood in her urine; further evaluation advised.  - POCT Urinalysis Dipstick - Urine Microscopic Only - Urine Culture  2. Abdominal pain, unspecified abdominal location Source of pain is unclear at this time. Mild generalized discomfort noted; suspect that food triggers  may have exacerbated symptoms of IBS; abdominal exam is reassuring; will obtain lab work today and also initiated omeprazole today as tums has provided benefit and patient has received benefit from omeprazole previously per her report. We discussed foods to avoid for GERD and advised her to keep a a diary of her foods and symptoms to determine if there is an association. She has a follow up appointment with her PCP and GI provider next month. Advised her to keep these appointments and bring diary of foods and any associated symptoms to these appointments.  Written precautions for provided for abdominal pain.  - CMP - Lipase  Delano Metz, FNP-C

## 2017-06-21 ENCOUNTER — Telehealth: Payer: Self-pay

## 2017-06-21 ENCOUNTER — Other Ambulatory Visit: Payer: Self-pay | Admitting: Family Medicine

## 2017-06-21 LAB — URINE CULTURE
MICRO NUMBER:: 81414738
Result:: NO GROWTH
SPECIMEN QUALITY:: ADEQUATE

## 2017-06-21 MED ORDER — FLUCONAZOLE 150 MG PO TABS: 150.0000 mg | ORAL_TABLET | Freq: Once | ORAL | 0 refills | Status: AC

## 2017-06-21 NOTE — Telephone Encounter (Signed)
Originally sent as staff mesg, but placed as part of [employer] for further f/u:      Hi Ladies.  This is regarding the phone encounter from today referencing possible FMLA paperwork request from [employer].   I am the [place] in [place], [employer] employer. She came to me today asking for help with this situation. Due to her limited English and strong accent, I can definitely understand how it is difficult to understand what she was calling for earlier. So I'm hoping I can help translate.   She has reported to me that she is normally in a work position that is more desk work and requires little movement. During the holidays, as this is a very busy time for the company, many employees are cross-trained and expected to work other positions. In her case, she has been moved to [place]. 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   She is reporting that all of the movement is what causes her sx, like nausea, that she was seen for on 06/20/17. She was told by HR and her supervisor that she needed a note from her primary doctor listing any restrictions if she feels she cannot perform the job duties.   Ultimately, she is requesting a note from your office listing restrictions such as no climbing ladders, or bending or stooping, basically desk work only. From what I understand, she has a follow up with you in January and is seeking that the restrictions are in place until her next follow up. If once this is completed, and if for some reason, does not help the sx, she has been given the information on FMLA from HR, but they are reporting that is not necessary just yet.   To be clear, I am not personally asking for the restrictions for her. I have no control over, or say in, restrictions or job capacities. My goal is just to help clarify a bit so you can follow up with [place] with  more complete information. Please let me know if you have any other questions.   Thank you,  Cheri Guppy, RN BSN

## 2017-06-21 NOTE — Telephone Encounter (Signed)
Did she drop these off yesterday?   Copied from [place] [id number]. Topic: General - Other >> Jun 21, 2017  9:40 AM Neva Seat wrote: Pt is very difficult to understand.  From what I can gather from her request, she is needing FMLA paperwork from [employer] for her work.  Pt was seen by her on Mon 12-17.  Please call pt back to assist her with her questions.

## 2017-06-21 NOTE — Progress Notes (Signed)
Patient notified and voiced understanding.

## 2017-06-21 NOTE — Progress Notes (Signed)
Microscopic urine is + for yeast; fluconazole 150 mg tablet x 1 provided. Continue nitrofurantoin as directed; urine culture results are pending.

## 2017-06-22 NOTE — Telephone Encounter (Signed)
She should be evaluated by her PCP for this concern. I saw her for an acute visit and provided her with a note regarding work for that day.  Please make an appointment for her to be evaluated for symptoms that was mentioned in the message.

## 2017-06-22 NOTE — Telephone Encounter (Signed)
Left message for patient to return call to office PEC may schedule patient with PCP to address concerns in previous CRM. See note from NP.

## 2017-06-22 NOTE — Telephone Encounter (Signed)
Pt called back requesting appt asap, tomorrow preferably. Pt states she is having too much pain to continue to work.  Pt is also having stomach pain. Pt is going to try to complete FMLA request on line tonight.  States she did not fell well enough to complete last night. No appts with PCP. Please call if you can work her in and override the dr schedule.  Pt has appt 1/07, but states she cannot wait that long.

## 2017-06-23 NOTE — Telephone Encounter (Signed)
Patient has appointment with Dr. Aundra Dubin @ 1330 tomorrow.

## 2017-06-24 ENCOUNTER — Ambulatory Visit: Payer: No Typology Code available for payment source | Admitting: Internal Medicine

## 2017-06-24 ENCOUNTER — Encounter: Payer: Self-pay | Admitting: Internal Medicine

## 2017-06-24 VITALS — BP 100/76 | HR 84 | Temp 98.1°F | Ht 63.0 in | Wt 141.5 lb

## 2017-06-24 DIAGNOSIS — K219 Gastro-esophageal reflux disease without esophagitis: Secondary | ICD-10-CM

## 2017-06-24 DIAGNOSIS — R110 Nausea: Secondary | ICD-10-CM

## 2017-06-24 DIAGNOSIS — K580 Irritable bowel syndrome with diarrhea: Secondary | ICD-10-CM | POA: Diagnosis not present

## 2017-06-24 DIAGNOSIS — R109 Unspecified abdominal pain: Secondary | ICD-10-CM

## 2017-06-24 DIAGNOSIS — Z8619 Personal history of other infectious and parasitic diseases: Secondary | ICD-10-CM | POA: Diagnosis not present

## 2017-06-24 DIAGNOSIS — K588 Other irritable bowel syndrome: Secondary | ICD-10-CM

## 2017-06-24 DIAGNOSIS — R1084 Generalized abdominal pain: Secondary | ICD-10-CM

## 2017-06-24 DIAGNOSIS — G8929 Other chronic pain: Secondary | ICD-10-CM | POA: Diagnosis not present

## 2017-06-24 DIAGNOSIS — Z8719 Personal history of other diseases of the digestive system: Secondary | ICD-10-CM | POA: Diagnosis not present

## 2017-06-24 DIAGNOSIS — Z8711 Personal history of peptic ulcer disease: Secondary | ICD-10-CM

## 2017-06-24 MED ORDER — DICYCLOMINE HCL 20 MG PO TABS: 20.0000 mg | ORAL_TABLET | Freq: Three times a day (TID) | ORAL | 0 refills | Status: DC

## 2017-06-24 MED ORDER — SUCRALFATE 1 G PO TABS: 1.0000 g | ORAL_TABLET | Freq: Three times a day (TID) | ORAL | 0 refills | Status: DC

## 2017-06-24 NOTE — Progress Notes (Signed)
Chief Complaint  Patient presents with  . Abdominal Pain    chronic with IBS D   F/u with daughter  1. She reports ab pain 10/10 3 am to 5 am could walk radiates down leg feels like something inside and pain worse in lower abdomen and RLQ. This pain is chronic and intermittent and she has IBS D. Pain causes trouble with walking and movement makes worse. 6-7/10 assoc with nausea/diarrhea. She has 2-3 episodes of diarrhea daily. Pain is limiting her work and she has to leave early or call out. She does report red blood in stool 2 weeks ago. Food makes abdominal pain worse and she feels like burning with food esp upper abdomen area. She reports she has been tx'ed for Hpylori x 3 days but has side effects with the antibiotics. She just had CT scan 04/2017 negative and colonoscopy 04/08/17 which showed diverticulosis and tortuous colon.   She wants FMLA form filled out for work for chronic abdominal pain but deferred to GI and will make f/u with GI   Review of Systems  Respiratory: Negative for shortness of breath.   Cardiovascular: Negative for chest pain.  Gastrointestinal: Positive for abdominal pain, diarrhea and nausea.  Skin: Negative for rash.  Psychiatric/Behavioral: Negative for memory loss.   Past Medical History:  Diagnosis Date  . Chronic abdominal pain   . Chronic abdominal pain   . Chronic constipation   . Diffuse cystic mastopathy   . Diverticulitis   . Endometriosis    History of; Now s/p hysterectomy.  . Helicobacter pylori (H. pylori) infection    x 3 per as of 06/2017   . IBS (irritable bowel syndrome)    diarrhea type per pt as of 06/2017    Past Surgical History:  Procedure Laterality Date  . ABDOMINAL HYSTERECTOMY  2007  . CESAREAN SECTION  2003  . COLONOSCOPY  2010  . COLONOSCOPY WITH PROPOFOL N/A 04/08/2017   Procedure: COLONOSCOPY WITH PROPOFOL;  Surgeon: Lollie Sails, MD;  Location: [facility] ENDOSCOPY;  Service: Endoscopy;  Laterality: N/A;  . ECTOPIC  PREGNANCY SURGERY  [phone]  . EYE SURGERY  2003  . HERNIA REPAIR  age 66   Family History  Problem Relation Age of Onset  . Breast cancer Paternal Aunt   . Breast cancer Mother 35  . Breast cancer Cousin        breast - paternal side   Social History   Socioeconomic History  . Marital status: Married    Spouse name: Not on file  . Number of children: Not on file  . Years of education: Not on file  . Highest education level: Not on file  Social Needs  . Financial resource strain: Not on file  . Food insecurity - worry: Not on file  . Food insecurity - inability: Not on file  . Transportation needs - medical: Not on file  . Transportation needs - non-medical: Not on file  Occupational History  . Not on file  Tobacco Use  . Smoking status: Never Smoker  . Smokeless tobacco: Never Used  Substance and Sexual Activity  . Alcohol use: No    Alcohol/week: 0.0 oz  . Drug use: No  . Sexual activity: Yes  Other Topics Concern  . Not on file  Social History Narrative  . Not on file   Current Meds  Medication Sig  . albuterol (PROVENTIL HFA;VENTOLIN HFA) 108 (90 Base) MCG/ACT inhaler Inhale 1-2 puffs into the lungs every 4 (four)  hours as needed for wheezing or shortness of breath.  . cetirizine (ZYRTEC) 10 MG tablet Take 10 mg daily as needed by mouth.  . fluticasone (FLONASE) 50 MCG/ACT nasal spray Place 1 spray 2 (two) times daily as needed into both nostrils.  . nitrofurantoin, macrocrystal-monohydrate, (MACROBID) 100 MG capsule Take 1 capsule (100 mg total) by mouth 2 (two) times daily.  Marland Kitchen omeprazole (PRILOSEC) 20 MG capsule Take 1 capsule (20 mg total) by mouth daily.   Allergies  Allergen Reactions  . Shrimp [Shellfish Allergy]    Recent Results (from the past 2160 hour(s))  Surgical pathology     Status: None   Collection Time: 04/08/17 10:35 AM  Result Value Ref Range   SURGICAL PATHOLOGY      Surgical Pathology CASE: [id number] PATIENT: Isabella Robertson  Isabella Robertson Surgical Pathology Report     SPECIMEN SUBMITTED: A. Colon, left; cbx  CLINICAL HISTORY: None provided  PRE-OPERATIVE DIAGNOSIS: Screening  POST-OPERATIVE DIAGNOSIS: Diverticulosis     DIAGNOSIS: A.  LEFT COLON; COLD BIOPSY: - NO PATHOLOGIC CHANGE.   GROSS DESCRIPTION:  A. Labeled: C BX left colon  Tissue fragment(s): 1  Size: 0.4 cm  Description: pink-tan fragment  Entirely submitted in 1 cassette(s).    Final Diagnosis performed by Delorse Lek, MD.  Electronically signed 04/11/2017 10:00:14AM    The electronic signature indicates that the named Attending Pathologist has evaluated the specimen  Technical component performed at [facility], [address], [place], [place] [address] Lab: [phone] Dir: Darrick Penna. Evette Doffing, MD  Professional component performed at [facility], [facility], [place], [place], [place] [address] Lab: [phone] Dir: Dellia Nims. Reuel Derby, MD    Executive Panel     Status: Abnormal   Collection Time: 04/11/17  8:50 AM  Result Value Ref Range   Glucose 84 65 - 99 mg/dL   Uric Acid 6.0 2.5 - 7.1 mg/dL    Comment:            Therapeutic target for gout patients: <6.0   BUN 11 6 - 24 mg/dL   Creatinine, Ser 0.89 0.57 - 1.00 mg/dL   GFR calc non Af Amer 75 >59 mL/min/1.73   GFR calc Af Amer 87 >59 mL/min/1.73   BUN/Creatinine Ratio 12 9 - 23   Sodium 138 134 - 144 mmol/L   Potassium 4.6 3.5 - 5.2 mmol/L   Chloride 99 96 - 106 mmol/L   Calcium 9.8 8.7 - 10.2 mg/dL   Phosphorus 3.7 2.5 - 4.5 mg/dL   Total Protein 7.9 6.0 - 8.5 g/dL   Albumin 5.0 3.5 - 5.5 g/dL   Globulin, Total 2.9 1.5 - 4.5 g/dL   Albumin/Globulin Ratio 1.7 1.2 - 2.2   Bilirubin Total 0.6 0.0 - 1.2 mg/dL   Alkaline Phosphatase 66 39 - 117 IU/L   LDH 182 119 - 226 IU/L   AST 29 0 - 40 IU/L   ALT 31 0 - 32 IU/L   GGT 18 0 - 60 IU/L   Iron 83 27 - 159 ug/dL   Cholesterol, Total 246 (H) 100 - 199 mg/dL   Triglycerides 216 (H) 0  - 149 mg/dL   HDL 46 >39 mg/dL    Comment: **Effective April 25, 2017, HDL Cholesterol**   reference interval will be changing to:  Female        Female                               88 - [phone]   42 - [phone]    VLDL Cholesterol Cal 43 (H) 5 - 40 mg/dL   LDL Calculated 157 (H) 0 - 99 mg/dL   Chol/HDL Ratio 5.3 (H) 0.0 - 4.4 ratio    Comment:                                   T. Chol/HDL Ratio                                             Men  Women                               1/2 Avg.Risk  3.4    3.3                                   Avg.Risk  5.0    4.4                                2X Avg.Risk  9.6    7.1                                3X Avg.Risk 23.4   11.0    Estimated CHD Risk 1.5 (H) 0.0 - 1.0 times avg.    Comment:                                   T. Chol/HDL Ratio                                             Men  Women                               1/2 Avg.Risk  3.4    3.3                                   Avg.Risk  5.0    4.4                                2X Avg.Risk  9.6    7.1                                3X Avg.Risk 23.4   11.0 The CHD Risk is based on the T. Chol/HDL ratio.  Other factors affect CHD Risk such as hypertension, smoking, diabetes, severe  obesity, and family history of pre- mature CHD.    TSH 1.850 0.450 - 4.500 uIU/mL   T4, Total 6.8 4.5 - 12.0 ug/dL   T3 Uptake Ratio 29 24 - 39 %   Free Thyroxine Index 2.0 1.2 - 4.9   WBC 3.8 3.4 - 10.8 x10E3/uL   RBC 4.74 3.77 - 5.28 x10E6/uL   Hemoglobin 14.8 11.1 - 15.9 g/dL   Hematocrit 43.3 34.0 - 46.6 %   MCV 91 79 - 97 fL   MCH 31.2 26.6 - 33.0 pg   MCHC 34.2 31.5 - 35.7 g/dL   RDW 12.9 12.3 - 15.4 %   Platelets 301 150 - 379 x10E3/uL   Neutrophils 44 Not Estab. %   Lymphs 41 Not Estab. %   Monocytes 10 Not Estab. %   Eos 4 Not Estab. %   Basos 1 Not Estab. %   Neutrophils Absolute 1.7 1.4 - 7.0 x10E3/uL   Lymphocytes Absolute 1.5 0.7 - 3.1 x10E3/uL   Monocytes  Absolute 0.4 0.1 - 0.9 x10E3/uL   EOS (ABSOLUTE) 0.1 0.0 - 0.4 x10E3/uL   Basophils Absolute 0.0 0.0 - 0.2 x10E3/uL   Immature Granulocytes 0 Not Estab. %   Immature Grans (Abs) 0.0 0.0 - 0.1 x10E3/uL  Hemoglobin A1c     Status: None   Collection Time: 04/11/17  8:50 AM  Result Value Ref Range   Hgb A1c MFr Bld 5.2 4.8 - 5.6 %    Comment:          Prediabetes: 5.7 - 6.4          Diabetes: >6.4          Glycemic control for adults with diabetes: <7.0   POCT Urinalysis Dipstick (CPT 81002)     Status: Abnormal   Collection Time: 05/10/17 11:50 AM  Result Value Ref Range   Color, UA yellow    Clarity, UA clear    Glucose, UA negative    Bilirubin, UA negative    Ketones, UA negative    Spec Grav, UA 1.020 1.010 - 1.025   Blood, UA large (A) negative   pH, UA 6.0 5.0 - 8.0   Protein Ur, POC negative negative mg/dL   Urobilinogen, UA 0.2 0.2 or 1.0 E.U./dL   Nitrite, UA Negative Negative   Leukocytes, UA Small (1+) (A) Negative  Urine Microscopic Only     Status: Abnormal   Collection Time: 06/20/17  9:57 AM  Result Value Ref Range   WBC, UA 3-6/hpf (A) 0-2/hpf   RBC / HPF 0-2/hpf 0-2/hpf   Squamous Epithelial / LPF Few(5-10/hpf) (A) Rare(0-4/hpf)   Bacteria, UA Few(10-50/hpf) (A) None   Yeast, UA Presence of (A) None  Urine Culture     Status: None   Collection Time: 06/20/17  9:57 AM  Result Value Ref Range   MICRO NUMBER: [id number]    SPECIMEN QUALITY: ADEQUATE    Sample Source NOT GIVEN    STATUS: FINAL    Result: No Growth   POCT Urinalysis Dipstick     Status: Abnormal   Collection Time: 06/20/17 10:10 AM  Result Value Ref Range   Color, UA yellow    Clarity, UA slightly cloudy    Glucose, UA neg    Bilirubin, UA neg    Ketones, UA neg    Spec Grav, UA 1.020 1.010 - 1.025   Blood, UA moderate    pH, UA 5.0 5.0 - 8.0   Protein, UA negative  Urobilinogen, UA 0.2 0.2 or 1.0 E.U./dL   Nitrite, UA negative    Leukocytes, UA Trace (A) Negative   Appearance      Odor    CMP     Status: None   Collection Time: 06/20/17 10:48 AM  Result Value Ref Range   Sodium 137 135 - 145 mEq/L   Potassium 4.2 3.5 - 5.1 mEq/L   Chloride 103 96 - 112 mEq/L   CO2 28 19 - 32 mEq/L   Glucose, Bld 89 70 - 99 mg/dL   BUN 13 6 - 23 mg/dL   Creatinine, Ser 0.77 0.40 - 1.20 mg/dL   Total Bilirubin 0.9 0.2 - 1.2 mg/dL   Alkaline Phosphatase 59 39 - 117 U/L   AST 21 0 - 37 U/L   ALT 19 0 - 35 U/L   Total Protein 8.1 6.0 - 8.3 g/dL   Albumin 4.6 3.5 - 5.2 g/dL   Calcium 9.7 8.4 - 10.5 mg/dL   GFR 83.93 >60.00 mL/min  Lipase     Status: None   Collection Time: 06/20/17 10:48 AM  Result Value Ref Range   Lipase 17.0 11.0 - 59.0 U/L   Objective  Body mass index is 25.07 kg/m. Wt Readings from Last 3 Encounters:  06/24/17 141 lb 8 oz (64.2 kg)  06/20/17 143 lb 8 oz (65.1 kg)  04/11/17 146 lb (66.2 kg)   Temp Readings from Last 3 Encounters:  06/24/17 98.1 F (36.7 C) (Oral)  06/20/17 97.8 F (36.6 C) (Oral)  05/10/17 98.5 F (36.9 C) (Tympanic)   BP Readings from Last 3 Encounters:  06/24/17 100/76  05/10/17 107/85  04/11/17 115/88   Pulse Readings from Last 3 Encounters:  06/24/17 84  06/20/17 68  05/10/17 74    Physical Exam  Constitutional: She is oriented to person, place, and time and well-developed, well-nourished, and in no distress. Vital signs are normal.  HENT:  Head: Normocephalic and atraumatic.  Mouth/Throat: Oropharynx is clear and moist and mucous membranes are normal.  Eyes: Conjunctivae are normal. Pupils are equal, round, and reactive to light.  Cardiovascular: Normal rate, regular rhythm and normal heart sounds.  Pulmonary/Chest: Effort normal and breath sounds normal.  Abdominal: Soft. Bowel sounds are normal. There is generalized tenderness and tenderness in the right lower quadrant. There is no rebound and no guarding.  Generalized mild ab pain mild to mod in RLQ  Neurological: She is alert and oriented to person, place,  and time. Gait normal. Gait normal.  Skin: Skin is warm, dry and intact.  Psychiatric: Mood, memory, affect and judgment normal.  Nursing note and vitals reviewed.   Assessment   1. Chronic ab pain with h/o IBS-D and H pylori and GERD  Plan  1.  Trial of dicyclomine qid and Carafate qid -she has never tried these medications  Disc immodium for diarrhea  Retest for H pylori  Cont PPI  Defer FMLA paperwork to [facility]  Referred back to [facility] for chronic ab pain  Given FODMAP diet info Reviewed colonoscopy and CT ab/pelvis 04/2017   Provider: Dr. Olivia Mackie McLean-Scocuzza-Internal Medicine

## 2017-06-24 NOTE — Patient Instructions (Addendum)
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 La LOPERAMIDA se utiliza para tratar [employer]. Este medicamento puede ser utilizado para otros usos; si tiene alguna pregunta consulte con su proveedor de atencin mdica o con su farmacutico. MARCAS COMUNES: Anti-Diarrheal, Imodium A-D, K-Pek II Qu le debo informar a mi profesional de la salud antes de tomar este medicamento? Necesita saber si usted presenta alguno de los siguientes problemas o situaciones: -heces de color oscuro o con sangre -intoxicacin alimentaria bacteriana -colitis o moco en sus heces -si est tomando antibiticos para una infeccin actualmente -fiebre -enfermedad heptica -dolor abdominal severo, hinchazn -una reaccin alrgica o inusual a la loperamida, a otros medicamentos, alimentos, colorantes o conservantes -si est embarazada o buscando quedar embarazada -si est amamantando a un beb Cmo debo utilizar este medicamento? Tome este medicamento por va oral con un vaso de agua. Siga las instrucciones de la etiqueta del [place]. Tome sus dosis a intervalos regulares. No tome su medicamento con una frecuencia mayor a la indicada. Hable con su pediatra para informarse acerca del uso de este medicamento en nios. Puede requerir atencin especial. Sobredosis: Pngase en contacto inmediatamente con un centro toxicolgico o una sala de urgencia si usted cree que haya tomado demasiado medicamento. ATENCIN: [employer] es solo para usted. No comparta este medicamento con nadie. Qu sucede si me olvido de una dosis? No se aplica en este caso. Esta medicina no es para uso regular. Tome esta  medicina solamente mientras que contina teniendo evacuacines intestinal flojas. No tome ms medicina que la recomendada por la etiqueta del medicamento o por su profesional de Technical sales engineer. Qu puede interactuar con este medicamento? No tome esta medicina con ninguno de los siguientes medicamentos: alosetrn Esta medicina tambin puede interactuar con los siguientes medicamentos: cimetidina claritromicina eritromicina gemfibrozil itraconazol ketoconazol quinidina quinina ranitidina ritonavir saquinavir Puede ser que esta lista no menciona todas las posibles interacciones. Informe a su profesional de KB Home	[place] de [employer] Corporation productos a base de hierbas, medicamentos de [place] o suplementos nutritivos que est tomando. Si usted fuma, consume bebidas alcohlicas o si utiliza drogas ilegales, indqueselo tambin a su profesional de KB Home	[place]. Algunas sustancias pueden interactuar con su medicamento. A qu debo estar atento al usar [employer]? No tome este medicamento durante ms de 2 das sin consultar a su mdico o profesional de Technical sales engineer. No use dosis superiores a las que le recet su mdico o a las Press photographer. Consulte a su mdico o profesional de la salud de inmediato si tiene fiebre, dolor abdominal intenso, inflamacin o hinchazn, o si tiene diarrea o heces con sangre/de color oscuro. Puede experimentar somnolencia o mareos. No conduzca, no utilice maquinaria ni haga nada que Associate Professor en estado de alerta hasta que sepa cmo le afecta este medicamento. No se siente ni se ponga de pie con rapidez, especialmente si es un paciente de edad avanzada. Esto reduce el riesgo de mareos o [employer]. El alcohol puede aumentar la posibilidad de somnolencia y [place]. Evite consumir bebidas alcohlicas. Se le podra secar la boca. Masticar chicle sin azcar, chupar caramelos duros y tomar agua en abundancia  lo ayudar a Estate manager/land agent. Si el problema no desaparece o es severo,  consulte a su mdico. Electronics engineer agua en abundancia tambin puede ayudar a prevenir la deshidratacin que se puede producir con Building services engineer. Los pacientes de edad avanzada puede tener una respuesta ms variable a los efectos de [place], y son ms susceptibles a los efectos de la deshidratacin. Qu efectos secundarios puedo tener al [employer] este medicamento? Efectos secundarios que debe informar a su mdico o a Barrister's clerk de la salud tan pronto como sea posible: Chief of Staff, como erupcin cutnea, comezn/picazn o urticarias, e hinchazn de la cara, los labios o la lengua distensin, sensacin de hinchazn en el abdomen visin borrosa prdida del apetito signos y sntomas de un cambio peligroso en el pulso o ritmo cardiaco, tales como dolor en el pecho; mareos; ritmo cardiaco rpido o irregular; palpitaciones; sensacin de desmayos o aturdimiento, cadas; problemas respiratorios dolor estomacal Efectos secundarios que generalmente no requieren atencin mdica (infrmelos a su mdico o a su profesional de la salud si persisten o si son molestos): estreimiento somnolencia o mareos boca seca nuseas, vmito Puede ser que esta lista no menciona todos los posibles efectos secundarios. Comunquese a su mdico por asesoramiento mdico [employer] Inc. Usted puede informar los efectos secundarios a la FDA por telfono al 1-800-FDA-1088. Dnde debo guardar mi medicina? Mantngala fuera del alcance de los nios. Gurdela a temperatura ambiente de entre 15 y 60 grados C (61 y 29 grados F). Mantenga el envase bien cerrado. Deseche todo el medicamento que no haya utilizado, despus de la fecha de vencimiento. ATENCIN: Este folleto es un resumen. Puede ser que no cubra toda la posible informacin. Si usted tiene preguntas acerca de esta medicina, consulte con su mdico, su farmacutico o su profesional de Technical sales engineer.  2018 Elsevier/Gold Standard (2016-07-22 00:00:00)  Dicyclomine  tablets or capsules Qu es este medicamento? La DICICLOMINA se [employer] tratamiento de problemas intestinales, entre los que se incluye el sndrome del intestino irritable. Este medicamento puede ser utilizado para otros usos; si tiene alguna pregunta consulte con su proveedor de atencin mdica o con su farmacutico. MARCAS COMUNES: Bentyl Qu le debo informar a mi profesional de la salud antes de tomar este medicamento? Necesita saber si usted presenta alguno de los [employer] o situaciones: -dificultad para Garment/textile technologist -problemas de esfago o acidez estomacal -glaucoma -enfermedad cardiaca o ataque cardiaco previo -miastenia gravis -problemas de prstata -obstruccin o infeccin estomacal -colitis ulcerosa -una reaccin alrgica o inusual a la diciclomina, a otros medicamentos, alimentos, colorantes o conservantes -si est embarazada o buscando quedar embarazada -si est amamantando a un beb Cmo debo utilizar este medicamento? Tome este medicamento por va oral con un vaso de agua. Siga las instrucciones de la etiqueta del [place]. Es mejor tomar este medicamento con el estmago vaco, entre 30 minutos y 1 hora antes de las comidas. Tome su medicamento a intervalos regulares. No tome su medicamento con una frecuencia mayor a la indicada. Hable con su pediatra para informarse acerca del uso de este medicamento en nios. Puede requerir atencin especial. Aunque este medicamento ha sido recetado a nios tan menores como de 6 meses de edad para condiciones selectivas, las precauciones se aplican. Los pacientes de ms de 65 aos de edad pueden presentar reacciones ms fuertes y Designer, industrial/product dosis [employer]. Sobredosis: Pngase en contacto inmediatamente con un centro toxicolgico o una sala de urgencia si usted cree que haya tomado demasiado medicamento. ATENCIN: [employer] es  solo para usted. No comparta este medicamento con nadie. Qu sucede si me olvido de una  dosis? Si olvida una dosis, tmela lo antes posible. Si es casi la hora de la prxima dosis, tome slo esa dosis. No tome dosis adicionales o dobles. Qu puede interactuar con este medicamento? -amantadina -anticidos -benztropina -digoxina -disopiramida -medicamentos para las Set designer, resfros y dificultades respiratorias -medicamentos para la enfermedad de Alzheimer -medicamentos para la ansiedad o problemas para conciliar el sueo -medicamentos para la diarrea -medicamentos para la depresin mental -medicamentos para problemas mentales y trastornos psicticos -analgsicos -metoclopramida -tegaserod Puede ser que esta lista no menciona todas las posibles interacciones. Informe a su profesional de KB Home	[place] de [employer] Corporation productos a base de hierbas, medicamentos de [place] o suplementos nutritivos que est tomando. Si usted fuma, consume bebidas alcohlicas o si utiliza drogas ilegales, indqueselo tambin a su profesional de KB Home	[place]. Algunas sustancias pueden interactuar con su medicamento. A qu debo estar atento al usar Coca[employer]a? Puede experimentar somnolencia, mareos o visin borrosa. No conduzca ni utilice maquinaria, ni haga nada que Associate Professor en estado de alerta hasta que sepa cmo le afecta este medicamento. Para reducir el riesgo de mareos o [place], no se siente ni se ponga de pie con rapidez, especialmente si es un paciente de edad avanzada. El alcohol puede hacerlo sentir ms somnoliento, evite consumir bebidas alcohlicas. Si sus ojos se vuelven ms sensibles a la luz por el uso de este medicamento, mantngase fuera de la luz fuerte y use anteojos para el sol. Evite temperaturas elevadas extremas (piletas de agua caliente, saunas). Este medicamento puede hacer que sude menos que lo normal. La temperatura de su cuerpo puede elevarse a niveles peligrosos, lo que puede causar un golpe de Freight forwarder. Los anticidos pueden impedir la accin de Coca-Cola. Si  desea tomar un anticido porque tiene Higher education careers adviser, asegrese de que hayan transcurrido por lo menos 1 a 2 horas antes o despus de tomar Coca[employer]Cola. Se le podr secar la boca. Masticar chicle sin azcar, chupar caramelos duros y beber agua en abundancia le ayudar a mantener la boca hmeda. Si el problema no desaparece o es severo, consulte con su mdico. Qu efectos secundarios puedo tener al [employer] este medicamento? Efectos secundarios que debe informar a su mdico o a Barrister's clerk de la salud tan pronto como sea posible: -agitacin, nerviosismo, confusin -dificultad para tragar -mareos, somnolencia -pulso cardiaco rpido o lento -alucinaciones -dolor o dificultad para orinar Efectos secundarios que, por lo general, no requieren atencin mdica (debe informarlos a su mdico o a su profesional de la salud si persisten o si son molestos): -estreimiento -dolor de cabeza -nuseas o vmito -dificultad sexual Puede ser que esta lista no menciona todos los posibles efectos secundarios. Comunquese a su mdico por asesoramiento mdico [employer]. Usted puede informar los efectos secundarios a la FDA por telfono al 1-800-FDA-1088. Dnde debo guardar mi medicina? Mantngala fuera del alcance de los nios. Gurdela a [employer], a menos de 30 grados C (30 grados F). Protjala de la luz. Deseche todo el medicamento que no haya utilizado, despus de la fecha de vencimiento. ATENCIN: Este folleto es un resumen. Puede ser que no cubra toda la posible informacin. Si usted tiene preguntas acerca de esta medicina, consulte con su mdico, su farmacutico o su profesional de Technical sales engineer.  2018 Elsevier/Gold Standard (2014-08-13 00:00:00)  Sndrome del intestino irritable en los adultos (Irritable Bowel Syndrome, Adult) El sndrome del intestino irritable (  SII) no es Research scientist (life sciences). Se trata de una serie de sntomas que afectan a los rganos que  intervienen en la digestin (tubo gastrointestinal o tubo digestivo). Para regular la [employer] en que funciona el tubo digestivo, el cuerpo enva seales entre los intestinos y Nurse, mental health. Si tiene SII, puede haber un problema con estas seales. Como consecuencia, el tubo digestivo no funciona con normalidad. Los intestinos pueden volverse ms sensibles y Firefighter de forma exagerada a determinadas cosas. Esto ocurre especialmente cuando se consumen determinados alimentos o cuando se est bajo estrs. Hay cuatro tipos de sndrome del intestino irritable que pueden determinarse en funcin de la consistencia de la materia fecal:  SII con diarrea.  SII con estreimiento.  SII mixto.  SII no tipificado. Es importante saber qu tipo de SII es el que se padece. Algunos tratamientos pueden ser ms tiles para ciertos tipos de SII. CAUSAS Se desconoce la causa exacta del SII. FACTORES DE RIESGO Puede tener un riesgo ms alto de tener SII si:  Es mujer.  Es menor de 23NTI.  Tiene antecedentes familiares de SII.  Tiene problemas mentales.  Tuvo una infeccin bacteriana en el tubo digestivo. [place] sntomas de SII pueden variar de Ardelia Mems persona a Theatre manager. El sntoma principal es el dolor o Health and safety inspector abdominal. Otros sntomas por lo general incluyen uno o ms de los siguientes:  Diarrea, estreimiento, o ambos.  Hinchazn abdominal o meteorismo.  Sensacin de saciedad o malestar despus de comer poco o una cantidad normal de comida.  Gases frecuentes.  Mucosidad en las heces.  Sensacin de tener ms heces por eliminar despus de defecar. Los sntomas suelen aparecer y Armed forces operational officer. Pueden estar asociados con estrs, enfermedades psiquitricas o con nada en absoluto. DIAGNSTICO No hay un estudio especfico para diagnosticar el SII. El mdico har el diagnstico en funcin de un examen fsico, la historia clnica y los sntomas. Es posible que deban realizarle otros estudios  para Paramedic otras enfermedades que pueden estar causando los sntomas. Estos pueden incluir lo siguiente:  Anlisis de sangre.  Radiografas.  Tomografa computarizada.  Endoscopia y colonoscopia. En este estudio se observa el tubo digestivo con un tubo largo, delgado y flexible. TRATAMIENTO No hay cura para el SII, pero el tratamiento puede ayudar a [employer] sntomas. A menudo, el tratamiento para el SII incluye lo siguiente:  Cambios en la dieta, por ejemplo: ? Consumir ms fibra. ? Evitar los alimentos que causan sntomas. ? Beber ms agua. ? Consumir porciones normales de comida de [employer].  Medicamentos. Estos pueden incluir lo siguiente: ? Suplementos de fibra si est estreido. ? Medicamentos para Therapist, sports (antidiarreicos). ? Medicamentos para ayudar a Chiropodist musculares del tubo digestivo (antiespasmdicos). ? Medicamentos que [employer], como antidepresivos o tranquilizantes.  Terapia. ? La psicoterapia puede ayudar con la ansiedad, la depresin u otros problemas de salud mental que pueden empeorar los sntomas de [place].  Reduccin del estrs. ? Controlar el estrs puede ayudar a Theatre manager los sntomas bajo control. Edgar los medicamentos solamente como se lo haya indicado el mdico.  Consuma una dieta saludable. ? Evite los alimentos y las bebidas con Holiday representative. ? Incorpore gradualmente en la dieta una mayor cantidad de cereales integrales, frutas y verduras. Esto puede ser especialmente til si tiene SII con estreimiento. ? Evite los alimentos y las bebidas que intensifican los sntomas. Estos pueden incluir productos lcteos y bebidas  con cafena o con gas. ? No coma comidas abundantes. ? Beba suficiente lquido para mantener la orina clara o de color amarillo plido.  Haga ejercicios regularmente. Pdale al mdico que le recomiende algunas actividades  adecuadas para usted.  Concurra a todas las visitas de control como se lo haya indicado el mdico. Esto es importante. SOLICITE ATENCIN MDICA SI:  Administrator, Civil Service.  Tiene dificultad o dolor al tragar.  Tiene diarrea que empeora. SOLICITE ATENCIN MDICA DE INMEDIATO SI:  Tiene dolor abdominal intenso y que empeora.  Tiene diarrea y alguno de los siguientes sntomas: ? Aparece una erupcin, rigidez en el cuello o dolor de cabeza intensos. ? Est irritable, somnoliento o le cuesta despertarse. ? Se siente dbil, mareado o tiene mucha sed.  Observa sangre de color rojo brillante en la materia fecal, o las heces son negras y de Programmer, systems.  Tiene hinchazn abdominal atpica que es dolorosa.  Vomita continuamente.  Vomita con sangre (hematemesis).  Tiene dolor abdominal y [place]. Esta informacin no tiene Marine scientist el consejo del mdico. Asegrese de hacerle al mdico cualquier pregunta que tenga. Document Released: 06/21/2005 Document Revised: 07/12/2014 Document Reviewed: 03/08/2014 Elsevier Interactive Patient Education  2018 [place] para el sndrome del intestino irritable (Diet for Irritable Bowel Syndrome) Cuando se tiene sndrome del intestino irritable (SII), los alimentos que se consumen y los hbitos en cuanto a la alimentacin son Theatre stage manager. El sndrome del intestino irritable puede causar diferentes sntomas, como dolor abdominal, estreimiento o diarrea. Elegir los alimentos adecuados puede ayudar a Public house manager las molestias causadas por el sndrome del intestino irritable. Trabaje con el mdico y el nutricionista para Solicitor plan de alimentacin que ayude a Chief Technology Officer los sntomas. [place]?  Lleve un registro de alimentos. Esto ayudar a identificar los alimentos que causan sntomas. Escriba los siguientes datos: ? Lo que comi y cundo. ? Los sntomas que tiene. ? Cundo se presentan  los sntomas en relacin con las comidas.  Evite los alimentos que le ocasionen sntomas. Hable con el nutricionista sobre otras formas de [employer] mismos nutrientes que contienen estos alimentos.  Consuma ms alimentos que [employer]. Tome un suplemento de fibra si se lo indic el nutricionista.  Coma lentamente, en un clima distendido.  Intente consumir 5 o 6comidas pequeas por da. No saltee comidas.  Beba suficiente lquido para Consulting civil engineer orina clara o de color amarillo plido.  Pregntele al mdico si debe tomar un probitico de venta libre durante las crisis para ayudar a Air traffic controller las bacterias intestinales que son beneficiosas.  Si tiene clicos intestinales o diarrea, intente consumir alimentos con bajo contenido de [place] y alto contenido de carbohidratos. Los carbohidratos son, por ejemplo, pastas, arroz, panes y cereales integrales, frutas y verduras.  Si los productos lcteos reagudizan los sntomas, intente consumir menos cantidad. Es posible que Health visitor siente mejor que otros productos lcteos porque contiene bacterias que ayudan a la digestin. QU ALIMENTOS NO SE RECOMIENDAN? Los siguientes son algunos alimentos y bebidas que pueden empeorar los sntomas:  Alimentos grasos, como papas fritas.  Productos lcteos, como queso o helado.  Chocolate.  Alcohol.  Productos que contengan cafena, [place].  Bebidas gaseosas, como refrescos. Los artculos mencionados arriba pueden no ser [employer] de las bebidas y los alimentos que se Higher education careers adviser. Comunquese con su nutricionista para obtener ms informacin. [place]? El mdico o el nutricionista pueden recomendarle  que consuma ms alimentos que [employer]. La fibra puede ayudar a Theatre stage manager estreimiento y otros sntomas del sndrome del intestino irritable. Poco a poco, incorpore a la dieta alimentos que [employer] Inc para que el organismo pueda  acostumbrarse a ellos. El exceso de [place] de una vez puede causar gases e hinchazn abdominal. Los siguientes son algunos alimentos que son buenas fuentes de fibra:  Archivist.  Duraznos.  Peras.  Frutos rojos.  Higos.  Brcoli (crudo).  Repollo.  Zanahorias.  Guisantes crudos.  Porotos colorados.  Porotos de [place].  Pan integral.  Cereal integral. PARA OBTENER MS INFORMACIN Fundacin Internacional para los Energy manager Funcionales [employer] for Functional Gastrointestinal Disorders): www.iffgd.[place] Diabetes y [place] y [facility]): [email or web address].co.za.aspx Esta informacin no tiene como fin reemplazar el consejo del mdico. Asegrese de hacerle al mdico cualquier pregunta que tenga. Document Released: 10/07/2008 Document Revised: 07/12/2014 Document Reviewed: 09/21/2013 Elsevier Interactive Patient Education  2018 [employer].

## 2017-06-28 LAB — H PYLORI, IGM, IGG, IGA AB: H. pylori, IgA Abs: <9 units (ref 0.0–8.9)

## 2017-06-30 ENCOUNTER — Telehealth: Payer: Self-pay

## 2017-06-30 NOTE — Telephone Encounter (Signed)
msg left for patient to reschedule

## 2017-07-11 ENCOUNTER — Encounter: Payer: Self-pay | Admitting: Family Medicine

## 2017-07-11 ENCOUNTER — Ambulatory Visit: Payer: No Typology Code available for payment source | Admitting: Family Medicine

## 2017-07-11 VITALS — BP 112/70 | HR 67 | Temp 97.8°F | Wt 140.8 lb

## 2017-07-11 DIAGNOSIS — R102 Pelvic and perineal pain: Secondary | ICD-10-CM

## 2017-07-11 DIAGNOSIS — G8929 Other chronic pain: Secondary | ICD-10-CM

## 2017-07-11 DIAGNOSIS — R109 Unspecified abdominal pain: Secondary | ICD-10-CM

## 2017-07-11 LAB — CBC WITH DIFFERENTIAL/PLATELET
BASOS ABS: 0 10*3/uL (ref 0.0–0.1)
BASOS PCT: 1.3 % (ref 0.0–3.0)
Eosinophils Absolute: 0.2 10*3/uL (ref 0.0–0.7)
Eosinophils Relative: 6.1 % — ABNORMAL HIGH (ref 0.0–5.0)
HEMATOCRIT: 44.3 % (ref 36.0–46.0)
Hemoglobin: 15.2 g/dL — ABNORMAL HIGH (ref 12.0–15.0)
LYMPHS PCT: 44.9 % (ref 12.0–46.0)
Lymphs Abs: 1.5 10*3/uL (ref 0.7–4.0)
MCHC: 34.3 g/dL (ref 30.0–36.0)
MCV: 90.2 fl (ref 78.0–100.0)
MONOS PCT: 6.6 % (ref 3.0–12.0)
Monocytes Absolute: 0.2 10*3/uL (ref 0.1–1.0)
NEUTROS ABS: 1.4 10*3/uL (ref 1.4–7.7)
Neutrophils Relative %: 41.1 % — ABNORMAL LOW (ref 43.0–77.0)
PLATELETS: 254 10*3/uL (ref 150.0–400.0)
RBC: 4.91 Mil/uL (ref 3.87–5.11)
RDW: 12.6 % (ref 11.5–15.5)
WBC: 3.4 10*3/uL — ABNORMAL LOW (ref 4.0–10.5)

## 2017-07-11 NOTE — Assessment & Plan Note (Signed)
Patient with a very long history of chronic abdominal pain.  Discomfort is generalized though does have local pain in her right lower quadrant and right pelvis.  Prior CT scan unremarkable for cause.  She states that symptoms are not new or worse than prior.  Actually symptoms are a little bit better since going on [place]yl.  Given right pelvic pain pelvic exam was completed with discomfort as outlined.  Given chronicity we will obtain pelvic ultrasound to evaluate for potential causes though significant causes would seem unlikely with unremarkable CT scan results.  Will check a CBC to ensure her white count is not significantly elevated.  She will see GI as planned.  She is given return precautions.

## 2017-07-11 NOTE — Progress Notes (Signed)
Isabella Rumps, MD Phone: [phone]  Isabella Robertson is a 52 y.o. female who presents today for follow-up.  Patient presents for follow-up of chronic abdominal pain.  She has been followed by GI and has been evaluated here multiple times recently.  She was placed on Bentyl and Carafate.  The Carafate has helped with her epigastric burning sensation.  She had an H. pylori blood test that was negative.  She does have a history of H. pylori.  She notes lower abdominal pain as well particularly in the right lower quadrant that has been present for many years intermittently.  If she moves too much physically it bothers her.  She has had episodes of fairly significant pain and rigors in the past.  Most recent episode of pain was earlier today though it was not as severe as previously.  She notes overall her pain is better than it was last month when she was started on Bentyl and Carafate.  She has a history of IBS D.  She has bowel movements that alternate between normal and diarrhea.  Several weeks ago she had a small amount of blood in her stool.  She has been taking Bentyl, Carafate, and Prilosec.  She has an appointment with GI later this month.  Workup included an unremarkable CT virtual colonoscopy.  She is status post hysterectomy though does still have her ovaries.  No recent pelvic exams.  Social History   Tobacco Use  Smoking Status Never Smoker  Smokeless Tobacco Never Used     ROS see history of present illness  Objective  Physical Exam Vitals:   07/11/17 0824  BP: 112/70  Pulse: 67  Temp: 97.8 F (36.6 C)  SpO2: 98%    BP Readings from Last 3 Encounters:  07/11/17 112/70  06/24/17 100/76  05/10/17 107/85   Wt Readings from Last 3 Encounters:  07/11/17 140 lb 12.8 oz (63.9 kg)  06/24/17 141 lb 8 oz (64.2 kg)  06/20/17 143 lb 8 oz (65.1 kg)    Physical Exam  Constitutional: No distress.  Cardiovascular: Normal rate, regular rhythm and normal heart sounds.    Pulmonary/Chest: Effort normal and breath sounds normal.  Abdominal: Soft. Bowel sounds are normal. She exhibits no distension. There is tenderness (Mild throughout, moderate right lower quadrant). There is no rebound and no guarding.  Genitourinary:  Genitourinary Comments: Normal labia, normal vaginal mucosa, cervix is absent, right lower quadrant and right pelvic discomfort on bimanual exam, no masses palpated, left pelvis with no tenderness, chaperone used  Musculoskeletal: She exhibits no edema.  Neurological: She is alert. Gait normal.  Skin: Skin is warm and dry. She is not diaphoretic.     Assessment/Plan: Please see individual problem list.  Chronic abdominal pain Patient with a very long history of chronic abdominal pain.  Discomfort is generalized though does have local pain in her right lower quadrant and right pelvis.  Prior CT scan unremarkable for cause.  She states that symptoms are not new or worse than prior.  Actually symptoms are a little bit better since going on Bentyl.  Given right pelvic pain pelvic exam was completed with discomfort as outlined.  Given chronicity we will obtain pelvic ultrasound to evaluate for potential causes though significant causes would seem unlikely with unremarkable CT scan results.  Will check a CBC to ensure her white count is not significantly elevated.  She will see GI as planned.  She is given return precautions.   Pierre was seen today  for follow-up.  Diagnoses and all orders for this visit:  Chronic abdominal pain -     CBC w/Diff  Chronic pelvic pain in female -     US Pelvis Complete; Future -     US Transvaginal Non-OB; Future    Orders Placed This Encounter  Procedures  . US Pelvis Complete    Standing Status:   Future    Standing Expiration Date:   09/09/2018    Order Specific Question:   Reason for Exam (SYMPTOM  OR DIAGNOSIS REQUIRED)    Answer:   right pelvic pain, chronic, no masses palpated    Order Specific  Question:   Preferred imaging location?    Answer:   [facility]  . US Transvaginal Non-OB    Standing Status:   Future    Standing Expiration Date:   09/09/2018    Order Specific Question:   Reason for Exam (SYMPTOM  OR DIAGNOSIS REQUIRED)    Answer:   right sided pelvic pain, chronic, no masses palpated    Order Specific Question:   Preferred imaging location?    Answer:   [facility]  . CBC w/Diff    No orders of the defined types were placed in this encounter.    Isabella Rumps, MD [place]

## 2017-07-11 NOTE — Patient Instructions (Signed)
Nice to see you. Please continue your Bentyl, Carafate, and Prilosec.  Please see GI as planned. Given your pelvic discomfort we can obtain an ultrasound to evaluate your ovaries better to ensure that they are not contributing. If you develop worsening symptoms or blood in your stool please be evaluated.

## 2017-07-13 ENCOUNTER — Telehealth: Payer: Self-pay

## 2017-07-13 NOTE — Telephone Encounter (Signed)
Called patient to notify that we received form for FML and these would have to be filled out by her [place] for pec to speak to patient

## 2017-07-14 NOTE — Telephone Encounter (Signed)
Noted will discard

## 2017-07-14 NOTE — Telephone Encounter (Signed)
Relation to pt: self Call back number: [phone]  Reason for call:  Patient returned call and stated her gastroenterologist has a copy of FMLA forms and states she will follow up with specialist. Patient confirmed she has a copy of forms and please discard forms mentioned below.

## 2017-07-15 ENCOUNTER — Telehealth: Payer: Self-pay | Admitting: *Deleted

## 2017-07-15 DIAGNOSIS — D72819 Decreased white blood cell count, unspecified: Secondary | ICD-10-CM

## 2017-07-15 NOTE — Telephone Encounter (Signed)
Future lab order placed.  

## 2017-07-18 ENCOUNTER — Ambulatory Visit
Admission: RE | Admit: 2017-07-18 | Discharge: 2017-07-18 | Disposition: A | Discharge status: Home / Self Care | Payer: No Typology Code available for payment source | Source: Ambulatory Visit | Attending: Family Medicine | Admitting: Family Medicine

## 2017-07-18 DIAGNOSIS — G8929 Other chronic pain: Secondary | ICD-10-CM | POA: Diagnosis present

## 2017-07-18 DIAGNOSIS — R102 Pelvic and perineal pain: Secondary | ICD-10-CM | POA: Insufficient documentation

## 2017-07-29 ENCOUNTER — Other Ambulatory Visit (INDEPENDENT_AMBULATORY_CARE_PROVIDER_SITE_OTHER): Payer: No Typology Code available for payment source

## 2017-07-29 DIAGNOSIS — D72819 Decreased white blood cell count, unspecified: Secondary | ICD-10-CM

## 2017-07-29 LAB — CBC WITH DIFFERENTIAL/PLATELET
BASOS ABS: 0 10*3/uL (ref 0.0–0.1)
Basophils Relative: 1.2 % (ref 0.0–3.0)
EOS PCT: 4.5 % (ref 0.0–5.0)
Eosinophils Absolute: 0.2 10*3/uL (ref 0.0–0.7)
HEMATOCRIT: 43.3 % (ref 36.0–46.0)
Hemoglobin: 15 g/dL (ref 12.0–15.0)
LYMPHS PCT: 50.4 % — AB (ref 12.0–46.0)
Lymphs Abs: 1.8 10*3/uL (ref 0.7–4.0)
MCHC: 34.7 g/dL (ref 30.0–36.0)
MCV: 89.2 fl (ref 78.0–100.0)
MONOS PCT: 8.6 % (ref 3.0–12.0)
Monocytes Absolute: 0.3 10*3/uL (ref 0.1–1.0)
Neutro Abs: 1.2 10*3/uL — ABNORMAL LOW (ref 1.4–7.7)
Neutrophils Relative %: 35.3 % — ABNORMAL LOW (ref 43.0–77.0)
Platelets: 250 10*3/uL (ref 150.0–400.0)
RBC: 4.85 Mil/uL (ref 3.87–5.11)
RDW: 12.4 % (ref 11.5–15.5)
WBC: 3.5 10*3/uL — ABNORMAL LOW (ref 4.0–10.5)

## 2017-08-05 ENCOUNTER — Other Ambulatory Visit: Payer: Self-pay | Admitting: Family Medicine

## 2017-08-05 DIAGNOSIS — D72819 Decreased white blood cell count, unspecified: Secondary | ICD-10-CM

## 2017-08-12 DIAGNOSIS — D72819 Decreased white blood cell count, unspecified: Secondary | ICD-10-CM | POA: Insufficient documentation

## 2017-08-12 NOTE — Progress Notes (Signed)
[place]  [phone] [phone]) [phone]  ID: Isabella Robertson OB: [birth year]  MR#: [id number]  KKX#:[id number]  Patient Care Team: [name], MD as PCP - General (Family Medicine) Christene Lye, MD (General Surgery) Denton Lank, MD as Referring Physician (Family Medicine)  CHIEF COMPLAINT: Leukopenia.  INTERVAL HISTORY: Patient is a 52 year old female who was noted to have a persistently decreased white blood cell count on routine blood work.  She is anxious, but otherwise feels well.  She denies any recent fevers or infections.  She does not have any persistent illnesses.  She has a good appetite and denies weight loss.  She has no neurologic complaints.  She denies any chest pain, shortness of breath, or cough.  She denies any nausea, vomiting, constipation, or diarrhea.  She has no urinary complaints.  Patient feels at her baseline offers no specific complaints today.  REVIEW OF SYSTEMS:   Review of Systems  Constitutional: Negative.  Negative for fever, malaise/fatigue and weight loss.  Respiratory: Negative.  Negative for cough and shortness of breath.   Cardiovascular: Negative.  Negative for chest pain and leg swelling.  Gastrointestinal: Negative.  Negative for abdominal pain.  Genitourinary: Negative.  Negative for dysuria.  Musculoskeletal: Negative.   Skin: Negative.  Negative for rash.  Neurological: Negative.  Negative for sensory change and weakness.  Psychiatric/Behavioral: The patient is nervous/anxious.     As per HPI. Otherwise, a complete review of systems is negative.  PAST MEDICAL HISTORY: Past Medical History:  Diagnosis Date  . Chronic abdominal pain   . Chronic abdominal pain   . Chronic constipation   . Diffuse cystic mastopathy   . Diverticulitis   . Endometriosis    History of; Now s/p hysterectomy.  . Genetic screening 11/28/2012   negative /[facility]  . Helicobacter pylori (H. pylori)  infection    x 3 per as of 06/2017   . IBS (irritable bowel syndrome)    diarrhea type per pt as of 06/2017     PAST SURGICAL HISTORY: Past Surgical History:  Procedure Laterality Date  . ABDOMINAL HYSTERECTOMY  2007  . CESAREAN SECTION  2003  . COLONOSCOPY  2010  . COLONOSCOPY WITH PROPOFOL N/A 04/08/2017   Procedure: COLONOSCOPY WITH PROPOFOL;  Surgeon: Lollie Sails, MD;  Location: [facility] ENDOSCOPY;  Service: Endoscopy;  Laterality: N/A;  . ECTOPIC PREGNANCY SURGERY  [id number]  . EYE SURGERY  2003  . HERNIA REPAIR  age 53    FAMILY HISTORY: Family History  Problem Relation Age of Onset  . Breast cancer Paternal Aunt   . Breast cancer Mother 60  . Breast cancer Cousin        breast - paternal side    ADVANCED DIRECTIVES (Y/N):  N  HEALTH MAINTENANCE: Social History   Tobacco Use  . Smoking status: Never Smoker  . Smokeless tobacco: Never Used  Substance Use Topics  . Alcohol use: No    Alcohol/week: 0.0 oz  . Drug use: No     Colonoscopy:  PAP:  Bone density:  Lipid panel:  Allergies  Allergen Reactions  . Shrimp [Shellfish Allergy]     Current Outpatient Medications  Medication Sig Dispense Refill  . albuterol (PROVENTIL HFA;VENTOLIN HFA) 108 (90 Base) MCG/ACT inhaler Inhale 1-2 puffs into the lungs every 4 (four) hours as needed for wheezing or shortness of breath. 1 Inhaler 0  . cetirizine (ZYRTEC) 10 MG tablet Take 10 mg daily as needed by mouth.    Marland Kitchen  dicyclomine (BENTYL) 20 MG tablet Take 1 tablet (20 mg total) by mouth 4 (four) times daily -  before meals and at bedtime. 120 tablet 0  . omeprazole (PRILOSEC) 20 MG capsule Take 1 capsule (20 mg total) by mouth daily. 30 capsule 0  . sucralfate (CARAFATE) 1 g tablet Take 1 tablet (1 g total) by mouth 4 (four) times daily -  with meals and at bedtime. 120 tablet 0  . fluticasone (FLONASE) 50 MCG/ACT nasal spray Place 1 spray 2 (two) times daily as needed into both nostrils.     No current  facility-administered medications for this visit.     OBJECTIVE: Vitals:   08/16/17 1524  BP: 114/80  Pulse: 77  Resp: 20  Temp: (!) 97.2 F (36.2 C)     Body mass index is 25.3 kg/m.    ECOG FS:0 - Asymptomatic  General: Well-developed, well-nourished, no acute distress. Eyes: Pink conjunctiva, anicteric sclera. HEENT: Normocephalic, moist mucous membranes, clear oropharnyx. Lungs: Clear to auscultation bilaterally. Heart: Regular rate and rhythm. No rubs, murmurs, or gallops. Abdomen: Soft, nontender, nondistended. No organomegaly noted, normoactive bowel sounds. Musculoskeletal: No edema, cyanosis, or clubbing. Neuro: Alert, answering all questions appropriately. Cranial nerves grossly intact. Skin: No rashes or petechiae noted. Psych: Normal affect. Lymphatics: No cervical, calvicular, axillary or inguinal LAD.   LAB RESULTS:  Lab Results  Component Value Date   NA 137 06/20/2017   K 4.2 06/20/2017   CL 103 06/20/2017   CO2 28 06/20/2017   GLUCOSE 89 06/20/2017   BUN 13 06/20/2017   CREATININE 0.77 06/20/2017   CALCIUM 9.7 06/20/2017   PROT 8.1 06/20/2017   ALBUMIN 4.6 06/20/2017   AST 21 06/20/2017   ALT 19 06/20/2017   ALKPHOS 59 06/20/2017   BILITOT 0.9 06/20/2017   GFRNONAA 75 04/11/2017   GFRAA 87 04/11/2017    Lab Results  Component Value Date   WBC 4.2 08/16/2017   NEUTROABS 1.8 08/16/2017   HGB 14.4 08/16/2017   HCT 41.5 08/16/2017   MCV 88.9 08/16/2017   PLT 288 08/16/2017     STUDIES: No results found.  ASSESSMENT: Leukopenia   PLAN:   1. Leukopenia: Patient's white blood cell count is currently within normal limits.  Unclear etiology of her transient drop.  All of her other laboratory work from today including iron stores, B12, folate, SPEP, and peripheral blood flow cytometry are either negative or within normal limits.  Antineutrophil antibodies are pending at time of dictation.  No intervention is needed at this time.  Patient does  not require bone marrow biopsy.  Return to clinic in 3 months with repeat laboratory work and further evaluation.  If her white blood cell count continues to be within normal limits, she likely can be discharged from clinic.  Approximately 45 minutes was spent in discussion of which greater than 50% was consultation.  Patient expressed understanding and was in agreement with this plan. She also understands that She can call clinic at any time with any questions, concerns, or complaints.    Lloyd Huger, MD   08/19/2017 1:54 PM

## 2017-08-16 ENCOUNTER — Inpatient Hospital Stay: Payer: No Typology Code available for payment source | Attending: Oncology | Admitting: Oncology

## 2017-08-16 ENCOUNTER — Encounter: Payer: Self-pay | Admitting: Oncology

## 2017-08-16 ENCOUNTER — Inpatient Hospital Stay: Payer: No Typology Code available for payment source

## 2017-08-16 DIAGNOSIS — Z79899 Other long term (current) drug therapy: Secondary | ICD-10-CM

## 2017-08-16 DIAGNOSIS — Z803 Family history of malignant neoplasm of breast: Secondary | ICD-10-CM | POA: Diagnosis not present

## 2017-08-16 DIAGNOSIS — F419 Anxiety disorder, unspecified: Secondary | ICD-10-CM

## 2017-08-16 DIAGNOSIS — D72819 Decreased white blood cell count, unspecified: Secondary | ICD-10-CM

## 2017-08-16 LAB — CBC WITH DIFFERENTIAL/PLATELET
BASOS ABS: 0.1 10*3/uL (ref 0–0.1)
Basophils Relative: 2 %
EOS ABS: 0.2 10*3/uL (ref 0–0.7)
EOS PCT: 6 %
HCT: 41.5 % (ref 35.0–47.0)
Hemoglobin: 14.4 g/dL (ref 12.0–16.0)
Lymphocytes Relative: 43 %
Lymphs Abs: 1.8 10*3/uL (ref 1.0–3.6)
MCH: 30.8 pg (ref 26.0–34.0)
MCHC: 34.6 g/dL (ref 32.0–36.0)
MCV: 88.9 fL (ref 80.0–100.0)
MONO ABS: 0.3 10*3/uL (ref 0.2–0.9)
Monocytes Relative: 7 %
Neutro Abs: 1.8 10*3/uL (ref 1.4–6.5)
Neutrophils Relative %: 42 %
PLATELETS: 288 10*3/uL (ref 150–440)
RBC: 4.67 MIL/uL (ref 3.80–5.20)
RDW: 12.6 % (ref 11.5–14.5)
WBC: 4.2 10*3/uL (ref 3.6–11.0)

## 2017-08-16 LAB — IRON AND TIBC
IRON: 56 ug/dL (ref 28–170)
SATURATION RATIOS: 16 % (ref 10.4–31.8)
TIBC: 344 ug/dL (ref 250–450)
UIBC: 288 ug/dL

## 2017-08-16 LAB — FOLATE: Folate: 13.8 ng/mL (ref >5.9)

## 2017-08-16 LAB — FERRITIN: Ferritin: 104 ng/mL (ref 11–307)

## 2017-08-16 LAB — VITAMIN B12: Vitamin B-12: 428 pg/mL (ref 180–914)

## 2017-08-16 NOTE — Progress Notes (Signed)
Patient here today for initial evaluation regarding leukopenia.

## 2017-08-17 LAB — PROTEIN ELECTROPHORESIS, SERUM
A/G RATIO SPE: 1.1 (ref 0.7–1.7)
ALPHA-1-GLOBULIN: 0.2 g/dL (ref 0.0–0.4)
ALPHA-2-GLOBULIN: 0.6 g/dL (ref 0.4–1.0)
Albumin ELP: 4.1 g/dL (ref 2.9–4.4)
Beta Globulin: 1.3 g/dL (ref 0.7–1.3)
GLOBULIN, TOTAL: 3.8 g/dL (ref 2.2–3.9)
Gamma Globulin: 1.7 g/dL (ref 0.4–1.8)
Total Protein ELP: 7.9 g/dL (ref 6.0–8.5)

## 2017-08-18 ENCOUNTER — Encounter: Payer: Self-pay | Admitting: *Deleted

## 2017-08-18 LAB — COMP PANEL: LEUKEMIA/LYMPHOMA

## 2017-08-23 LAB — NEUTROPHIL AB TEST LEVEL 1: NEUTROPHIL SCR/PANEL INTERP.: NEGATIVE

## 2017-10-10 ENCOUNTER — Ambulatory Visit: Payer: No Typology Code available for payment source | Admitting: Family Medicine

## 2017-10-10 ENCOUNTER — Other Ambulatory Visit: Payer: Self-pay

## 2017-10-10 ENCOUNTER — Encounter: Payer: Self-pay | Admitting: Family Medicine

## 2017-10-10 DIAGNOSIS — G8929 Other chronic pain: Secondary | ICD-10-CM

## 2017-10-10 DIAGNOSIS — R109 Unspecified abdominal pain: Secondary | ICD-10-CM | POA: Diagnosis not present

## 2017-10-10 DIAGNOSIS — E785 Hyperlipidemia, unspecified: Secondary | ICD-10-CM | POA: Insufficient documentation

## 2017-10-10 DIAGNOSIS — K219 Gastro-esophageal reflux disease without esophagitis: Secondary | ICD-10-CM

## 2017-10-10 DIAGNOSIS — J309 Allergic rhinitis, unspecified: Secondary | ICD-10-CM | POA: Insufficient documentation

## 2017-10-10 NOTE — Progress Notes (Signed)
  Tommi Rumps, MD Phone: [phone]  Isabella Robertson is a 52 y.o. female who presents today for f/u.  Allergic rhinitis: Taking Zyrtec at night.  Occasional Flonase use.  Notes sneezing and runny nose and itching.  Well-controlled at this time.  She saw GI and general surgery for abdominal pain.  Dr. Terese Door appears to have seen her as well and started her on Bentyl and Carafate.  She notes her pain has improved at this point.  Still has occasional discomfort.  Was having diarrhea previously though that has improved as well.  She had a virtual colonoscopy as well as a regular colonoscopy.  No fixed defects.  She had blood at some point in the past with her diarrhea though none recently.  GERD: Symptoms are well controlled with her omeprazole.  Stomach pain is improved.  Hyperlipidemia: ASCVD risk score is 1.6%.  She has worked on her diet with cutting out fried foods as well as red meat.  Also cut down on sugar.  No exercise.  No chest pain.  Social History   Tobacco Use  Smoking Status Never Smoker  Smokeless Tobacco Never Used     ROS see history of present illness  Objective  Physical Exam Vitals:   10/10/17 0821  BP: 108/80  Pulse: 68  Temp: 98.4 F (36.9 C)  SpO2: 97%    BP Readings from Last 3 Encounters:  10/10/17 108/80  08/16/17 114/80  07/11/17 112/70   Wt Readings from Last 3 Encounters:  10/10/17 145 lb (65.8 kg)  08/16/17 142 lb 12.8 oz (64.8 kg)  07/11/17 140 lb 12.8 oz (63.9 kg)    Physical Exam  Constitutional: No distress.  HENT:  Mouth/Throat: Oropharynx is clear and moist. No oropharyngeal exudate.  Cardiovascular: Normal rate, regular rhythm and normal heart sounds.  Pulmonary/Chest: Effort normal and breath sounds normal.  Abdominal: Soft. Bowel sounds are normal. She exhibits no distension. There is no rebound and no guarding.  Slight soreness on palpation right lower abdomen  Musculoskeletal: She exhibits no edema.    Neurological: She is alert. Gait normal.  Skin: Skin is warm and dry. She is not diaphoretic.     Assessment/Plan: Please see individual problem list.  GERD (gastroesophageal reflux disease) Well-controlled on omeprazole.  Chronic abdominal pain Symptoms have improved with Bentyl and Carafate.  Continue these medications.  She will continue to see GI.  If needed she can follow back up with general surgery.  If symptoms worsen or change she will be reevaluated.  Allergic rhinitis Well-controlled.  Continue Zyrtec and as needed Flonase.  HLD (hyperlipidemia) She will work on diet and exercise.  Plan to recheck lipid panel in 6 months.  No orders of the defined types were placed in this encounter.   No orders of the defined types were placed in this encounter.   Tommi Rumps, MD [place]

## 2017-10-10 NOTE — Assessment & Plan Note (Signed)
Symptoms have improved with Bentyl and Carafate.  Continue these medications.  She will continue to see GI.  If needed she can follow back up with general surgery.  If symptoms worsen or change she will be reevaluated.

## 2017-10-10 NOTE — Patient Instructions (Signed)
Nice to see you. Please continue your medications for IBS.  If you have worsening symptoms or new symptoms please be evaluated. Please work on diet and exercise.

## 2017-10-10 NOTE — Assessment & Plan Note (Signed)
She will work on diet and exercise.  Plan to recheck lipid panel in 6 months.

## 2017-10-10 NOTE — Assessment & Plan Note (Signed)
Well controlled on omeprazole 

## 2017-10-10 NOTE — Assessment & Plan Note (Signed)
Well-controlled.  Continue Zyrtec and as needed Flonase.

## 2017-10-25 ENCOUNTER — Telehealth: Payer: Self-pay | Admitting: *Deleted

## 2017-10-25 MED ORDER — ALBUTEROL SULFATE HFA 108 (90 BASE) MCG/ACT IN AERS: 1.0000 | INHALATION_SPRAY | RESPIRATORY_TRACT | 0 refills | Status: DC | PRN

## 2017-10-25 MED ORDER — ALBUTEROL SULFATE HFA 108 (90 BASE) MCG/ACT IN AERS
1.0000 | INHALATION_SPRAY | RESPIRATORY_TRACT | 0 refills | Status: DC | PRN
Start: 2017-10-25 — End: 2017-10-25

## 2017-10-25 NOTE — Telephone Encounter (Signed)
Saw PCM 9 Apr 19 ensure she is taking zyrtec, flonase and have her start nasal saline also and ensure showering prior to bedtime.  Will consider singulair 10mg  po daily if no relief with nasal steroid and antihistamine.  Please schedule patient for appt if she continues with SOB  Please give her bottle of nasal saline from clinic stock and instruct her to use prn prior to flonase.

## 2017-10-25 NOTE — Telephone Encounter (Signed)
Pt reports dry, non-productive cough x1 week. Began feeling dizzy after having coughing spell for past 10 min and presented to clinic 2/2 dizziness. O2 sat 98%, p82. Feels better and dizziness mostly resolves after sitting and coughing has stopped. Denies sinus pain/pressure, otalgia, nasal congestion. +sore throat, losing voice intermittently.  No OTC meds at home. Has been using Albuterol inh from previous clinic encounter last year at home that has worked well for her for the past few days. Last used yesterday afternoon. Had been keeping it in her purse but out it on nightstand last night in case she needed it during the night and accidentally left it at home.  Refill order placed for new inhaler since current one is almost one year old. Instructed she may continue using her current one until the expiration date or it is empty. She verbalizes understanding and agreement. Pharmacy of choice confirmed. No further needs/concerns.

## 2017-10-25 NOTE — Telephone Encounter (Signed)
Albuterol order was denied by insurance per fax sent from [facility]. PA requested by pharmacy. [employer] online formulary checked. It reports to call Rx Benefits regarding Albuterol but offers Ventolin inh as a suggested alternative med with $2 copay. Order revised to brand inhaler.

## 2017-10-25 NOTE — Telephone Encounter (Signed)
Noted and signed.

## 2017-11-17 ENCOUNTER — Ambulatory Visit: Payer: No Typology Code available for payment source | Admitting: Oncology

## 2017-11-25 ENCOUNTER — Telehealth: Payer: Self-pay | Admitting: *Deleted

## 2017-11-25 MED ORDER — FLUTICASONE PROPIONATE 50 MCG/ACT NA SUSP: 1.0000 | Freq: Two times a day (BID) | NASAL | 6 refills | Status: DC

## 2017-11-25 NOTE — Telephone Encounter (Signed)
Pt presents c/o sore throat, productive cough, chest congestion, maxillary sinus pressure, nasal congestion, bilateral otalgia x3 days. Low grade fever yesterday. Afebrile today. Body aches yesterday. Bilateral TMs cloudy, air fluid level, no erythema. Using Dayquil/Nyquil Cold & Sinus with short term relief. Also using nasal saline spray. Not using Flonase. Does not have any more at home. Flonase refill placed. Continue otc decongestant/cough suppressant/expectorant, nasal saline. NP appt next week if sx not improving.

## 2018-03-17 LAB — HM MAMMOGRAPHY

## 2018-04-27 ENCOUNTER — Encounter: Payer: Self-pay | Admitting: Registered Nurse

## 2018-04-27 ENCOUNTER — Ambulatory Visit: Payer: Self-pay | Admitting: Registered Nurse

## 2018-04-27 VITALS — BP 92/78 | HR 76 | Temp 98.6°F

## 2018-04-27 DIAGNOSIS — M62838 Other muscle spasm: Secondary | ICD-10-CM

## 2018-04-27 DIAGNOSIS — M25531 Pain in right wrist: Secondary | ICD-10-CM

## 2018-04-27 DIAGNOSIS — M79644 Pain in right finger(s): Secondary | ICD-10-CM

## 2018-04-27 MED ORDER — IBUPROFEN 800 MG PO TABS: 800.0000 mg | ORAL_TABLET | Freq: Three times a day (TID) | ORAL | 0 refills | Status: DC

## 2018-04-27 NOTE — Patient Instructions (Signed)
Muscle Cramps and Spasms Muscle cramps and spasms are when muscles tighten by themselves. They usually get better within minutes. Muscle cramps are painful. They are usually stronger and last longer than muscle spasms. Muscle spasms may or may not be painful. They can last a few seconds or much longer. Follow these instructions at home:  Drink enough fluid to keep your pee (urine) clear or pale yellow.  Massage, stretch, and relax the muscle.  If directed, apply heat to tight or tense muscles as often as told by your doctor. Use the heat source that your doctor recommends. ? Place a towel between your skin and the heat source. ? Leave the heat on for 20-30 minutes. ? Take off the heat if your skin turns bright red. This is especially important if you are unable to feel pain, heat, or cold. You may have a greater risk of getting burned.  If directed, put ice on the affected area. This may help if you are sore or have pain after a cramp or spasm. ? Put ice in a plastic bag. ? Place a towel between your skin and the bag. ? Leave the ice on for 20 minutes, 2-3 times a day.  Take over-the-counter and prescription medicines only as told by your doctor.  Pay attention to any changes in your symptoms. Contact a doctor if:  Your cramps or spasms get worse or happen more often.  Your cramps or spasms do not get better with time. This information is not intended to replace advice given to you by your health care provider. Make sure you discuss any questions you have with your health care provider. Document Released: 06/03/2008 Document Revised: 07/23/2015 Document Reviewed: 03/25/2015 Elsevier Interactive Patient Education  2018 [place]. Cervical Strain and Sprain Rehab Ask your health care provider which exercises are safe for you. Do exercises exactly as told by your health care provider and adjust them as directed. It is normal to feel mild stretching, pulling, tightness, or discomfort as  you do these exercises, but you should stop right away if you feel sudden pain or your pain gets worse.Do not begin these exercises until told by your health care provider. Stretching and range of motion exercises These exercises warm up your muscles and joints and improve the movement and flexibility of your neck. These exercises also help to relieve pain, numbness, and tingling. Exercise A: Cervical side bend  1. Using good posture, sit on a stable chair or stand up. 2. Without moving your shoulders, slowly tilt your left / right ear to your shoulder until you feel a stretch in your neck muscles. You should be looking straight ahead. 3. Hold for _____15____ seconds. 4. Repeat with the other side of your neck. Repeat _____3_____ times. Complete this exercise _3________ times a day. Exercise B: Cervical rotation  1. Using good posture, sit on a stable chair or stand up. 2. Slowly turn your head to the side as if you are looking over your left / right shoulder. ? Keep your eyes level with the ground. ? Stop when you feel a stretch along the side and the back of your neck. 3. Hold for __15________ seconds. 4. Repeat this by turning to your other side. Repeat ___3_______ times. Complete this exercise ____3______ times a day. Exercise C: Thoracic extension and pectoral stretch 1. Roll a towel or a small blanket so it is about 4 inches (10 cm) in diameter. 2. Lie down on your back on a firm surface. 3.  Put the towel lengthwise, under your spine in the middle of your back. It should not be not under your shoulder blades. The towel should line up with your spine from your middle back to your lower back. 4. Put your hands behind your head and let your elbows fall out to your sides. 5. Hold for ____15______ seconds. Repeat _____3_____ times. Complete this exercise ______3____ times a day. Strengthening exercises These exercises build strength and endurance in your neck. Endurance is the ability to  use your muscles for a long time, even after your muscles get tired. Exercise D: Upper cervical flexion, isometric 1. Lie on your back with a thin pillow behind your head and a small rolled-up towel under your neck. 2. Gently tuck your chin toward your chest and nod your head down to look toward your feet. Do not lift your head off the pillow. 3. Hold for ____15______ seconds. 4. Release the tension slowly. Relax your neck muscles completely before you repeat this exercise. Repeat ___3______ times. Complete this exercise ___3_______ times a day. Exercise E: Cervical extension, isometric  1. Stand about 6 inches (15 cm) away from a wall, with your back facing the wall. 2. Place a soft object, about 6-8 inches (15-20 cm) in diameter, between the back of your head and the wall. A soft object could be a small pillow, a ball, or a folded towel. 3. Gently tilt your head back and press into the soft object. Keep your jaw and forehead relaxed. 4. Hold for ___15_______ seconds. 5. Release the tension slowly. Relax your neck muscles completely before you repeat this exercise. Repeat ______3___ times. Complete this exercise __3________ times a day. Posture and body mechanics  Body mechanics refers to the movements and positions of your body while you do your daily activities. Posture is part of body mechanics. Good posture and healthy body mechanics can help to relieve stress in your body's tissues and joints. Good posture means that your spine is in its natural S-curve position (your spine is neutral), your shoulders are pulled back slightly, and your head is not tipped forward. The following are general guidelines for applying improved posture and body mechanics to your everyday activities. Standing  When standing, keep your spine neutral and keep your feet about hip-width apart. Keep a slight bend in your knees. Your ears, shoulders, and hips should line up.  When you do a task in which you stand in  one place for a long time, place one foot up on a stable object that is 2-4 inches (5-10 cm) high, such as a footstool. This helps keep your spine neutral. Sitting   When sitting, keep your spine neutral and your keep feet flat on the floor. Use a footrest, if necessary, and keep your thighs parallel to the floor. Avoid rounding your shoulders, and avoid tilting your head forward.  When working at a desk or a computer, keep your desk at a height where your hands are slightly lower than your elbows. Slide your chair under your desk so you are close enough to maintain good posture.  When working at a computer, place your monitor at a height where you are looking straight ahead and you do not have to tilt your head forward or downward to look at the screen. Resting When lying down and resting, avoid positions that are most painful for you. Try to support your neck in a neutral position. You can use a contour pillow or a small rolled-up towel. Your pillow  should support your neck but not push on it. This information is not intended to replace advice given to you by your health care provider. Make sure you discuss any questions you have with your health care provider. Document Released: 06/21/2005 Document Revised: 02/26/2016 Document Reviewed: 05/28/2015 Elsevier Interactive Patient Education  2018 [place] Sprain A finger sprain is an injury to one of the strong bands of tissue (ligaments) that connect the bones in the finger. The ligament can be stretched too much, or it can tear. A tear can be either partial or complete. The severity of the sprain depends on how much of the ligament was damaged or torn. CAUSES This injury is often caused by a fall or an accident. For example, if you extend your hands to catch an object or to protect yourself during a fall, the force of impact may cause the ligaments in your finger to stretch too much. RISK FACTORS The following factors may make you  more likely to have this injury:  Playing sports that involve a greater risk of falling, such as skiing.  Playing sports that involve catching an object, such as basketball.  Having poor strength and flexibility. SYMPTOMS Symptoms of this condition include:  Pain at the affected finger joint, especially when bending or extending the finger.  Loss of motion in the finger.  Swelling.  Tenderness.  Bruising. DIAGNOSIS This condition is diagnosed with a medical history and physical exam. You may also have an X-ray of your finger to rule out a fracture or dislocation. TREATMENT Treatment varies depending on the severity of the sprain. If your ligament is overstretched or partially torn, treatment usually involves:  Keeping the finger in a fixed position (immobilization) for a period of time. To help you do this, your health care provider may apply a bandage, splint, or cast to keep the finger from moving until it heals. In some cases, the finger may be taped to the fingers beside it (buddy taping).  Taking medicines for pain.  Doing exercises for the finger after it has begun to heal. If your ligament is fully torn, you may need surgery to reconnect the ligament to the bone. After surgery, a cast or splint will be applied. HOME CARE INSTRUCTIONS If You Have a Splint:  Wear the splint as told by your health care provider. Remove it only as told by your health care provider.  Loosen the splint if your fingers tingle, become numb, or turn cold and blue.  Do not let your splint get wet if it is not waterproof.  Keep the splint clean. If You Have a Cast:  Do not stick anything inside the cast to scratch your skin. Doing that increases your risk of infection.  Check the skin around the cast every day. Tell your health care provider about any concerns.  You may put lotion on dry skin around the edges of the cast. Do not put lotion on the skin underneath the cast.  Do not let your  cast get wet if it is not waterproof.  Keep the cast clean. Bathing  If your splint or cast is not waterproof, cover it with a watertight plastic bag when you take a bath or a shower.  Keep any bandages (dressings) dry until your health care provider says they can be removed. Managing Pain, Stiffness, and Swelling  If directed, put ice on the injured area: ? Put ice in a plastic bag. ? Place a towel between your skin and the  bag. ? Leave the ice on for 20 minutes, 2-3 times a day.  Move your fingers often to avoid stiffness and to lessen swelling.  Raise (elevate) the injured area above the level of your heart while you are sitting or lying down. General Instructions  Do not put pressure on any part of the cast or splint until it is fully hardened. This may take several hours.  Take over-the-counter and prescription medicines only as told by your health care provider.  Do not drive or operate heavy machinery while taking prescription pain medicine.  Do exercises as told by your health care provider or physical therapist.  Do not wear rings on your injured finger.  Keep all follow-up visits as told by your health care provider. This is important. SEEK MEDICAL CARE IF:  Your pain is not controlled with medicine.  Your bruising or swelling gets worse.  Your cast or splint is damaged.  Your finger is numb or blue.  Your finger feels colder than normal. This information is not intended to replace advice given to you by your health care provider. Make sure you discuss any questions you have with your health care provider. Document Released: 07/29/2004 Document Revised: 10/13/2015 Document Reviewed: 05/01/2015 Elsevier Interactive Patient Education  2017 [employer] Triangular Fibrocartilage Tear A triangular fibrocartilage tear is a tear in cartilage or a ligament along the pinkie side of your wrist. The cartilage and ligaments in your wrist help to cushion and support to  the bones of your wrist. What are the causes? This condition may be caused by:  Falling onto an outstretched hand and overextending your wrist.  Repetitive motions (overuse) that put too much pressure on your wrist.  What increases the risk? This condition is more likely to develop in people who:  Have one forearm bone that is shorter than the other.  Participate in sports that put pressure on the wrist, such as: ? Gymnastics. ? Tennis. ? Golf. ? Baseball. ? Racquetball. ? Hockey.  What are the signs or symptoms? Symptoms of this condition include:  Pain or tenderness on the pinkie side of your wrist.  A clicking or popping sensation in the wrist.  Reduced grip strength.  How is this diagnosed? This condition may be diagnosed based on:  Your symptoms.  Your medical history.  A physical exam. During the exam your health care provider may move your hand and wrist to determine what is causing your pain.  Tests, such as: ? An X-ray. This may be done to check for broken bones. ? An MRI. This may be done to check ligaments and cartilage and to look for broken bones that did not show up on your X-ray. ? An arthrogram. This is a kind of X-ray called that is taken after a dye is injected into your joint. ? Diagnostic arthroscopy. This is a surgical procedure that lets your health care provider see inside your wrist joint. It may be done if the cause of your wrist pain is not clear after you have other tests.  How is this treated? Treatment for this condition may include:  Resting the wrist. You may need to avoid or modify your participation in sports or other physical activity for a period of time.  Icing the wrist. This can help with swelling and pain.  Keeping the wrist raised (elevated) above your heart. This helps reduce swelling.  Using a splint or cast. This helps keep the wrist still so it can heal.  Physical therapy.  This helps restore range of motion in the  wrist and strengthen the wrist.  Anti-inflammatory medicine, such as ibuprofen. These can help reduce pain and swelling.  Surgery. More serious tears or complete tears may require surgery to repair a ligament.  Follow these instructions at home: If you have a splint:   Do not put pressure on any part of your splint until it is fully hardened. This may take several hours.  Wear it as told by your health care provider. Remove it only as told by your health care provider.  Loosen the splint if your fingers become numb and tingle, or if they turn cold and blue.  If your splint is not waterproof: ? Do not let it get wet. ? Cover it with a watertight covering when you take a bath or a shower.  Keep the splint clean. If you have a cast:  Do not put pressure on any part of your cast until it is fully hardened. This may take several hours.  Do not stick anything inside the cast to scratch your skin. Doing that increases your risk of infection.  Check the skin around the cast every day. Report any concerns to your health care provider.  You may put lotion on dry skin around the edges of the cast. Do not apply lotion to the skin underneath the cast.  If your cast is not waterproof: ? Do not let it get wet. ? Cover it with a watertight covering when you take a bath or a shower.  Keep the cast clean. Managing pain, stiffness, and swelling  Take over-the-counter and prescription medicines only as told by your health care provider.  If directed, put ice on the injured area. ? Put ice in a plastic bag. ? Place a towel between your skin and the bag. ? Leave the ice on for 20 minutes, 2-3 times a day or as needed.  Move your fingers often to avoid stiffness and to lessen swelling.  Elevate the injured area above the level of your heart while you are sitting or lying down. Activity  Return to your normal activities as told by your health care provider. Ask your health care provider  what activities are safe for you.  Do exercises only as told by your health care provider. General instructions  Do not use your wrist to support your body weight until your health care provider says that you can.  Ask your health care provider when it is safe for you to drive if you have a splint or a cast.  Keep all follow-up visits as told by your health care provider. This is important. How is this prevented?  Warm up and stretch before being active.  Cool down and stretch after being active.  Give your body time to rest between periods of activity.  Make sure to use equipment that fits you.  Be safe and responsible while being active to avoid falls.  Do at least 150 minutes of moderate-intensity exercise each week, such as brisk walking or water aerobics.  Maintain physical fitness, including: ? Strength. ? Flexibility. ? Cardiovascular fitness. ? Endurance. Contact a health care provider if:  Your wrist pain does not improve or it gets worse. Get help right away if:  You have severe pain.  You cannot move your wrist. This information is not intended to replace advice given to you by your health care provider. Make sure you discuss any questions you have with your health care provider. Document Released:  06/21/2005 Document Revised: 02/25/2016 Document Reviewed: 04/29/2015 Elsevier Interactive Patient Education  2018 [place] for Routine Care of Injuries Theroutine careofmanyinjuriesincludes rest, ice, compression, and elevation (RICE therapy). RICE therapy is often recommended for injuries to soft tissues, such as a muscle strain, ligament injuries, bruises, and overuse injuries. It can also be used for some bony injuries. Using RICE therapy can help to relieve pain, lessen swelling, and enable your body to heal. Rest Rest is required to allow your body to heal. This usually involves reducing your normal activities and avoiding use of the injured part  of your body. Generally, you can return to your normal activities when you are comfortable and have been given permission by your health care provider. Follow these instructions at home: Ice  Icing your injury helps to keep the swelling down, and it lessens pain. Do not apply ice directly to your skin.  Put ice in a plastic bag.  Place a towel between your skin and the bag.  Leave the ice on for 20 minutes, 2-3 times a day.  Do this for as long as you are directed by your health care provider. Compression Compression means putting pressure on the injured area. Compression helps to keep swelling down, gives support, and helps with discomfort. Compression may be done with an elastic bandage. If an elastic bandage has been applied, follow these general tips:  Remove and reapply the bandage every 3-4 hours or as directed by your health care provider.  Make sure the bandage is not wrapped too tightly, because this can cut off circulation. If part of your body beyond the bandage becomes blue, numb, cold, swollen, or more painful, your bandage is most likely too tight. If this occurs, remove your bandage and reapply it more loosely.  See your health care provider if the bandage seems to be making your problems worse rather than better.  Elevation  Elevation means keeping the injured area raised. This helps to lessen swelling and decrease pain. If possible, your injured area should be elevated at or above the level of your heart or the center of your chest. When should I seek medical care?  If your pain and swelling continue.  If your symptoms are getting worse rather than improving. These symptoms may indicate that further evaluation or further X-rays are needed. Sometimes, X-rays may not show a small broken bone (fracture) until a number of days later. Make a follow-up appointment with your health care provider. When should I seek immediate medical care?  If you have sudden severe pain at  or below the area of your injury.  If you have redness or increased swelling around your injury.  If you have tingling or numbness at or below the area of your injury that does not improve after you remove the elastic bandage. This information is not intended to replace advice given to you by your health care provider. Make sure you discuss any questions you have with your health care provider. Document Released: 10/03/2000 Document Revised: 08/02/2016 Document Reviewed: 05/29/2014 Elsevier Interactive Patient Education  [employer].

## 2018-04-27 NOTE — Progress Notes (Signed)
Subjective:    Patient ID: Isabella Robertson, female    DOB: [birth year], 52 y.o.   MRN: [id number]  52y/o Hispanic established female pt c/o R lateral hand pain that extends down lateral wrist, forearm to elbow. Sharp shooting pain that occurs with certain movements. Noticed it while driving yesterday evening and it has persisted since then. Pronating and supinating right hand and hyper right wrist radiates down 5th lateral digit.  Worse with driving home after work.  Denied known injury at home or work, working in [employer], falls, bruising, redness/hot to touch, swelling, rash.  Left hand dominant.     Review of Systems  Constitutional: Negative for activity change, appetite change, chills, diaphoresis, fatigue and fever.  HENT: Negative for trouble swallowing and voice change.   Eyes: Negative for photophobia and visual disturbance.  Respiratory: Negative for cough, shortness of breath, wheezing and stridor.   Cardiovascular: Negative for chest pain.  Gastrointestinal: Negative for diarrhea, nausea and vomiting.  Endocrine: Negative for cold intolerance and heat intolerance.  Genitourinary: Negative for difficulty urinating.  Musculoskeletal: Positive for myalgias. Negative for arthralgias, back pain, gait problem, joint swelling, neck pain and neck stiffness.  Skin: Negative for color change, pallor, rash and wound.  Allergic/Immunologic: Positive for environmental allergies and food allergies.  Neurological: Negative for dizziness, tremors, seizures, syncope, facial asymmetry, speech difficulty, weakness, light-headedness, numbness and headaches.  Hematological: Negative for adenopathy. Does not bruise/bleed easily.  Psychiatric/Behavioral: Negative for agitation, confusion and sleep disturbance.       Objective:   Physical Exam  Constitutional: She is oriented to person, place, and time. Vital signs are normal. She appears well-developed and well-nourished. She is active and  cooperative.  Non-toxic appearance. She does not have a sickly appearance. She does not appear ill. No distress.  HENT:  Head: Normocephalic and atraumatic.  Right Ear: Hearing and external ear normal.  Left Ear: Hearing and external ear normal.  Nose: Nose normal.  Mouth/Throat: Uvula is midline, oropharynx is clear and moist and mucous membranes are normal. No oropharyngeal exudate.  Eyes: Pupils are equal, round, and reactive to light. Conjunctivae, EOM and lids are normal. Right eye exhibits no discharge. Left eye exhibits no discharge. No scleral icterus.  Neck: Trachea normal, normal range of motion and phonation normal. Neck supple. No spinous process tenderness and no muscular tenderness present. No neck rigidity. No tracheal deviation, no edema, no erythema and normal range of motion present.  Bilateral trapezius and cervical paraspinals tight on exam nontender; applied cervical thermacare in clinic from clinic stock and instructed patient to remove in 8 hours  Cardiovascular: Normal rate, regular rhythm, normal heart sounds and intact distal pulses.  Pulses:      Radial pulses are 2+ on the right side, and 2+ on the left side.  Pulmonary/Chest: Effort normal and breath sounds normal. No stridor. No respiratory distress. She has no decreased breath sounds. She has no wheezes. She has no rhonchi. She has no rales.  Abdominal: Soft. Normal appearance. She exhibits no distension, no fluid wave and no ascites. There is no rigidity and no guarding.  Musculoskeletal: Normal range of motion. She exhibits tenderness. She exhibits no edema or deformity.       Right shoulder: Normal.       Left shoulder: Normal.       Right elbow: Normal.      Left elbow: Normal.       Right wrist: She exhibits tenderness. She exhibits normal  range of motion, no bony tenderness, no swelling, no effusion, no crepitus, no deformity and no laceration.       Left wrist: Normal.       Right hip: Normal.       Left  hip: Normal.       Right knee: Normal.       Left knee: Normal.       Cervical back: She exhibits spasm. She exhibits normal range of motion, no tenderness, no bony tenderness, no swelling, no edema, no deformity, no laceration and no pain.       Thoracic back: Normal.       Lumbar back: Normal.       Right upper arm: Normal.       Left upper arm: Normal.       Right forearm: Normal.       Left forearm: Normal.       Right hand: She exhibits tenderness. She exhibits normal range of motion, no bony tenderness, normal two-point discrimination, normal capillary refill, no deformity, no laceration and no swelling. Normal sensation noted. Normal strength noted. She exhibits no finger abduction, no thumb/finger opposition and no wrist extension trouble.       Left hand: Normal.       Hands: Full arom bilateral hands/fingers/wrists but right wrist pain with hyperextension with and without resistance; bilaterally negative phalens and tinnels; no edema/crepitus/erythema/ecchymosis; tender to deep palpation distal ulna/TFCC, lateral 5th MCP soft tissue/lateral 5th digit; finger joints not TTP/swollen  Lymphadenopathy:       Head (right side): No submental, no submandibular, no tonsillar, no preauricular, no posterior auricular and no occipital adenopathy present.       Head (left side): No submental, no submandibular, no tonsillar, no preauricular, no posterior auricular and no occipital adenopathy present.    She has no cervical adenopathy.       Right cervical: No superficial cervical, no deep cervical and no posterior cervical adenopathy present.      Left cervical: No superficial cervical, no deep cervical and no posterior cervical adenopathy present.  Neurological: She is alert and oriented to person, place, and time. She has normal strength. She is not disoriented. She displays no atrophy, no tremor and normal reflexes. No cranial nerve deficit or sensory deficit. She exhibits normal muscle tone. She  displays no seizure activity. Coordination and gait normal. GCS eye subscore is 4. GCS verbal subscore is 5. GCS motor subscore is 6.  Extremity strength equal bilaterally upper and lower and hand grasp equal 5/5; on/off exam table; in/out of chair without difficulty; gait sure and steady in hallway  Skin: Skin is warm, dry and intact. Capillary refill takes less than 2 seconds. No abrasion, no bruising, no burn, no ecchymosis, no laceration, no lesion, no petechiae and no rash noted. She is not diaphoretic. No cyanosis or erythema. No pallor. Nails show no clubbing.  Psychiatric: She has a normal mood and affect. Her speech is normal and behavior is normal. Judgment and thought content normal. She is not actively hallucinating. Cognition and memory are normal. She is attentive.      Fitted and distributed wrist splint right small from clinic stock and compression wrap right hand velcro.    Assessment & Plan:  A-muscle spasm, wrist/5th digit strain right initial encounter  P-  Ibuprofen 800mg  po TID prn pain given 2 UD from clinic stock and patient reported she has more at home.  Home stretches demonstrated to patient-e.g. Arm circles, walking  up wall, chest stretches, neck AROM, chin tucks, knee to chest and rock side to side on back. Self massage or professional prn, foam roller use or tennis/racquetball.  Heat/cryotherapy 15 minutes QID prn.  Trial thermacare 1 applied from clinic stock.  Consider physical therapy referral if no improvement with prescribed therapy from [facility] and/or chiropractic care.  Ensure ergonomics correct desk at work avoid repetitive motions if possible/holding phone/laptop in hand use desk/stand and/or break up lifting items into smaller loads/weights.  Patient was instructed to rest, ice, and ROM exercises.  Activity as tolerated.   Follow up if symptoms persist or worsen especially if loss of bowel/bladder control, arm/leg weakness and/or saddle paresthesias.  Exitcare handout  on muscle spasms and rehab exercises.  Patient verbalized agreement and understanding of treatment plan and had no further questions at this time.  P2:  Injury Prevention and Fitness.  Probable TFCC strain right.  Wear wrist splint except during bathing and gentle AROM at home.  Wear when sleeping also  Patient was instructed to rest, ice and elevate hand/wrist after work at home or on break.  She has cold pack reusable at home. Exitcare handout on wrist pain/strain. Medications as directed. Motrin 800mg  po TID prn pain x 2 weeks take with food OTC.   Suspect overuse soft tissue injury related pain due to ramped up fitness schedule. Call or return to clinic as needed if these symptoms worsen or fail to improve as anticipated and will consider PT and orthopedics evaluation. Patient verbalized agreement and understanding of treatment plan. P2: ROM, injury prevention

## 2018-06-20 ENCOUNTER — Encounter: Payer: Self-pay | Admitting: Registered Nurse

## 2018-06-20 ENCOUNTER — Ambulatory Visit: Payer: Self-pay | Admitting: Registered Nurse

## 2018-06-20 VITALS — BP 92/77 | HR 87 | Temp 98.2°F

## 2018-06-20 DIAGNOSIS — M25561 Pain in right knee: Secondary | ICD-10-CM

## 2018-06-20 DIAGNOSIS — M79642 Pain in left hand: Secondary | ICD-10-CM

## 2018-06-20 DIAGNOSIS — M542 Cervicalgia: Secondary | ICD-10-CM

## 2018-06-20 DIAGNOSIS — R7989 Other specified abnormal findings of blood chemistry: Secondary | ICD-10-CM

## 2018-06-20 MED ORDER — ACETAMINOPHEN 500 MG PO TABS: 1000.0000 mg | ORAL_TABLET | Freq: Four times a day (QID) | ORAL | 0 refills | Status: AC | PRN

## 2018-06-20 MED ORDER — MENTHOL (TOPICAL ANALGESIC) 4 % EX GEL: 1.0000 | Freq: Four times a day (QID) | CUTANEOUS | 0 refills | Status: AC | PRN

## 2018-06-20 NOTE — Patient Instructions (Addendum)
Dolor de Insurance risk surveyor (Hand Pain) El dolor de la mano puede tener muchas causas. Algunas causas frecuentes son las siguientes:  Una lesin.  Repetir el mismo movimiento con la mano una y otra vez (uso excesivo).  Osteoporosis.  Artritis.  Bultos en los tendones o las articulaciones de la mano y la [place] (quistes ganglionares).  Infeccin. INSTRUCCIONES PARA EL CUIDADO EN EL HOGAR Est atento a cualquier cambio en los sntomas. Tome estas medidas para aliviar las molestias:  Si se lo indican, aplique hielo sobre la zona afectada: ? Ponga el hielo en una bolsa plstica. ? Coloque una [employer] piel y la bolsa de hielo. ? Deje el hielo durante 15 a 20 minutos, 3 a 4veces por da, durante 2das.  Tome los medicamentos de venta libre y los recetados solamente como se lo haya indicado el mdico.  Reduzca al [employer] la carga [place] manos y las muecas tanto como sea posible.  Tome descansos frecuentes cuando realiza actividades repetidas.  Haga estiramientos como se lo haya indicado el mdico.  No haga las actividades que intensifican Conservation officer, historic buildings. SOLICITE ATENCIN MDICA SI:  El dolor no mejora despus de unos das de cuidados personales.  El dolor [place].  El dolor afecta su capacidad de Optometrist las actividades cotidianas. [place] DE INMEDIATO SI:  La herida est roja, hinchada o caliente.  La mano se le adormece o tiene hormigueo.  Tiene la mano muy hinchada o deformada.  La mano o los dedos se tornan de color blanco o azul.  No puede mover la mano, la [employer]. Esta informacin no tiene Marine scientist el consejo del mdico. Asegrese de hacerle al mdico cualquier pregunta que tenga. Document Released: 10/13/2015 Document Revised: 10/13/2015 Document Reviewed: 07/17/2014 Elsevier Interactive Patient Education  2018 [place] ganglionar (Ganglion Cyst) Un quiste ganglionar es un bulto no canceroso lleno de lquido que  se forma cerca de las articulaciones o los tendones. El quiste ganglionar crece fuera de una articulacin o de la membrana de un tendn. La [employer] de las veces aparece en la mano o la [place], pero tambin puede formarse en el hombro, el codo, la cadera, la rodilla, el tobillo o el pie. El quiste ganglionar redondo u ovalado puede tener el tamao de un guisante o ser ms grande que [place]. El incremento de la actividad puede aumentar su tamao porque empieza a acumularse ms lquido. CAUSAS Se desconoce qu causa el crecimiento de un quiste ganglionar. Sin embargo, puede guardar relacin con:  Inflamacin o irritacin alrededor de la articulacin.  Una lesin.  Los movimientos repetitivos o el uso excesivo.  Artritis. FACTORES DE RIESGO Entre los factores de riesgo se incluyen los siguientes:  Ser mujer.  Tener entre 62 y 63aos. [place] los sntomas se pueden incluir los siguientes:  Un bulto. Este aparece con ms frecuencia en la mano o la [place], pero tambin puede presentarse en otras zonas del cuerpo.  Hormigueo.  Dolor.  Entumecimiento.  Debilidad muscular.  Sujecin dbil.  Prdida del movimiento de Insurance claims handler. DIAGNSTICO La mayora de las veces los quistes ganglionares se diagnostican en funcin de un examen fsico. El mdico palpar el bulto y puede iluminarlo para ver si la luz pasa a travs de [place]. Si es un quiste ganglionar, la luz suele traspasarlo. El mdico puede indicarle una radiografa, una ecografa o una resonancia magntica para descartar otras afecciones. TRATAMIENTO Generalmente, los quistes ganglionares desaparecen solos sin tratamiento.  Si hay dolor u otros sntomas, tal vez se necesite tratamiento. Tambin es necesario un tratamiento si el quiste ganglionar limita sus movimientos o si se infecta. El tratamiento puede incluir lo siguiente:  Usar una frula o una tablilla en la mueca o el dedo.  Medicamentos  antiinflamatorios.  Extraer lquido del bulto con Ardelia Mems aguja (aspiracin).  Inyectar un corticoide en la articulacin.  Ciruga para extirpar Web designer. INSTRUCCIONES PARA EL CUIDADO EN EL HOGAR  No haga presin Secretary/administrator, no lo pinche con una aguja ni lo golpee.  Tome los medicamentos solamente como se lo haya indicado el mdico.  Use la frula o la tablilla como se lo haya indicado el mdico.  Controle el quiste ganglionar para Actuary cambio.  Concurra a todas las visitas de control como se lo haya indicado el mdico. Esto es importante. SOLICITE ATENCIN MDICA SI:  El quiste ganglionar se agranda o le provoca ms dolor.  Aumenta el enrojecimiento, las lneas rojas o la hinchazn.  Observa que sale pus del bulto.  Siente debilidad o adormecimiento alrededor de la zona afectada.  Tiene fiebre o siente escalofros. Esta informacin no tiene Marine scientist el consejo del mdico. Asegrese de hacerle al mdico cualquier pregunta que tenga. Document Released: 03/31/2005 Document Revised: 07/12/2014 Document Reviewed: 12/04/2013 Elsevier Interactive Patient Education  2018 [place].  Tendinitis rotuliana con rehabilitacin (Patellar Tendinitis With Rehab) La tendinitis rotuliana, tambin llamada rodilla de saltador, es la inflamacin del tendn de la rtula. Los tendones son tejidos similares a cordones que [employer] a los [place]. El tendn de la rtula conecta la parte inferior de la rtula a la parte superior del hueso de la canilla (tibia). La tendinitis rotuliana causa dolor en la parte de adelante de la rodilla. Esta afeccin tiene las siguientes etapas:  Etapa 1: En esta etapa, siente dolor solo despus de Conservation officer, nature.  Etapa 2: En esta etapa, siente dolor durante y despus de Conservation officer, nature.  Etapa 3: En esta etapa, siente dolor durante y despus de realizar [place], que limita su  capacidad para Electrical engineer.  Etapa4: En esta etapa, el tendn se rompe y limita su capacidad para Public relations account executive. CAUSAS Esta afeccin es causada por la sobrecarga repetitiva del tendn. La sobrecarga puede provocar que el tendn se estire, se hinche, se engrose o se desgarre. FACTORES DE RIESGO Los siguientes factores pueden hacer que usted sea ms propenso a sufrir esta afeccin:  Careers information officer que impliquen correr, patear y saltar, en especial, sobre superficies duras. Estos incluyen los siguientes: ? Bsquet. ? Vleibol. ? Ftbol. ? Atletismo.  Tener los msculos del muslo tensos.  Haberse aplicado inyecciones de esteroides en el tendn.  Haberse sometido a [place] de rodilla.  Tener entre 41 y 46aos.  Tener artritis reumatoide o diabetes.  Entrenar en exceso. SNTOMAS El sntoma principal de esta afeccin es Conservation officer, historic buildings en la parte de adelante de la rodilla. Por lo general, el dolor comienza de a poco y [place] de forma gradual. Es posible que sienta dolor al extender la pierna. DIAGNSTICO Esta afeccin se puede diagnosticar en funcin de lo siguiente:  Sus sntomas.  Los antecedentes mdicos.  Un examen fsico. Durante el examen fsico, es posible que el mdico verifique si siente dolor a la palpacin en la rtula, si los msculos del muslo estn tensos y si le duele al extender la rodilla.  Pruebas de diagnstico por imgenes, como: ?  Radiografas. Muestran la posicin y el estado de la rtula. ? Una resonancia magntica (RM). Comprueba si el tendn est desgarrado. ? Una ecografa. Comprueba si el tendn est hinchado y verifica el grosor del tendn. TRATAMIENTO El tratamiento de esta afeccin depende de la etapa en la que se encuentra. Este puede incluir lo siguiente:  Product/process development scientist las actividades que causen Social research officer, government.  Aplicar hielo en la rodilla.  Tomar antiinflamatorios no esteroides Dayna Ramus) para reducir Conservation officer, historic buildings y la  hinchazn.  Hacer ejercicios de elongacin y fortalecimiento (fisioterapia) cuando el dolor y la hinchazn mejoren.  Realizarse estimulacin a travs de ondas sonoras para promover el proceso de curacin.  Usar un dispositivo ortopdico para la rodilla. Es posible que esto sea necesario si su afeccin no mejora con Dispensing optician.  Usar [employer] o un andador. Es posible que esto sea necesario si su afeccin no mejora con Dispensing optician.  Someterse a [place]. Esta puede ser necesaria si su tendinitis se encuentra en la etapa4. INSTRUCCIONES PARA EL CUIDADO EN EL HOGAR Si tiene un dispositivo ortopdico en la rodilla:  selo como se lo haya indicado el mdico. Quteselo solamente como se lo haya indicado el mdico.  Afloje el dispositivo ortopdico si los dedos de los pies se le entumecen, siente hormigueos o se le enfran y se tornan de Optician, dispensing.  No deje que el dispositivo ortopdico se moje si no es impermeable.  Mantenga el dispositivo ortopdico limpio.  Pregntele al mdico cundo es seguro volver a Forensic psychologist. Control del dolor, la rigidez y Engineer, structural los medicamentos de venta libre y los recetados solamente como se lo haya indicado el mdico.  Si se lo indican, aplique hielo sobre la zona lesionada. ? Ponga el hielo en una bolsa plstica. ? Coloque una [employer] piel y la bolsa de hielo. ? Coloque el hielo durante 54minutos, 2 a 3veces por da.  Mueva los dedos de los pies con frecuencia para evitar que se entumezcan y para reducir la hinchazn.  Cuando est sentado o acostado, levante (eleve) la rodilla por encima del nivel del corazn. Actividad  Retome sus actividades normales como se lo haya indicado el mdico. Pregntele al mdico qu actividades son seguras para usted.  Haga ejercicios como se lo haya indicado el mdico. Instrucciones generales  No apoye el peso del cuerpo sobre la extremidad [employer] que lo autorice el mdico. Use las  muletas o el andador como se lo haya indicado el mdico.  Consulting civil engineer a todas las visitas de control como se lo haya indicado el mdico. Esto es importante. PREVENCIN  Precaliente y elongue adecuadamente antes de la [place].  Reljese y elongue despus de realizar [place].  Dele a su cuerpo tiempo para [employer] perodos de [place].  Asegrese de usar el equipo que sea apto para usted.  Tome medidas de seguridad y sea responsable al hacer [place] Park[place], para evitar las cadas.  Haga por lo menos 120minutos de ejercicios de intensidad moderada cada semana, como caminar a paso ligero o hacer gimnasia acutica.  Mantenga un buen estado fsico, esto incluye lo siguiente: ? La fuerza. ? La flexibilidad. ? La capacidad cardiovascular. ? La resistencia. SOLICITE ATENCIN MDICA SI:  Los sntomas no mejoran en 6semanas.  Los sntomas empeoran. Esta informacin no tiene Marine scientist el consejo del mdico. Asegrese de hacerle al mdico cualquier pregunta que tenga. Document Released: 04/07/2006 Document Revised: 11/05/2014 Document Reviewed: 03/25/2015 Elsevier Interactive Patient Education  2018 Elsevier  Inc.  Artritis (Arthritis) El significado del trmino artritis es dolor de las articulaciones. Tambin puede significar enfermedad articular. Una articulacin es TEFL teacher de unin de [place]. Las personas que sufren artritis pueden tener lo siguiente:  Enrojecimiento de las articulaciones.  Inflamaciones articulares.  Articulaciones rgidas.  Articulaciones calientes.  Cristy Hilts.  Sensacin de estar enfermo. CUIDADOS EN EL HOGAR Est atento a cualquier cambio en los sntomas. Tome estas medidas para Best boy y la hinchazn. Medicamentos  [employer] de venta libre y los recetados solamente como se lo haya indicado el mdico.  No tome aspirina para Best boy si el mdico le dice que puede tener gota. Actividades  Ponga  la articulacin en reposo si el mdico se lo indica.  Evite las actividades que intensifiquen Conservation officer, historic buildings.  Haga ejercicios con la articulacin como se lo haya indicado el mdico. Intente hacer ejercicios tales como: ? Natacin. ? Gimnasia acutica. ? Andar en bicicleta. ? Caminar. Cuidado de la articulacin  Si la articulacin est hinchada, mantngala elevada si el mdico se lo indic.  Si al despertar por la maana, nota que la articulacin est rgida, intente tomar una ducha con agua tibia.  Si es diabtico, no se ponga calor sin consultarle al mdico.  Si se lo indican, pngase calor en la articulacin: ? Coloque una toalla entre la articulacin y la compresa caliente o la almohadilla trmica. ? Coloque el calor en la zona durante 20 o 67minutos.  Si se lo indican, pngase hielo en la articulacin: ? Ponga el hielo en una bolsa plstica. ? Coloque una [employer] piel y la bolsa de hielo. ? Coloque el hielo durante 22minutos, 2 a 3veces por da.  Concurra a todas las visitas de control como se lo haya indicado el mdico. SOLICITE AYUDA SI:  El dolor empeora.  Tiene fiebre. SOLICITE AYUDA DE INMEDIATO SI:  Siente un dolor muy intenso en la articulacin.  Se le hincha la articulacin.  La articulacin est enrojecida.  Siente dolor en muchas articulaciones y se le hinchan.  Siente un dolor muy intenso en la espalda.  Tiene la pierna muy dbil.  No puede controlar la miccin (orina) o la defecacin (materia fecal). Esta informacin no tiene Marine scientist el consejo del mdico. Asegrese de hacerle al mdico cualquier pregunta que tenga. Document Released: 12/21/2011 Document Revised: 10/13/2015 Document Reviewed: 09/16/2014 Elsevier Interactive Patient Education  2018 [place] de vitaminaD (Vitamin D Deficiency) La deficiencia de vitaminaD ocurre cuando el organismo no tiene suficiente vitaminaD. La vitaminaD es importante por  lo siguiente:  Ayuda al organismo a usar otros minerales que necesita.  Ayuda a [employer] fuertes y sanos.  Puede ayudar a [place].  Ayuda al buen funcionamiento del corazn y de otros msculos. Para obtener vitaminaD, puede hacer lo siguiente:  Consuma alimentos que contengan vitaminaD.  Consuma o beba leche u otros alimentos con agregado de vitaminaD.  Tome un suplemento de vitaminaD.  Expngase al sol. La ingesta insuficiente de vitaminaD puede reblandecer los huesos. Tambin puede causar otros problemas de [place]. [place] los medicamentos y los suplementos solamente como se lo haya indicado el mdico.  Consuma alimentos que contengan vitaminaD, entre ellos: ? Productos lcteos, cereales o jugos con agregado de vitaminaD. Revise las etiquetas de los productos para saber si contienen vitaminaD. ? Pescados grasos, como el salmn o la [place]. ? Huevos. ? Ostras.  No use camas solares.  Mantenga un peso saludable. Baje de peso, si es necesario.  Concurra a todas las visitas de control como se lo haya indicado el mdico. Esto es importante. SOLICITE AYUDA SI:  Los sntomas no desaparecen.  Siente malestar estomacal (nuseas).  Vomita.  Defeca menos que lo habitual o tiene dificultades para defecar (estreimiento). Esta informacin no tiene Marine scientist el consejo del mdico. Asegrese de hacerle al mdico cualquier pregunta que tenga. Document Released: 06/10/2011 Document Revised: 03/12/2015 Document Reviewed: 11/06/2014 Elsevier Interactive Patient Education  2018 [employer].

## 2018-06-20 NOTE — Progress Notes (Signed)
Subjective:    Patient ID: Isabella Robertson, female    DOB: 1966/03/27, 52 y.o.   MRN: [id number]  52y/o established female pt c/o L anterior wrist and palmar hand pain x2 weeks. Sts she has felt "knots" in the area that swell and go back down. Does not notice that lifting or grasping things makes pain worse or better. Sts over the weekend she noticed that her fingers were turning purple in color, progressively over the day Saturday. They became cool and tingling. She applied vaporub cream to them and went to bed and they felt and looked better in the morning. No color change noted today by RN Hildred Alamin and patient reported tingling resolved/denied pain today.  Has not taken any oral pain medications.   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  HENT: Negative for trouble swallowing and voice change.   Eyes: Negative for photophobia and visual disturbance.  Respiratory: Negative for cough, shortness of breath, wheezing and stridor.   Cardiovascular: Negative for leg swelling.  Gastrointestinal: Negative for abdominal pain, diarrhea, nausea and vomiting.  Endocrine: Negative for cold intolerance and heat intolerance.  Genitourinary: Negative for difficulty urinating.  Musculoskeletal: Positive for arthralgias and myalgias. Negative for back pain, gait problem, joint swelling, neck pain and neck stiffness.  Skin: Positive for color change and pallor. Negative for rash and wound.  Allergic/Immunologic:  Positive for food allergies.  Neurological: Negative for dizziness, tremors, seizures, syncope, facial asymmetry, speech difficulty, weakness, light-headedness, numbness and headaches.  Hematological: Negative for adenopathy. Does not bruise/bleed easily.  Psychiatric/Behavioral: Negative for agitation, confusion and sleep disturbance.       Objective:   Physical Exam Vitals signs and nursing note reviewed.  Constitutional:      General: She is not in acute distress.    Appearance: Normal appearance. She is well-developed, well-groomed and normal weight. She is not ill-appearing, toxic-appearing or diaphoretic.  HENT:     Head: Normocephalic and atraumatic.     Right Ear: External ear normal.     Left Ear: External ear normal.     Nose: Nose normal.     Mouth/Throat:     Mouth: Mucous membranes are moist.     Pharynx: No oropharyngeal exudate or posterior oropharyngeal erythema.  Eyes:     General: Lids are normal. No visual field deficit or scleral icterus.       Right eye: No discharge.        Left eye: No discharge.     Extraocular Movements: Extraocular movements intact.     Conjunctiva/sclera: Conjunctivae normal.     Pupils: Pupils are equal, round, and reactive to light.  Neck:     Musculoskeletal: Normal range of motion and neck supple. Normal range of motion. No edema, erythema, neck rigidity, crepitus, injury, pain with movement, torticollis or muscular tenderness.     Trachea: Trachea and phonation normal.  Cardiovascular:     Rate and Rhythm: Normal rate and regular rhythm.     Pulses: Normal pulses.  Radial pulses are 2+ on the right side and 2+ on the left side.     Heart sounds: Normal heart sounds, S1 normal and S2 normal. Heart sounds not distant. No murmur.  Pulmonary:     Effort: Pulmonary effort is normal. No respiratory distress.     Breath sounds: Normal breath sounds and air entry. No stridor, decreased air movement or transmitted upper airway  sounds. No decreased breath sounds, wheezing, rhonchi or rales.     Comments: Spoke full sentences without difficulty; no cough observed Abdominal:     General: Abdomen is flat. There is no distension.     Palpations: Abdomen is soft.     Tenderness: There is no guarding.  Musculoskeletal: Normal range of motion.        General: No swelling or signs of injury.     Right shoulder: Normal.     Left shoulder: She exhibits crepitus. She exhibits normal range of motion, no tenderness, no bony tenderness, no swelling, no effusion, no deformity, no laceration, no pain, no spasm, normal pulse and normal strength.     Right elbow: Normal.    Left elbow: Normal.     Right wrist: Normal.     Left wrist: Normal.     Right hip: Normal.     Left hip: Normal.     Right knee: Normal.     Left knee: Normal.     Left hand: She exhibits tenderness and swelling. She exhibits normal range of motion, no bony tenderness, normal two-point discrimination, normal capillary refill and no laceration. Normal sensation noted.       Hands:  Lymphadenopathy:     Head:     Right side of head: No submental, submandibular, tonsillar, preauricular, posterior auricular or occipital adenopathy.     Left side of head: No submental, submandibular, tonsillar, preauricular, posterior auricular or occipital adenopathy.     Cervical: No cervical adenopathy.     Right cervical: No superficial cervical adenopathy.    Left cervical: No superficial cervical adenopathy.  Skin:    General: Skin is warm and dry.     Capillary Refill: Capillary refill takes less than 2 seconds.     Coloration: Skin is not ashen, cyanotic, jaundiced, mottled, pale or sallow.     Findings: No abrasion, abscess, acne, bruising, burn, ecchymosis, erythema, signs of injury, laceration, lesion, petechiae, rash or wound.     Nails: There is no clubbing.   Neurological:     General: No focal deficit present.     Mental Status: She is alert and oriented to  person, place, and time. Mental status is at baseline.     GCS: GCS eye subscore is 4. GCS verbal subscore is 5. GCS motor subscore is 6.     Cranial Nerves: No cranial nerve deficit, dysarthria or facial asymmetry.     Sensory: Sensation is intact. No sensory deficit.     Motor: Motor function is intact. No weakness, tremor, atrophy, abnormal muscle tone or seizure activity.     Coordination: Coordination is intact. Coordination normal.     Gait: Gait is intact. Gait normal.     Comments: On/off exam table; in/out of chair without difficulty; opposition with resistance and without fingers 5/5; upper extremity strength equal bilaterally 5/5; gait sure and steady in hallway  Psychiatric:        Mood and Affect: Mood normal.        Speech: Speech normal.  Behavior: Behavior normal. Behavior is cooperative.        Thought Content: Thought content normal.        Judgment: Judgment normal.     Nodule thenar 43mm left base slightly TTP; negative tinnels and phalens; some discomfort in left index finger with full wrist AROM flexion and holding 30 seconds; bilateral knees full arom without crepitus or pain today no edema or rash noted; neck full AROM without crepitus; paraspinals and trapezius tight bilaterally.      Assessment & Plan:  A-history Vitamin D low, right anterior knee pain, muscle pain neck, left hand pain acute  P-last vitamin D level 2013 19 will draw another level with [facility] in 2020.  Discussed with patient on sunny warm days to try and get 15 minutes sunshine per day otherwise supplement D3 1000 international units po daily recommended.  Discussed with patient vitamin D levels tend to decrease during winter months due to cold weather increased wear of long sleeves/pants and staying inside.  Discussed symptoms of low vitamin D can be bone/muscle aches. Exitcare handout vitamin D deficiency Patient verbalized understanding and agreement of plan of care and had no further  questions at this time.  Discussed with patient could be due to increased activity at work e.g. bending/lifting for holiday season resulting in patellar tendonitis/bursitis, arthritis due to colder weather flare consider tylenol 1000mg  po QID prn pain/heat/gentle AROM/epsom salt soak.  Patient to follow up for re-evaluation when having symptoms.  Exitcare handout on arthritis/patellar tendonitis.  Patient verbalized understanding information/instructions, agreed with plan of care and had no futher questions at this time.  Exitcare handout printed and given on hand pain and ganglion cyst.  Fitted and distributed thumb spica left patient to wear at work and when sleeping x 2 weeks.  May remove to shower and perform arom TID.  Avoid impact to left hand. Suspect Ganglion cyst left hand, ice 15 minutes TID, biofreeze gel topical QID prn pain given 4 UD from clinic stock, tylenol 1000mg  po QID prn pain given 8 UD from clinic stock  Patient to follow up for re-evaluation if worsening with plan or care e.g. erythema/rash/weakness or no improvement with 2 weeks plan of care.  Patient verbalized understanding information/instructions, agreed with plan of care and had no further questions at this time.  Tylenol 1000mg  po q6h prn pain given 4 UD from clinic stock and biofreeze gel apply QID prn pain given 4 UD from clinic stock  Patient reported soreness.  Full AROM denied loss of bowel/bladder function/arm/leg weakness or paresthesias.  Home stretches demonstrated to patient-e.g. Arm circles, walking up wall, chest stretches, neck AROM, chin tucks, knee to chest and rock side to side on back. Self massage or professional prn, foam roller use or tennis/racquetball.  Heat/cryotherapy 15 minutes QID prn.  Trial thermacare 1 applied and another given to patient for use tomorrow from clinic stock.  Consider physical therapy referral if no improvement with prescribed therapy from [facility] and/or chiropractic care.  Ensure  ergonomics correct desk at work avoid repetitive motions if possible/holding phone/laptop in hand use desk/stand and/or break up lifting items into smaller loads/weights.  Patient was instructed to rest, ice, and ROM exercises.  Activity as tolerated.   Follow up if symptoms persist or worsen especially if loss of bowel/bladder control, arm/leg weakness and/or saddle paresthesias.    Patient verbalized agreement and understanding of treatment plan and had no further questions at this time.  P2:  Injury Prevention and  Fitness.

## 2018-07-11 ENCOUNTER — Other Ambulatory Visit: Payer: Self-pay | Admitting: Internal Medicine

## 2018-07-11 DIAGNOSIS — R110 Nausea: Secondary | ICD-10-CM

## 2018-07-11 DIAGNOSIS — Z8619 Personal history of other infectious and parasitic diseases: Secondary | ICD-10-CM

## 2018-07-11 DIAGNOSIS — R1084 Generalized abdominal pain: Secondary | ICD-10-CM

## 2018-08-17 ENCOUNTER — Encounter: Payer: Self-pay | Admitting: Registered Nurse

## 2018-08-17 ENCOUNTER — Ambulatory Visit: Payer: Self-pay | Admitting: Registered Nurse

## 2018-08-17 VITALS — BP 96/70 | HR 73 | Temp 97.9°F

## 2018-08-17 DIAGNOSIS — R1010 Upper abdominal pain, unspecified: Secondary | ICD-10-CM

## 2018-08-17 MED ORDER — OMEPRAZOLE 20 MG PO CPDR: 40.0000 mg | DELAYED_RELEASE_CAPSULE | Freq: Every day | ORAL | 0 refills | Status: DC

## 2018-08-17 NOTE — Patient Instructions (Signed)
Schedule with GI consider repeat H Pylori testing Increase omeprazole to 20mg  2 tabs per day with food  Diverticulitis Diverticulitis  La diverticulitis es la infeccin o inflamacin de pequeas bolsas (divertculos) en el colon que se forman por una afeccin llamada diverticulosis. Los divertculos pueden atrapar las heces (materia fecal) y las bacterias, lo cual causa infeccin e inflamacin. La diverticulitis puede causar dolor intenso de estmago y [place]. Puede causar daos en los tejidos del colon que provocan sangrado. Los divertculos tambin pueden explotar (ruptura) y causar que las heces infectadas ingresen a otras reas del abdomen. Entre las complicaciones de la diverticulitis, se incluyen las siguientes:  Hemorragia.  Infeccin grave.  Dolor intenso.  Ruptura (perforacin) del colon.  Bloqueo (obstruccin) del colon. Cules son las causas? Esta afeccin es causada por las heces que quedan atrapadas en los divertculos, lo cual permite que las bacterias crezcan en ellos. Esto causa inflamacin e infeccin. Qu incrementa el riesgo? Es ms probable que tenga esta afeccin si:  Tiene diverticulosis. El riesgo de tener diverticulosis aumenta si usted: ? Tiene sobrepeso u es obeso. ? Consume productos que contienen tabaco. ? No realiza suficiente actividad fsica.  Lleva una dieta que no incluye la suficiente cantidad [place]. Los alimentos ricos en fibra incluyen frutas, verduras, frijoles, frutos secos y cereales integrales. Cules son los signos o los sntomas? Los sntomas de esta afeccin pueden incluir los siguientes:  Dolor y sensibilidad en el abdomen. El dolor en general se siente del lado izquierdo del abdomen, pero puede sentirse en otras zonas.  Cristy Hilts y escalofros.  Meteorismo.  Calambres.  Nuseas.  Vmitos.  Cambios en los hbitos intestinales.  Sangre en las heces. Cmo se diagnostica? Esta afeccin se diagnostica en funcin de lo  siguiente:  Sus antecedentes mdicos.  Un examen fsico.  Pruebas para descartar otros motivos que causen la afeccin. Estos estudios pueden [employer] siguientes: ? Anlisis de [place]. ? Anlisis de [place]. ? Estudios por imgenes del abdomen, entre ellos, radiografas, ecografas, resonancias magnticas (RM) o exploraciones por tomografa computarizada (TC). Cmo se trata? La mayora de los casos de esta afeccin son leves y pueden tratarse Financial planner. El tratamiento puede incluir lo siguiente:  Tomar analgsicos de Radio broadcast assistant.  Seguir una dieta lquida absoluta.  Tomar antibiticos por va oral.  Hacer reposo. Puede que los casos ms graves deban tratarse en el hospital. El tratamiento puede incluir lo siguiente:  No comer ni beber nada.  Tomar analgsicos recetados.  Recibir antibiticos a travs de un tubo (catter) intravenoso.  Recibir lquidos y alimentos a travs de un tubo (catter) intravenoso.  Ciruga. Cuando la afeccin est controlada, el mdico puede recomendarle que se haga una colonoscopia. Se trata de un examen que se realiza para examinar todo el intestino grueso. Durante el examen, se introduce un tubo lubricado y flexible en el ano que luego se lleva hasta el recto, el colon y otras partes del intestino grueso. Mediante una colonoscopia, se puede determinar la gravedad de los divertculos y si hay algo ms que pueda estar causando los sntomas. Siga estas indicaciones en su casa: [place] medicamentos de venta libre y los recetados solamente como se lo haya indicado el mdico. Estos incluyen suplementos de [place], probiticos y laxantes.  Si le recetaron un antibitico, tmelo como se lo haya indicado el mdico. No deje de tomar los antibiticos aunque comience a Sports administrator.  No conduzca ni opere maquinaria pesada mientras toma analgsicos recetados. Instrucciones generales  Siga las indicaciones del mdico acerca de si debe seguir  una dieta lquida o de otro tipo. Cuando los sntomas mejoren, el mdico puede indicarle que modifique la dieta. Puede recomendarle que consuma una dieta con al menos 25g (25gramos) de fibra a diario. La fibra facilita la eliminacin de heces. Sadorus: ? Frutos rojos. Una taza contiene de 4 a 8gramos de [place]. ? Frijoles y lentejas. Media taza contiene de 5 a 8gramos de [place]. ? Vegetales de Boeing. Una taza contiene 4gramos de [place].  Realizar al menos 60minutos de actividad fsica diaria, 3veces por semana. El ejercicio debe tener la intensidad suficiente como para aumentar la frecuencia cardaca y Risk manager.  Concurra a todas las visitas de seguimiento como se lo haya indicado el mdico. Esto es importante. Es posible que necesite una colonoscopia. Comunquese con un mdico si:  El dolor no mejora.  Le resulta difcil beber o comer.  Las deposiciones no se normalizan. Solicite ayuda de inmediato si:  El [place] representative.  Los sntomas no mejoran con Dispensing optician.  Los sntomas empeoran repentinamente.  Tiene fiebre.  Vomita ms de una vez.  Las heces son sanguinolentas, negras o alquitranadas. Resumen  La diverticulitis es la infeccin o inflamacin de pequeas bolsas (divertculos) en el colon que se forman por una afeccin llamada diverticulosis. Los divertculos pueden atrapar las heces (materia fecal) y las bacterias, lo cual causa infeccin e inflamacin.  Usted tiene un riesgo mayor de tener esta afeccin si sufre de diverticulosis y sigue una dieta que no incluye la suficiente cantidad de [place].  La mayora de los casos de esta afeccin son leves y pueden tratarse Financial planner. Puede que los casos ms graves deban tratarse en el hospital.  Cuando la afeccin est controlada, el mdico puede recomendarle que se haga un examen llamado colonoscopia. Este examen puede mostrar la gravedad de los divertculos y si hay algo ms que  pueda estar causando los sntomas. Esta informacin no tiene Marine scientist el consejo del mdico. Asegrese de hacerle al mdico cualquier pregunta que tenga. Document Released: 03/31/2005 Document Revised: 10/11/2016 Document Reviewed: 10/11/2016 Elsevier Interactive Patient Education  2019 [place] de alimentacin con bajo contenido de FODMAP Low-FODMAP Eating Plan  FODMAP es el acrnimo en ingls de oligosacridos, disacridos, monosacridos y polioles fermentables y hace referencia a los azcares que son difciles de digerir para Psychologist, clinical. Un plan de alimentacin con bajo contenido de FODMAP puede ayudar a algunas personas que tienen enfermedades de los intestinos (intestinales) a Chief Technology Officer sus sntomas. Seguir [place] [employer] plan de alimentacin puede resultar complicado. Consulte con un especialista en dietas y nutricin (nutricionista) para disear un plan de alimentacin con bajo contenido de FODMAP que sea adecuado para usted. Un nutricionista puede asegurarse de que consuma suficientes nutrientes con este plan de alimentacin. Cules son algunos consejos para seguir este plan? Leer las etiquetas de los alimentos  Lea las etiquetas para detectar FODMAP ocultos, por ejemplo: ? Jarabe de alta fructosa. ? Miel. ? Agave. ? Saborizantes de frutas naturales. ? Ajo o cebolla en polvo.  Elija alimentos con bajo contenido de FODMAP que contengan 3 o 4 gramos de fibra por porcin.  Consulte las etiquetas de los alimentos para Civil engineer, contracting los tamaos de las porciones. Coma solo una porcin por vez para asegurarse de [employer] niveles de FODMAP. Planificacin de las comidas  Siga un plan de alimentacin con bajo contenido de FODMAP durante un mximo de  6 semanas, o segn le indique el mdico o nutricionista.  Para seguir el plan de alimentacin, haga lo siguiente: 1. Elimine los alimentos con alto contenido de FODMAP de su dieta por completo. 2. Vuelva a introducir los  alimentos con alto contenido de FODMAP en su dieta gradualmente, de uno a la vez. [place] personas deben esperar [employer] despus de introducir un alimento con alto contenido de FODMAP antes de introducir el siguiente. Su nutricionista puede recomendarle con qu rapidez debera volver a introducir los alimentos. 3. Mantenga un registro diario de sus comidas y bebidas y recuerde los sntomas que tiene despus de comer. 4. Revise regularmente su registro diario con el nutricionista. El nutricionista puede ayudarlo a identificar qu alimentos puede comer y cules Child psychotherapist. Consejos generales  Beba suficiente lquido todos los das como para mantener la orina de color amarillo plido.  No consuma alimentos procesados. Con frecuencia, estos alimentos tienen azcar agregada y pueden tener un alto contenido de FODMAP.  Evite la [employer] de los productos lcteos, los cereales integrales y los endulzantes.  Consulte con un nutricionista para asegurarse de incluir suficiente fibra en su alimentacin. Alimentos recomendados [employer] sin gluten, como arroz, avena, trigo sarraceno, quinua, maz, polenta y mijo. Pasta, pan y cereal sin gluten. Fideos de Occupational psychologist. Tortillas de maz. Verduras  [place], calabacn, pepino, pimientos, judas verdes, repollitos de [place], brotes de soja, [place], [place], kale, acelga, espinaca, col berza, col [place], [place], papa y [place]. Cantidades limitadas de maz, zanahoria y camote. La parte verde de los cebollines. Frutas  Bananas, naranjas, limones, limas, arndanos, frambuesas, fresas, uvas, meln, meln dulce, kiwi, papaya, maracuy y pia. Cantidades limitadas de arndanos rojos disecados, chips de banana y coco rallado. Cottie Banda, yogur y English as a second language teacher sin Comptroller. Queso requesn y helado sin Comptroller. Leches que no sean de origen animal, por ejemplo, de almendras, coco, camo y arroz. Yogures hechos con [employer] no sea de origen  animal. Cantidades limitadas de queso de cabra, brie, [place], parmesano, suizo y [employer] duros. Carnes y otros alimentos ricos en protenas  Carne de res, cerdo, aves o pescado sin sazonar. Huevos. [place]. Tofu (firme) y tempeh. Cantidades limitadas de frutos secos y semillas, como almendras, nueces, nueces de [place], [place], [place], semillas de calabaza, semillas de chia y semillas de [place]. Grasas y aceites  Pastas para untar sin manteca. Aceites vegetales, como el de [place], de canola y de [place]. Condimentos y otros alimentos  Endulzantes artificiales con nombres que no terminen en "ol", como aspartamo, sacarina y stevia. Chrissie Noa de arce, azcar blanca, azcar sin refinar, azcar morena y melaza. [place], cilantro, perejil, romero y Coca Cola. Bebidas  Grayce Sessions y agua mineral. Refrescos endulzados con azcar. Pequeas cantidades de [place] de naranjas o jugo de arndanos rojos. T negro y verde. La mayora de los vinos secos. Caf. Es posible que esta lista de alimentos con bajo contenido de FODMAP no sea [place]. Hable con su nutricionista para obtener ms informacin. Alimentos que se deben evitar Cereales  Trigo, incluidos el [place], trigo duro y [place]. Cebada y bulgur. Cuscs. Cereales a base de trigo. Fideos de trigo, pan, galletas saladas y pasteles. Verduras  Raz de [place], alcachofa, esprrago, repollo, arvejas, guisantes, arvejas, hongos y Dance movement psychotherapist. Cebollas, ajo, puerros y la parte blanca de los cebollines. Frutas  Manzana, pera, sanda, durazno, ciruela, cereza, damasco, mora, mora de [place], higo, nectarina y Writer, en forma fresca, disecada o en jugo. [place]. [employer], yogur,  helado y queso blando. Crema y Tax inspector. Salsas a base de [place]. Natillas. Carnes y otros alimentos ricos en protenas  Carnes fritas o grasosas. Salchichas. Castaas de caj y pistachos. Soja, frijoles en salsa de tomate, frijoles negros, garbanzos, porotos colorados,  habas, porotos blancos, lentejas y arvejas partidas. Condimentos y otros alimentos  Cualquier goma de Higher education careers adviser o caramelo sin azcar. Alimentos que contienen endulzantes artificiales tales como el sorbitol, manitol, isomalt o xilitol. Alimentos que contienen miel, jarabe de maz de alta fructosa o agave. Consom, caldo de verduras, caldo de res y [place]. Ajo y cebolla en polvo. Condimentos hechos con cebolla, como el hummus, chutney, pickles, salsa de pepinillos, aderezo para ensaladas y [place]. Extracto de [place]. Bebidas  Bebidas a base de endivias. Sustitutos del caf. T de manzanilla. T de hinojo. Vinos dulces o enriquecidos como el oporto o el Industrial/product designer. Refrescos dietticos hechos con isomalt, manitol, maltitol, sorbitol o xilitol. Jugo de [place], pera y mango. Jugos con Chrissie Noa de maz de alta fructosa. Es posible que esta lista de alimentos con alto contenido de FODMAP no sea [place]. Hable con el nutricionista acerca de las mejores opciones alimenticias para usted.  Resumen  Un plan de alimentacin con bajo contenido de FODMAP es una dieta a corto plazo en la que se eliminan los FODMAP para ayudar a Public house manager los sntomas de ciertas enfermedades intestinales.  Generalmente, el plan de alimentacin dura hasta 6 semanas. Despus de ese perodo, gradualmente se vuelven a introducir los alimentos con alto contenido de FODMAP, de uno a la vez, para TEFL teacher cules pueden causar los sntomas.  Un plan de alimentacin con bajo contenido de FODMAP puede ser complicado. Es aconsejable consultar a un nutricionista con experiencia en este tipo de plan. Esta informacin no tiene Marine scientist el consejo del mdico. Asegrese de hacerle al mdico cualquier pregunta que tenga. Document Released: 05/10/2017 Document Revised: 05/10/2017 Document Reviewed: 05/10/2017 Elsevier Interactive Patient Education  2019 [place] abdominal en adultos Abdominal Pain, Adult El dolor  abdominal puede tener muchas causas. A menudo, no es grave y [place] sin tratamiento o con tratamiento en la casa. Sin embargo, a Product/process development scientist abdominal es intenso. El mdico revisar sus antecedentes mdicos y le har un examen fsico para tratar de Office manager causa del dolor abdominal. Siga estas instrucciones en su casa:  Tome los medicamentos de venta libre y los recetados solamente como se lo haya indicado el mdico. No tome un laxante a menos que se lo haya indicado el mdico.  Beba suficiente lquido para Theatre manager la orina clara o de color amarillo plido.  Controle su afeccin para ver si hay cambios.  Concurra a todas las visitas de control como se lo haya indicado el mdico. Esto es importante. Comunquese con un mdico si:  El dolor abdominal cambia o empeora.  No tiene apetito o baja de peso sin proponrselo.  Est estreido o tiene diarrea durante ms de 2 o 3das.  Tiene dolor cuando orina o defeca.  El dolor abdominal lo despierta de noche.  El dolor empeora con las comidas, despus de comer o con determinados alimentos.  Tiene vmitos y no puede retener nada.  Tiene fiebre. Solicite ayuda de inmediato si:  El dolor no desaparece tan pronto como el mdico le dijo que era esperable.  No puede detener los vmitos.  El Social research officer, government se siente solo en zonas del abdomen, como el lado derecho o la parte inferior izquierda del abdomen.  Las heces son sanguinolentas o de color negro, o de aspecto alquitranado.  Tiene dolor intenso, clicos, o meteorismo en el abdomen.  Tiene signos de deshidratacin, por ejemplo: ? Elmon Else, muy escasa o falta de [place]. ? Labios agrietados. ? [employer]. ? Ojos hundidos. ? Somnolencia. ? Debilidad. Esta informacin no tiene Marine scientist el consejo del mdico. Asegrese de hacerle al mdico cualquier pregunta que tenga. Document Released: 06/21/2005 Document Revised: 06/10/2016 Document Reviewed: 12/03/2015 Elsevier  Interactive Patient Education  2019 Elsevier Inc.  Gastritis - Adultos Gastritis, Adult La gastritis es la inflamacin del estmago. Hay dos tipos de gastritis:  Gastritis aguda. Este tipo aparece de manera repentina.  Gastritis crnica. Este tipo es mucho ms frecuente y Chief Technology Officer. La gastritis se manifiesta cuando el revestimiento del estmago se debilita o se lesiona. Sin tratamiento, la gastritis puede causar sangrado y lceras estomacales. Cules son las causas? Esta afeccin puede ser causada por lo siguiente:  Infeccin.  Beber alcohol en exceso.  Ciertos medicamentos. Estos incluyen corticoesteroides, antibiticos y algunos medicamentos de venta sin receta, como aspirina o ibuprofeno.  Hay demasiado cido en el estmago.  Una enfermedad del intestino o del estmago.  Estrs.  Una reaccin alrgica.  Enfermedad de [place].  Algunos tratamientos para el cncer (radiacin). A veces, se desconoce la causa de esta afeccin. Cules son los signos o los sntomas? Los sntomas de esta afeccin incluyen los siguientes:  Dolor o sensacin de ardor en la parte superior del abdomen.  Nuseas.  Vmitos.  Sensacin molesta de saciedad despus de comer.  Prdida de peso.  Mal aliento.  Sangre en el Cosmos. En algunos casos, no hay sntomas. Cmo se diagnostica? Esta afeccin puede diagnosticarse en funcin de lo siguiente:  Sus antecedentes mdicos y [place] descripcin de sus sntomas.  Un examen fsico.  Estudios. Estos pueden incluir los siguientes: ? Anlisis de [place]. ? Pruebas de heces. ? Un estudio en el que se introduce un instrumento delgado y flexible con Ardelia Mems luz y Ardelia Mems pequea cmara a travs del esfago hasta el estmago (endoscopa alta). ? Un estudio en el cual se toma una muestra de tejido para analizarlo (biopsia). Cmo se trata? Esta afeccin se puede tratar con medicamentos. Los medicamentos que se utilizan varan segn la  causa de la gastritis:  Si la afeccin se debe a una infeccin bacteriana, pueden recetarle medicamentos antibiticos.  Si la afeccin se debe al exceso de cido estomacal, se pueden administrar medicamentos denominados bloqueadores H2, inhibidores de la bomba de protones o anticidos. El tratamiento puede tambin incluir interrumpir el uso de ciertos medicamentos como aspirina, ibuprofeno u otros antiinflamatorios no esteroideos (AINE). Siga estas indicaciones en su casa: Medicamentos  [employer] de venta libre y los recetados solamente como se lo haya indicado el mdico.  Si le recetaron un antibitico, tmelo como se lo haya indicado el mdico. No deje de tomar el antibitico, aunque comience a Sports administrator. Comida y bebida   Haga comidas pequeas y frecuentes durante el da en lugar de comidas abundantes.  Evite los alimentos y las bebidas que intensifican los sntomas.  Beba suficiente lquido como para Theatre manager la orina de color amarillo plido. Consumo de alcohol  No beba alcohol si: ? Su mdico le indica no hacerlo. ? Est embarazada, puede estar embarazada o est tratando de quedar embarazada.  Si bebe alcohol: ? Limite su uso a las siguientes medidas:  De 0 a 1 medida por da para las  mujeres.  De 0 a 2 medidas por da para los hombres. ? Est atento a la cantidad de alcohol que hay en las bebidas que toma. En los [place], una medida equivale a una botella de cerveza de 12oz (348ml), un vaso de vino de 5oz (143ml) o un vaso de una bebida alcohlica de alta graduacin de 1oz (57ml). Indicaciones generales  Hable con el mdico [employer] formas de controlar el estrs, Aeronautical engineer ejercicio con regularidad o practicar respiracin profunda, meditacin o yoga.  No consuma ningn producto que contenga nicotina o tabaco, como cigarrillos y Psychologist, sport and exercise. Si necesita ayuda para dejar de fumar, consulte al mdico.  Concurra a todas las  visitas de seguimiento como se lo haya indicado el mdico. Esto es importante. Comunquese con un mdico si:  Sus sntomas empeoran.  Los sntomas regresan despus del tratamiento. Solicite ayuda inmediatamente si:  Vomita sangre de color rojo brillante o una sustancia similar a los granos de caf.  Las heces son negras o de color rojo oscuro.  No puede retener los lquidos.  El dolor abdominal empeora.  Tiene fiebre.  No se siente mejor luego de [employer]. Resumen  La gastritis es la inflamacin del revestimiento del estmago que puede ocurrir de Geographical information systems officer repentina [place]) o desarrollarse lentamente con el tiempo (crnica).  Esta afeccin se diagnostica mediante los antecedentes mdicos, un examen fsico o estudios.  Esta afeccin puede tratarse con medicamentos para tratar la infeccin o medicamentos para reducir la cantidad de Chartered loss adjuster.  Siga las indicaciones del mdico acerca de tomar medicamentos, hacer cambios en la dieta y saber cundo pedir ayuda. Esta informacin no tiene Marine scientist el consejo del mdico. Asegrese de hacerle al mdico cualquier pregunta que tenga. Document Released: 03/31/2005 Document Revised: 12/28/2017 Document Reviewed: 12/28/2017 Elsevier Interactive Patient Education  2019 [employer].

## 2018-08-17 NOTE — Progress Notes (Signed)
Subjective:    Patient ID: Isabella Robertson, female    DOB: 09/24/1965, 53 y.o.   MRN: [id number]  52y/o Hispanic female pt c/o central and left anterior chest wall pain that has occurred 3 times over the past 2 days. Pain was located in L chest and under L axilla. First two episodes woke her from sleep each of the past two nights. She has gotten up and walked around and the pain has resolved. She remained sore in the area which would progressively improve over the next couple of hours. She would be unable to move body 2/2 pain in the moment of worst pain described as sharp/stabbing by patient and 10/10, then as it lessens, she has pain only with moving left arm. This pain last occurred after walking into work this morning. The soreness is currently resolved. She has no associated sx when this pain occurs.   Now, new c/o that she did not have when RN evaluated earlier in the morning. Reporting L abdomen/side pain that wraps from L mid abdomen to L mid back x3 days. Also intermittent, but more persistent than chest wall pain. "I think it may be the colon." Denies n/v/d. No certain movements make pain worse, but all movement hurts. Wonders "if the medicine I take for my stomach isn't working anymore, if I've been taking it too long." Takes Omeprazole 20mg  po daily, and Sucralfate tablets if she has diarrhea or eating foods that her stomach is sensitive to, and Bentyl tablets when she feels bloated.  Patient reported stopped drinking cow's milk and eating certain foods fruits/vegetables that resulted in diarrhea.  Has seen GI in the past more than one and last colonoscopy 2018 told she had diverticulitis and she has had laparoscopy to remove intestinal/ovary adhesions/scarring in the past also  "most of my stomach problems worsened after that surgery"  Per Epic chart review history +H pylori tests and multiple treatments last with quad therapy.  Diarrhea has improved with omeprazole/bentyl/carafate and  watching what she eats changed from cow milk to soy milk.  She can now go out to eat or shopping and not have to be constantly aware of where a bathroom is in case of diarrhea attack.  Denied fever/chills/dysuria/reflux heartburn.  Her left upper abdomen has been feeling bloated.     Review of Systems  Constitutional: Negative for activity change, appetite change, chills, diaphoresis, fatigue and fever.  HENT: Negative for postnasal drip, rhinorrhea, sinus pressure, sinus pain, trouble swallowing and voice change.   Eyes: Negative for photophobia and visual disturbance.  Respiratory: Negative for cough, shortness of breath, wheezing and stridor.   Cardiovascular: Positive for chest pain. Negative for palpitations and leg swelling.  Gastrointestinal: Positive for abdominal distention, abdominal pain and diarrhea. Negative for anal bleeding, blood in stool, constipation, nausea and vomiting.  Endocrine: Negative for cold intolerance and heat intolerance.  Genitourinary: Positive for flank pain. Negative for decreased urine volume, difficulty urinating, dysuria, frequency, hematuria, menstrual problem, urgency, vaginal bleeding, vaginal discharge and vaginal pain.  Musculoskeletal: Positive for myalgias. Negative for arthralgias, back pain, gait problem, joint swelling, neck pain and neck stiffness.  Skin: Negative for color change, pallor, rash and wound.  Allergic/Immunologic: Positive for food allergies. Negative for environmental allergies.  Neurological: Negative for dizziness, tremors, seizures, syncope, facial asymmetry, speech difficulty, weakness, light-headedness, numbness and headaches.  Hematological: Negative for adenopathy. Does not bruise/bleed easily.  Psychiatric/Behavioral: Negative for agitation, confusion and sleep disturbance.  Objective:   Physical Exam Vitals signs and nursing note reviewed.  Constitutional:      General: She is awake. She is not in acute  distress.    Appearance: Normal appearance. She is well-developed and well-groomed. She is not ill-appearing, toxic-appearing or diaphoretic.  HENT:     Head: Normocephalic and atraumatic.     Jaw: There is normal jaw occlusion. No trismus.     Salivary Glands: Right salivary gland is not diffusely enlarged or tender. Left salivary gland is not diffusely enlarged or tender.     Right Ear: Hearing, ear canal and external ear normal. A middle ear effusion is present. There is no impacted cerumen.     Left Ear: Hearing, ear canal and external ear normal. A middle ear effusion is present. There is no impacted cerumen.     Nose: Mucosal edema present. No nasal deformity, septal deviation, laceration, nasal tenderness, congestion or rhinorrhea.     Right Turbinates: Not enlarged, swollen or pale.     Left Turbinates: Not enlarged, swollen or pale.     Right Sinus: No maxillary sinus tenderness or frontal sinus tenderness.     Left Sinus: No maxillary sinus tenderness or frontal sinus tenderness.     Mouth/Throat:     Lips: Pink. No lesions.     Mouth: Mucous membranes are moist. Mucous membranes are not pale, not dry and not cyanotic. No lacerations, oral lesions or angioedema.     Dentition: Normal dentition. Does not have dentures. No dental caries or dental abscesses.     Tongue: No lesions.     Pharynx: Uvula midline. Pharyngeal swelling and posterior oropharyngeal erythema present. No oropharyngeal exudate or uvula swelling.     Tonsils: No tonsillar exudate or tonsillar abscesses. Swelling: 0 on the right. 0 on the left.     Comments: Cobblestoning posterior pharynx; bilateral allergic shiners; TMs air fluid level clear;  Eyes:     General: Lids are normal. Allergic shiner present. No visual field deficit or scleral icterus.       Right eye: No foreign body, discharge or hordeolum.        Left eye: No foreign body, discharge or hordeolum.     Extraocular Movements:     Right eye: Normal  extraocular motion and no nystagmus.     Left eye: Normal extraocular motion and no nystagmus.     Conjunctiva/sclera: Conjunctivae normal.     Right eye: Right conjunctiva is not injected. No chemosis, exudate or hemorrhage.    Left eye: Left conjunctiva is not injected. No chemosis, exudate or hemorrhage.    Pupils: Pupils are equal, round, and reactive to light. Pupils are equal.     Right eye: Pupil is round and reactive.     Left eye: Pupil is round and reactive.  Neck:     Musculoskeletal: Normal range of motion and neck supple. Normal range of motion. No edema, erythema, neck rigidity, crepitus, pain with movement, torticollis or muscular tenderness.     Thyroid: No thyroid mass or thyromegaly.     Trachea: Trachea and phonation normal. No tracheal tenderness or tracheal deviation.  Cardiovascular:     Rate and Rhythm: Normal rate and regular rhythm.     Chest Wall: PMI is not displaced.     Pulses:          Radial pulses are 2+ on the right side and 2+ on the left side.     Heart sounds: Normal heart sounds,  S1 normal and S2 normal. No murmur. No friction rub. No gallop.   Pulmonary:     Effort: Pulmonary effort is normal. No accessory muscle usage or respiratory distress.     Breath sounds: Normal breath sounds and air entry. No stridor, decreased air movement or transmitted upper airway sounds. No decreased breath sounds, wheezing, rhonchi or rales.     Comments: No cough observed in exam room; spoke full sentences without difficulty Chest:     Chest wall: No tenderness.  Abdominal:     General: Abdomen is flat. Bowel sounds are decreased. There is no distension.     Palpations: Abdomen is soft. There is no shifting dullness, fluid wave, hepatomegaly, splenomegaly, mass or pulsatile mass.     Tenderness: There is abdominal tenderness in the epigastric area and left upper quadrant. There is no right CVA tenderness, left CVA tenderness, guarding or rebound. Negative signs include  Murphy's sign and McBurney's sign.     Hernia: No hernia is present.       Comments: Exquisite epigastric TTP; mildly TTP LUQ; hypoactive bowel sounds x 4 quads and dull to percussion x 4 quads  Musculoskeletal: Normal range of motion.        General: No swelling or tenderness.     Right shoulder: Normal.     Left shoulder: Normal.     Right elbow: Normal.    Left elbow: Normal.     Right hip: Normal.     Left hip: Normal.     Right knee: Normal.     Left knee: Normal.     Cervical back: Normal.     Thoracic back: Normal.     Lumbar back: She exhibits pain. She exhibits normal range of motion, no tenderness, no bony tenderness, no swelling, no edema, no deformity, no laceration, no spasm and normal pulse.       Back:     Right hand: Normal.     Left hand: Normal.     Right lower leg: No edema.     Left lower leg: No edema.  Lymphadenopathy:     Head:     Right side of head: No submental, submandibular, tonsillar, preauricular, posterior auricular or occipital adenopathy.     Left side of head: No submental, submandibular, tonsillar, preauricular, posterior auricular or occipital adenopathy.     Cervical: No cervical adenopathy.     Right cervical: No superficial, deep or posterior cervical adenopathy.    Left cervical: No superficial, deep or posterior cervical adenopathy.  Skin:    General: Skin is warm and dry.     Capillary Refill: Capillary refill takes less than 2 seconds.     Coloration: Skin is not ashen, cyanotic, jaundiced, mottled, pale or sallow.     Findings: No abrasion, abscess, acne, bruising, burn, ecchymosis, erythema, laceration, lesion, petechiae, rash or wound.     Nails: There is no clubbing.   Neurological:     General: No focal deficit present.     Mental Status: She is alert and oriented to person, place, and time. Mental status is at baseline. She is not disoriented.     GCS: GCS eye subscore is 4. GCS verbal subscore is 5. GCS motor subscore is 6.      Cranial Nerves: Cranial nerves are intact. No cranial nerve deficit, dysarthria or facial asymmetry.     Sensory: Sensation is intact. No sensory deficit.     Motor: Motor function is intact. No weakness, tremor, atrophy,  abnormal muscle tone or seizure activity.     Coordination: Coordination is intact. Coordination normal.     Gait: Gait is intact. Gait normal.     Comments: Gait sure and steady in hallway; in/out of chair without difficulty  Psychiatric:        Attention and Perception: Attention and perception normal.        Mood and Affect: Mood and affect normal.        Speech: Speech normal.        Behavior: Behavior normal. Behavior is cooperative.        Thought Content: Thought content normal.        Cognition and Memory: Cognition and memory normal.        Judgment: Judgment normal.           Assessment & Plan:  A-epigastric and abdomen pain acute  P-colonoscopy/CT showed diverticuli 2018; CT 2015 left inguinal hernia small fat only; PSHx hysterectomyand treated for positive H pylori testing multiple times in the past on omeprazole will increase from 20mg  po daily to 20mg  po BID or 40mg  po daily with food per patient preference x 2 weeks.  Reviewed up to date guidelines and treatment for h pylori and diverticulitis antibiotics do not have cross coverage and instead of risking c difficile infection requested patient to schedule with GI Dr Gustavo Lah [facility] for re-evaluation and possible H pylori testing/imaging.  Patient assisted with appt scheduling tomorrow at 0900 by RN Hildred Alamin.  Bland diet; avoid trigger foods.  If repetitive vomiting, unable to void every 8 hours, worsening abdomen pain/hematuria/hematochesis/hematemesis, worsening chest pain radiating to left arm/jaw/dyspnea, "feels like elephant on her chest", diaphoresis patient to seek follow up re-evaluation today PCM/UC/ER.  Discussed with patient I think she has ulcer could be reoccurrence of h pylori  infection in addition to IBS flare and/or diverticulitis flare.  [facility] handouts printed and given in spanish per patient preference on diverticulitis, h pylori, abdomen pain and fodmap diet.  Patient verbalized understanding information/instructions, agreed with plan of care and had no further questions at this time.

## 2018-08-18 ENCOUNTER — Other Ambulatory Visit: Payer: Self-pay | Admitting: Gastroenterology

## 2018-08-18 ENCOUNTER — Other Ambulatory Visit (HOSPITAL_COMMUNITY): Payer: Self-pay | Admitting: Gastroenterology

## 2018-08-18 ENCOUNTER — Ambulatory Visit
Admission: RE | Admit: 2018-08-18 | Discharge: 2018-08-18 | Disposition: A | Discharge status: Home / Self Care | Payer: No Typology Code available for payment source | Source: Ambulatory Visit | Attending: Gastroenterology | Admitting: Gastroenterology

## 2018-08-18 DIAGNOSIS — R1032 Left lower quadrant pain: Secondary | ICD-10-CM

## 2018-08-18 HISTORY — DX: Unspecified asthma, uncomplicated: J45.909

## 2018-08-18 MED ORDER — IOPAMIDOL (ISOVUE-300) INJECTION 61%
100.0000 mL | Freq: Once | INTRAVENOUS | Status: AC | PRN
  Administered 2018-08-18: 100 mL via INTRAVENOUS

## 2018-08-19 ENCOUNTER — Telehealth: Payer: Self-pay | Admitting: Registered Nurse

## 2018-08-19 NOTE — Telephone Encounter (Signed)
Reviewed Epic patient saw Gastroenterology [facility] 08/18/2018 negative abdomen CT and labs CBC/BMET/lipase. Cardiology referral routine and given ER precautions if chest pain returned.  Omeprazole DR increased by gastroenterology to 40mg  po BID please check with patient when she will run out of current Rx at new dosing so we can fill for her.

## 2018-09-28 ENCOUNTER — Telehealth: Payer: Self-pay | Admitting: Registered Nurse

## 2018-09-28 ENCOUNTER — Other Ambulatory Visit: Payer: Self-pay | Admitting: Registered Nurse

## 2018-09-28 ENCOUNTER — Encounter: Payer: Self-pay | Admitting: Registered Nurse

## 2018-09-28 MED ORDER — FLUTICASONE PROPIONATE 50 MCG/ACT NA SUSP: 1.0000 | Freq: Two times a day (BID) | NASAL | 6 refills | Status: DC

## 2018-09-28 MED ORDER — SALINE SPRAY 0.65 % NA SOLN: 2.0000 | NASAL | 0 refills | Status: DC

## 2018-09-28 NOTE — Telephone Encounter (Addendum)
Patient also requested refill on her flonase nasal spray 27mcg 1 spray each nostril BID electronic Rx sent to her pharmacy of choice and she also received 1 bottle nasal saline from clinic stock 2 sprays each nostril q2h wa.  Seasonal allergy flare noted by patient.  Patient also stated since she has started omeprazole 40mg  po BID her chest pains have decreased.  Only one episode this week milder than previous.  She cancelled her cardiology appt recommended by GI since symptoms improving with omeprazole dose increase by GI and watching her diet.  Discussed with patient to reschedule cardiology appt if worsening symptoms chest eg short of breath/pain/dyspnea/jaw/left arm pain.  Patient verbalized understanding information/instructions, agreed with plan of care and had no further questions at this time.

## 2018-09-28 NOTE — Telephone Encounter (Signed)
Patient requested refill omeprazole as dose increased at GI last month and going to run out soon.  Dispensed from P[facility] Omeprazole 40mg  DR po BID 30 minutes prior to meal #180 RF0.  Patient also noted after eating pork sandwich premade sold at work she noticed lip swelling after she ate sandwich but has decreased and some cheek swelling.  History of food allergies last time she also developed rash on left arm but none today.  Patient instructed to take benadryl 25-50mg  po q8h OTC prn and avoid further pork products today follow up if worsening symptoms e.g. tongue swelling, dyspnea, wheezing, sob, dysphagia.  Patient voice strong no coughing heard, patient denied nausea/vomiting/diarrhea.  Patient verbalized understanding information/instructions, agreed with plan of care and had no further questions at this time.

## 2018-10-20 ENCOUNTER — Other Ambulatory Visit: Payer: Self-pay | Admitting: Family Medicine

## 2018-10-20 NOTE — Telephone Encounter (Signed)
Please advise on below, per protocol can not approve.

## 2018-10-23 MED ORDER — LORATADINE 10 MG PO TABS: 10.0000 mg | ORAL_TABLET | Freq: Every day | ORAL | 2 refills | Status: DC

## 2018-10-23 MED ORDER — ALBUTEROL SULFATE HFA 108 (90 BASE) MCG/ACT IN AERS: 1.0000 | INHALATION_SPRAY | RESPIRATORY_TRACT | 0 refills | Status: DC | PRN

## 2018-10-23 MED ORDER — FLUTICASONE PROPIONATE 50 MCG/ACT NA SUSP: 1.0000 | Freq: Two times a day (BID) | NASAL | 0 refills | Status: DC

## 2018-10-25 ENCOUNTER — Other Ambulatory Visit: Payer: Self-pay | Admitting: Family Medicine

## 2018-10-27 ENCOUNTER — Other Ambulatory Visit: Payer: Self-pay | Admitting: Family Medicine

## 2018-12-12 ENCOUNTER — Other Ambulatory Visit: Payer: Self-pay | Admitting: *Deleted

## 2018-12-12 DIAGNOSIS — K588 Other irritable bowel syndrome: Secondary | ICD-10-CM

## 2018-12-12 DIAGNOSIS — K580 Irritable bowel syndrome with diarrhea: Secondary | ICD-10-CM

## 2018-12-12 MED ORDER — DICYCLOMINE HCL 20 MG PO TABS: 20.0000 mg | ORAL_TABLET | Freq: Three times a day (TID) | ORAL | 0 refills | Status: DC

## 2018-12-12 NOTE — Telephone Encounter (Signed)
Pt reports she does not need Omeprazole refilled. RN had sent email asking if she was still taking this BID. She reports taking it daily usually. Has #90 bottle left still from 3/36/20 fill. She reports when she has a pain in her chest that NP and GI believe to be reflux related, she has found that taking an allergy med helps resolve her sx.  She is requesting Bentyl refill. Last filled Dec 2018 by GI. Takes prn. Advised her small bridge refill will be placed but hat she needs to contact GI for further refills. She verbalizes understanding and agreement. Preferred pharmacy confirmed.

## 2019-01-05 ENCOUNTER — Other Ambulatory Visit: Payer: Self-pay | Admitting: Registered Nurse

## 2019-01-05 DIAGNOSIS — K580 Irritable bowel syndrome with diarrhea: Secondary | ICD-10-CM

## 2019-01-05 DIAGNOSIS — K588 Other irritable bowel syndrome: Secondary | ICD-10-CM

## 2019-01-09 ENCOUNTER — Encounter: Payer: Self-pay | Admitting: Registered Nurse

## 2019-01-09 NOTE — Telephone Encounter (Signed)
Please verify with patient if she does require bentyl refill and if problems contacting GI provider office as last bridge refill patient notified further refills must be obtained from her GI provider but due to covid pandemic their office closed and [facility] refilled her.

## 2019-01-11 NOTE — Telephone Encounter (Signed)
RN Hildred Alamin has attempted to reach patient via telephone and work email to verify she is requesting refill.  RN Hildred Alamin left message to contact clinic staff.

## 2019-01-16 ENCOUNTER — Other Ambulatory Visit: Payer: Self-pay | Admitting: Family Medicine

## 2019-01-18 NOTE — Telephone Encounter (Signed)
Pt still has not responded to overhead pages, phone call, email. Email stated to reach out to clinic if she actually needed this refill vs being an autorefill request. Reminded pt that future refills would need to be completed by GI as they are original prescriber and she needs to f/u with them if she is still having sx.

## 2019-04-19 LAB — HM MAMMOGRAPHY

## 2019-05-15 ENCOUNTER — Ambulatory Visit: Payer: Self-pay | Admitting: Registered Nurse

## 2019-05-15 ENCOUNTER — Encounter: Payer: Self-pay | Admitting: Registered Nurse

## 2019-05-15 ENCOUNTER — Other Ambulatory Visit: Payer: Self-pay

## 2019-05-15 DIAGNOSIS — H6982 Other specified disorders of Eustachian tube, left ear: Secondary | ICD-10-CM

## 2019-05-15 DIAGNOSIS — H8112 Benign paroxysmal vertigo, left ear: Secondary | ICD-10-CM

## 2019-05-15 DIAGNOSIS — J019 Acute sinusitis, unspecified: Secondary | ICD-10-CM

## 2019-05-15 DIAGNOSIS — H6992 Unspecified Eustachian tube disorder, left ear: Secondary | ICD-10-CM

## 2019-05-15 MED ORDER — MONTELUKAST SODIUM 10 MG PO TABS: 10.0000 mg | ORAL_TABLET | Freq: Every day | ORAL | 3 refills | Status: DC

## 2019-05-15 MED ORDER — SALINE SPRAY 0.65 % NA SOLN: 2.0000 | NASAL | 0 refills | Status: DC

## 2019-05-15 MED ORDER — MECLIZINE HCL 25 MG PO TABS: 25.0000 mg | ORAL_TABLET | Freq: Four times a day (QID) | ORAL | 0 refills | Status: AC | PRN

## 2019-05-15 NOTE — Progress Notes (Signed)
Subjective:    Patient ID: Isabella Robertson, female    DOB: October 18, 1965, 53 y.o.   MRN: [id number]  53y/o hispanic female established patient here for left ear clogged/can't pop it, pressure behind left eye/forehead.  Stopped allergy medications after spring flare.  Sometimes getting dizzy.  Symptoms worse with car windows down muffled hearing.  "Feels like when I fly and cannot clear ears"  PMHx meclizine 2018 doesn't have any left at home; last amoxicillin 2017 per paper chart review [facility].  Saw RN Hildred Alamin yesterday and was given phenylephrine and nasal saline and told to restart flonase.  Patient not taking singulair.  Denied fever/chills/ear discharge, cough, post nasal drip, rhinitis.     Review of Systems  Constitutional: Negative for activity change, appetite change, chills, diaphoresis, fatigue and fever.  HENT: Positive for ear pain, hearing loss, sinus pressure and sinus pain. Negative for congestion, dental problem, drooling, ear discharge, facial swelling, mouth sores, nosebleeds, postnasal drip, rhinorrhea, sneezing, sore throat, trouble swallowing and voice change.   Eyes: Negative for photophobia, pain, discharge, redness, itching and visual disturbance.  Respiratory: Negative for cough, choking, shortness of breath, wheezing and stridor.   Cardiovascular: Negative for chest pain.  Gastrointestinal: Negative for abdominal pain, diarrhea, nausea and vomiting.  Endocrine: Negative for cold intolerance and heat intolerance.  Genitourinary: Negative for difficulty urinating.  Musculoskeletal: Negative for back pain, gait problem, joint swelling, myalgias, neck pain and neck stiffness.  Skin: Negative for color change, pallor, rash and wound.  Allergic/Immunologic: Positive for environmental allergies and food allergies.  Neurological: Positive for dizziness. Negative for tremors, seizures, syncope, facial asymmetry, speech difficulty, weakness, light-headedness,  numbness and headaches.  Hematological: Negative for adenopathy. Does not bruise/bleed easily.  Psychiatric/Behavioral: Negative for agitation, confusion and sleep disturbance.       Objective:   Physical Exam Vitals signs and nursing note reviewed.  Constitutional:      General: She is awake. She is not in acute distress.    Appearance: Normal appearance. She is well-developed and well-groomed. She is not ill-appearing, toxic-appearing or diaphoretic.  HENT:     Head: Normocephalic and atraumatic.     Jaw: There is normal jaw occlusion. No trismus.     Salivary Glands: Right salivary gland is not diffusely enlarged or tender. Left salivary gland is not diffusely enlarged or tender.     Right Ear: Hearing, ear canal and external ear normal. No decreased hearing noted. A middle ear effusion is present. There is no impacted cerumen.     Left Ear: Hearing, ear canal and external ear normal. No decreased hearing noted. A middle ear effusion is present. There is no impacted cerumen.     Nose: Mucosal edema present. No nasal deformity, septal deviation, signs of injury, laceration, nasal tenderness, congestion or rhinorrhea.     Right Turbinates: Not enlarged, swollen or pale.     Left Turbinates: Not enlarged, swollen or pale.     Right Sinus: No maxillary sinus tenderness or frontal sinus tenderness.     Left Sinus: No maxillary sinus tenderness or frontal sinus tenderness.     Mouth/Throat:     Mouth: Mucous membranes are moist. Mucous membranes are not pale, not dry and not cyanotic. No injury, lacerations or oral lesions.     Dentition: Normal dentition. Does not have dentures. No gingival swelling, dental caries or dental abscesses.     Tongue: No lesions. Tongue does not deviate from midline.     Palate:  No mass and lesions.     Pharynx: Uvula midline. Pharyngeal swelling and posterior oropharyngeal erythema present. No oropharyngeal exudate or uvula swelling.     Tonsils: No tonsillar  exudate or tonsillar abscesses. 0 on the right. 0 on the left.     Comments: Cobblestoning posterior pharynx/ bilateral TMs air fluid level clear; bilateral allergic shiners; clear discharge bilateral nasal turbinates Eyes:     General: Lids are normal. Vision grossly intact. Gaze aligned appropriately. Allergic shiner present. No visual field deficit or scleral icterus.       Right eye: No foreign body, discharge or hordeolum.        Left eye: No foreign body, discharge or hordeolum.     Extraocular Movements: Extraocular movements intact.     Right eye: Normal extraocular motion and no nystagmus.     Left eye: Normal extraocular motion and no nystagmus.     Conjunctiva/sclera: Conjunctivae normal.     Right eye: Right conjunctiva is not injected. No chemosis, exudate or hemorrhage.    Left eye: Left conjunctiva is not injected. No chemosis, exudate or hemorrhage.    Pupils: Pupils are equal, round, and reactive to light. Pupils are equal.     Right eye: Pupil is round and reactive.     Left eye: Pupil is round and reactive.  Neck:     Musculoskeletal: Normal range of motion and neck supple. Normal range of motion. No edema, erythema, neck rigidity, crepitus, injury, pain with movement, torticollis, spinous process tenderness or muscular tenderness.     Thyroid: No thyroid mass or thyromegaly.     Trachea: Trachea normal. No tracheal tenderness or tracheal deviation.  Cardiovascular:     Rate and Rhythm: Normal rate and regular rhythm.     Chest Wall: PMI is not displaced.     Pulses: Normal pulses.          Radial pulses are 2+ on the right side and 2+ on the left side.     Heart sounds: Normal heart sounds, S1 normal and S2 normal. No murmur. No friction rub. No gallop.   Pulmonary:     Effort: Pulmonary effort is normal. No accessory muscle usage or respiratory distress.     Breath sounds: Normal breath sounds and air entry. No stridor, decreased air movement or transmitted upper  airway sounds. No decreased breath sounds, wheezing, rhonchi or rales.     Comments: Wearing cloth mask due to covid 19 pandemic; spoke full sentences without difficulty; no cough observed in exam room Chest:     Chest wall: No tenderness.  Abdominal:     General: Abdomen is flat. There is no distension.     Palpations: Abdomen is soft.  Musculoskeletal: Normal range of motion.        General: No tenderness.     Right shoulder: Normal.     Left shoulder: Normal.     Right elbow: Normal.    Left elbow: Normal.     Right hip: Normal.     Left hip: Normal.     Right knee: Normal.     Left knee: Normal.     Cervical back: Normal.     Thoracic back: Normal.     Lumbar back: Normal.     Right hand: Normal.     Left hand: Normal.  Lymphadenopathy:     Head:     Right side of head: No submental, submandibular, tonsillar, preauricular, posterior auricular or occipital adenopathy.  Left side of head: No submental, submandibular, tonsillar, preauricular, posterior auricular or occipital adenopathy.     Cervical: No cervical adenopathy.     Right cervical: No superficial, deep or posterior cervical adenopathy.    Left cervical: No superficial, deep or posterior cervical adenopathy.  Skin:    General: Skin is warm and dry.     Capillary Refill: Capillary refill takes less than 2 seconds.     Coloration: Skin is not ashen, cyanotic, jaundiced, mottled, pale or sallow.     Findings: No abrasion, abscess, acne, bruising, burn, ecchymosis, erythema, signs of injury, laceration, lesion, petechiae, rash or wound.     Nails: There is no clubbing.   Neurological:     General: No focal deficit present.     Mental Status: She is alert and oriented to person, place, and time. Mental status is at baseline. She is not disoriented.     GCS: GCS eye subscore is 4. GCS verbal subscore is 5. GCS motor subscore is 6.     Cranial Nerves: Cranial nerves are intact. No cranial nerve deficit, dysarthria or  facial asymmetry.     Sensory: Sensation is intact. No sensory deficit.     Motor: Motor function is intact. No weakness, tremor, atrophy, abnormal muscle tone or seizure activity.     Coordination: Coordination is intact. Coordination normal.     Gait: Gait is intact. Gait normal.  Psychiatric:        Attention and Perception: Attention and perception normal.        Mood and Affect: Mood and affect normal.        Speech: Speech normal.        Behavior: Behavior normal. Behavior is cooperative.        Thought Content: Thought content normal.        Cognition and Memory: Cognition and memory normal.        Judgment: Judgment normal.           Assessment & Plan:  A-acute rhinosinusitis, eustachian tube dysfunction left, benign positional vertigo  P-Patient may use normal saline nasal spray 2 sprays each nostril q2h wa as needed given bottle from clinic stock yesterday by RN Hildred Alamin. flonase 55mcg 1 spray each nostril BID at home.  Patient denied personal or family history of ENT cancer.  OTC antihistamine of choice claritin/zyrtec 10mg  po daily.  Restart singulair 10mg  po qhs #30 RF3 electronic Rx sent to her pharmacy of choice.  Discussed if no improvement with 48 hours of these medications follow up for re-eval especially if ear discharge/fever/chills/teeth pain/worsening ear/scalp as may need to start antibiotics.   Avoid triggers if possible.  Shower prior to bedtime if exposed to triggers.  If allergic dust/dust mites recommend mattress/pillow covers/encasements; washing linens, vacuuming, sweeping, dusting weekly.  Call or return to clinic as needed if these symptoms worsen or fail to improve as anticipated.   Exitcare handout on sinusitis and sinus rinse given to patient.  Patient verbalized understanding of instructions, agreed with plan of care and had no further questions at this time.  P2:  Avoidance and hand washing.  Discussed with patient otitis media with effusion probably  causing vertigo but could also be age. Meclizine 25mg  po prn helpful in the past last Rx 2018 per chart review. Supportive treatment may take up to 4 doses meclizine per day max 100mg  per 24 hours. Discussed signs/symptoms stroke. Electronic Rx meclizine 25mg  po qid prn #30 RF0 sent to her pharmacy of  choice.  Someone to drive her home recommended not driving during vertigo episodes. Follow up if aphasia, dysphasia, visual changes, weakness, fall, worst headache of life, incoordination, fever, ear discharge. Consider ENT evaluation/follow up with PCM if worsening symptoms not controlled with meclizine.  Exitcare handout on vertigo.   Patient verbalized understanding of information/agreed with plan of care and had no further questions at this time.    Restart singulair 10mg  po qhs electronic Rx #30 RF3 sent to her pharmacy of choice refill.  No evidence of invasive bacterial infection, non toxic and well hydrated.  I do not see where any further testing or imaging is necessary at this time.   I will suggest supportive care, rest, good hygiene and encourage the patient to take adequate fluids.  The patient is to return to clinic or EMERGENCY ROOM if symptoms worsen or change significantly e.g. ear pain, fever, purulent discharge from ears or bleeding.  Exitcare handout  eustachian tube dysfunction.  Discussed with patient post nasal drip irritates throat/causes swelling blocks eustachian tubes from draining and fluid fills up middle ear.  Bacteria/viruses can grow in fluid and with moving head tube compressed and increases pressure in tube/ear worsening pain.  Studies show will take 30 days for fluid to resolve after post nasal drip controlled with nasal steroid/antihistamine. Antibiotics and steroids do not speed up fluid removal.  Patient verbalized agreement and understanding of treatment plan and had no further questions at this time.

## 2019-05-15 NOTE — Patient Instructions (Signed)
Benign Positional Vertigo Vertigo is the feeling that you or your surroundings are moving when they are not. Benign positional vertigo is the most common form of vertigo. This is usually a harmless condition (benign). This condition is positional. This means that symptoms are triggered by certain movements and positions. This condition can be dangerous if it occurs while you are doing something that could cause harm to you or others. This includes activities such as driving or operating machinery. What are the causes? In many cases, the cause of this condition is not known. It may be caused by a disturbance in an area of the inner ear that helps your brain to sense movement and balance. This disturbance can be caused by:  Viral infection (labyrinthitis).  Head injury.  Repetitive motion, such as jumping, dancing, or running. What increases the risk? You are more likely to develop this condition if:  You are a woman.  You are 50 years of age or older. What are the signs or symptoms? Symptoms of this condition usually happen when you move your head or your eyes in different directions. Symptoms may start suddenly, and usually last for less than a minute. They include:  Loss of balance and falling.  Feeling like you are spinning or moving.  Feeling like your surroundings are spinning or moving.  Nausea and vomiting.  Blurred vision.  Dizziness.  Involuntary eye movement (nystagmus). Symptoms can be mild and cause only minor problems, or they can be severe and interfere with daily life. Episodes of benign positional vertigo may return (recur) over time. Symptoms may improve over time. How is this diagnosed? This condition may be diagnosed based on:  Your medical history.  Physical exam of the head, neck, and ears.  Tests, such as: ? MRI. ? CT scan. ? Eye movement tests. Your health care provider may ask you to change positions quickly while he or she watches you for symptoms  of benign positional vertigo, such as nystagmus. Eye movement may be tested with a variety of exams that are designed to evaluate or stimulate vertigo. ? An electroencephalogram (EEG). This records electrical activity in your brain. ? Hearing tests. You may be referred to a health care provider who specializes in ear, nose, and throat (ENT) problems (otolaryngologist) or a provider who specializes in disorders of the nervous system (neurologist). How is this treated?  This condition may be treated in a session in which your health care provider moves your head in specific positions to adjust your inner ear back to normal. Treatment for this condition may take several sessions. Surgery may be needed in severe cases, but this is rare. In some cases, benign positional vertigo may resolve on its own in 2-4 weeks. Follow these instructions at home: Safety  Move slowly. Avoid sudden body or head movements or certain positions, as told by your health care provider.  Avoid driving until your health care provider says it is safe for you to do so.  Avoid operating heavy machinery until your health care provider says it is safe for you to do so.  Avoid doing any tasks that would be dangerous to you or others if vertigo occurs.  If you have trouble walking or keeping your balance, try using a cane for stability. If you feel dizzy or unstable, sit down right away.  Return to your normal activities as told by your health care provider. Ask your health care provider what activities are safe for you. General instructions  Take over-the-counter   and prescription medicines only as told by your health care provider.  Drink enough fluid to keep your urine pale yellow.  Keep all follow-up visits as told by your health care provider. This is important. Contact a health care provider if:  You have a fever.  Your condition gets worse or you develop new symptoms.  Your family or friends notice any  behavioral changes.  You have nausea or vomiting that gets worse.  You have numbness or a "pins and needles" sensation. Get help right away if you:  Have difficulty speaking or moving.  Are always dizzy.  Faint.  Develop severe headaches.  Have weakness in your legs or arms.  Have changes in your hearing or vision.  Develop a stiff neck.  Develop sensitivity to light. Summary  Vertigo is the feeling that you or your surroundings are moving when they are not. Benign positional vertigo is the most common form of vertigo.  The cause of this condition is not known. It may be caused by a disturbance in an area of the inner ear that helps your brain to sense movement and balance.  Symptoms include loss of balance and falling, feeling that you or your surroundings are moving, nausea and vomiting, and blurred vision.  This condition can be diagnosed based on symptoms, physical exam, and other tests, such as MRI, CT scan, eye movement tests, and hearing tests.  Follow safety instructions as told by your health care provider. You will also be told when to contact your health care provider in case of problems. This information is not intended to replace advice given to you by your health care provider. Make sure you discuss any questions you have with your health care provider. Document Released: 03/29/2006 Document Revised: 11/30/2017 Document Reviewed: 11/30/2017 Elsevier Patient Education  [place]. Eustachian Tube Dysfunction  Eustachian tube dysfunction refers to a condition in which a blockage develops in the narrow passage that connects the middle ear to the back of the nose (eustachian tube). The eustachian tube regulates air pressure in the middle ear by letting air move between the ear and nose. It also helps to drain fluid from the middle ear space. Eustachian tube dysfunction can affect one or both ears. When the eustachian tube does not function properly, air  pressure, fluid, or both can build up in the middle ear. What are the causes? This condition occurs when the eustachian tube becomes blocked or cannot open normally. Common causes of this condition include:  Ear infections.  Colds and other infections that affect the nose, mouth, and throat (upper respiratory tract).  Allergies.  Irritation from cigarette smoke.  Irritation from stomach acid coming up into the esophagus (gastroesophageal reflux). The esophagus is the tube that carries food from the mouth to the stomach.  Sudden changes in air pressure, such as from descending in an airplane or scuba diving.  Abnormal growths in the nose or throat, such as: ? Growths that line the nose (nasal polyps). ? Abnormal growth of cells (tumors). ? Enlarged tissue at the back of the throat (adenoids). What increases the risk? You are more likely to develop this condition if:  You smoke.  You are overweight.  You are a child who has: ? Certain birth defects of the mouth, such as cleft palate. ? Large tonsils or adenoids. What are the signs or symptoms? Common symptoms of this condition include:  A feeling of fullness in the ear.  Ear pain.  Clicking or popping noises  in the ear.  Ringing in the ear.  Hearing loss.  Loss of balance.  Dizziness. Symptoms may get worse when the air pressure around you changes, such as when you travel to an area of high elevation, fly on an airplane, or go scuba diving. How is this diagnosed? This condition may be diagnosed based on:  Your symptoms.  A physical exam of your ears, nose, and throat.  Tests, such as those that measure: ? The movement of your eardrum (tympanogram). ? Your hearing (audiometry). How is this treated? Treatment depends on the cause and severity of your condition.  In mild cases, you may relieve your symptoms by moving air into your ears. This is called "popping the ears."  In more severe cases, or if you have  symptoms of fluid in your ears, treatment may include: ? Medicines to relieve congestion (decongestants). ? Medicines that treat allergies (antihistamines). ? Nasal sprays or ear drops that contain medicines that reduce swelling (steroids). ? A procedure to drain the fluid in your eardrum (myringotomy). In this procedure, a small tube is placed in the eardrum to:  Drain the fluid.  Restore the air in the middle ear space. ? A procedure to insert a balloon device through the nose to inflate the opening of the eustachian tube (balloon dilation). Follow these instructions at home: Lifestyle  Do not do any of the following until your health care provider approves: ? Travel to high altitudes. ? Fly in airplanes. ? Work in a [employer] scheme manager or room. ? Scuba dive.  Do not use any products that contain nicotine or tobacco, such as cigarettes and e-cigarettes. If you need help quitting, ask your health care provider.  Keep your ears dry. Wear fitted earplugs during showering and bathing. Dry your ears completely after. General instructions  Take over-the-counter and prescription medicines only as told by your health care provider.  Use techniques to help pop your ears as recommended by your health care provider. These may include: ? Chewing gum. ? Yawning. ? Frequent, forceful swallowing. ? Closing your mouth, holding your nose closed, and gently blowing as if you are trying to blow air out of your nose.  Keep all follow-up visits as told by your health care provider. This is important. Contact a health care provider if:  Your symptoms do not go away after treatment.  Your symptoms come back after treatment.  You are unable to pop your ears.  You have: ? A fever. ? Pain in your ear. ? Pain in your head or neck. ? Fluid draining from your ear.  Your hearing suddenly changes.  You become very dizzy.  You lose your balance. Summary  Eustachian tube dysfunction refers to a  condition in which a blockage develops in the eustachian tube.  It can be caused by ear infections, allergies, inhaled irritants, or abnormal growths in the nose or throat.  Symptoms include ear pain, hearing loss, or ringing in the ears.  Mild cases are treated with maneuvers to unblock the ears, such as yawning or ear popping.  Severe cases are treated with medicines. Surgery may also be done (rare). This information is not intended to replace advice given to you by your health care provider. Make sure you discuss any questions you have with your health care provider. Document Released: 07/18/2015 Document Revised: 10/11/2017 Document Reviewed: 10/11/2017 Elsevier Patient Education  [place]. How to Perform a Sinus Rinse A sinus rinse is a home treatment that is used to  rinse your sinuses with a sterile mixture of salt and water (saline solution). Sinuses are air-filled spaces in your skull behind the bones of your face and forehead that open into your nasal cavity. A sinus rinse can help to clear mucus, dirt, dust, or pollen from your nasal cavity. You may do a sinus rinse when you have a cold, a virus, nasal allergy symptoms, a sinus infection, or stuffiness in your nose or sinuses. Talk with your health care provider about whether a sinus rinse might help you. What are the risks? A sinus rinse is generally safe and effective. However, there are a few risks, which include:  A burning sensation in your sinuses. This may happen if you do not make the saline solution as directed. Be sure to follow all directions when making the saline solution.  Nasal irritation.  Infection from contaminated water. This is rare, but possible. Do not do a sinus rinse if you have had ear or nasal surgery, ear infection, or blocked ears. Supplies needed:  Saline solution or powder.  Distilled or sterile water may be needed to mix with saline powder. ? You may use boiled and cooled tap water.  Boil tap water for 5 minutes; cool until it is lukewarm. Use within 24 hours. ? Do not use regular tap water to mix with the saline solution.  Neti pot or nasal rinse bottle. These supplies release the saline solution into your nose and through your sinuses. Neti pots and nasal rinse bottles can be purchased at [employer], a health food store, or online. How to perform a sinus rinse  1. Wash your hands with soap and water. 2. Wash your device according to the directions that came with the product and then dry it. 3. Use the solution that comes with your product or one that is sold separately in stores. Follow the mixing directions on the package if you need to mix with sterile or distilled water. 4. Fill the device with the amount of saline solution noted in the device instructions. 5. Stand over a sink and tilt your head sideways over the sink. 6. Place the spout of the device in your upper nostril (the one closer to the ceiling). 7. Gently pour or squeeze the saline solution into your nasal cavity. The liquid should drain out from the lower nostril if you are not too congested. 8. While rinsing, breathe through your open mouth. 9. Gently blow your nose to clear any mucus and rinse solution. Blowing too hard may cause ear pain. 10. Repeat in your other nostril. 11. Clean and rinse your device with clean water and then air-dry it. Talk with your health care provider or pharmacist if you have questions about how to do a sinus rinse. Summary  A sinus rinse is a home treatment that is used to rinse your sinuses with a sterile mixture of salt and water (saline solution).  A sinus rinse is generally safe and effective. Follow all instructions carefully.  Before doing a sinus rinse, talk with your health care provider about whether it would be helpful for you. This information is not intended to replace advice given to you by your health care provider. Make sure you discuss any questions  you have with your health care provider. Document Released: 01/16/2014 Document Revised: 04/18/2017 Document Reviewed: 04/18/2017 Elsevier Patient Education  [place]. Sinusitis, Adult Sinusitis is inflammation of your sinuses. Sinuses are hollow spaces in the bones around your face. Your sinuses are located:  Around your eyes.  In the middle of your forehead.  Behind your nose.  In your cheekbones. Mucus normally drains out of your sinuses. When your nasal tissues become inflamed or swollen, mucus can become trapped or blocked. This allows bacteria, viruses, and fungi to grow, which leads to infection. Most infections of the sinuses are caused by a virus. Sinusitis can develop quickly. It can last for up to 4 weeks (acute) or for more than 12 weeks (chronic). Sinusitis often develops after a cold. What are the causes? This condition is caused by anything that creates swelling in the sinuses or stops mucus from draining. This includes:  Allergies.  Asthma.  Infection from bacteria or viruses.  Deformities or blockages in your nose or sinuses.  Abnormal growths in the nose (nasal polyps).  Pollutants, such as chemicals or irritants in the air.  Infection from fungi (rare). What increases the risk? You are more likely to develop this condition if you:  Have a weak body defense system (immune system).  Do a lot of swimming or diving.  Overuse nasal sprays.  Smoke. What are the signs or symptoms? The main symptoms of this condition are pain and a feeling of pressure around the affected sinuses. Other symptoms include:  Stuffy nose or congestion.  Thick drainage from your nose.  Swelling and warmth over the affected sinuses.  Headache.  Upper toothache.  A cough that may get worse at night.  Extra mucus that collects in the throat or the back of the nose (postnasal drip).  Decreased sense of smell and taste.  Fatigue.  A fever.  Sore throat.  Bad  breath. How is this diagnosed? This condition is diagnosed based on:  Your symptoms.  Your medical history.  A physical exam.  Tests to find out if your condition is acute or chronic. This may include: ? Checking your nose for nasal polyps. ? Viewing your sinuses using a device that has a light (endoscope). ? Testing for allergies or bacteria. ? Imaging tests, such as an MRI or CT scan. In rare cases, a bone biopsy may be done to rule out more serious types of fungal sinus disease. How is this treated? Treatment for sinusitis depends on the cause and whether your condition is chronic or acute.  If caused by a virus, your symptoms should go away on their own within 10 days. You may be given medicines to relieve symptoms. They include: ? Medicines that shrink swollen nasal passages (topical intranasal decongestants). ? Medicines that treat allergies (antihistamines). ? A spray that eases inflammation of the nostrils (topical intranasal corticosteroids). ? Rinses that help get rid of thick mucus in your nose (nasal saline washes).  If caused by bacteria, your health care provider may recommend waiting to see if your symptoms improve. Most bacterial infections will get better without antibiotic medicine. You may be given antibiotics if you have: ? A severe infection. ? A weak immune system.  If caused by narrow nasal passages or nasal polyps, you may need to have surgery. Follow these instructions at home: Medicines  Take, use, or apply over-the-counter and prescription medicines only as told by your health care provider. These may include nasal sprays.  If you were prescribed an antibiotic medicine, take it as told by your health care provider. Do not stop taking the antibiotic even if you start to feel better. Hydrate and humidify   Drink enough fluid to keep your urine pale yellow. Staying hydrated will help to thin  your mucus.  Use a cool mist humidifier to keep the humidity  level in your home above 50%.  Inhale steam for 10-15 minutes, 3-4 times a day, or as told by your health care provider. You can do this in the bathroom while a hot shower is running.  Limit your exposure to cool or dry air. Rest  Rest as much as possible.  Sleep with your head raised (elevated).  Make sure you get enough sleep each night. General instructions   Apply a warm, moist washcloth to your face 3-4 times a day or as told by your health care provider. This will help with discomfort.  Wash your hands often with soap and water to reduce your exposure to germs. If soap and water are not available, use hand sanitizer.  Do not smoke. Avoid being around people who are smoking (secondhand smoke).  Keep all follow-up visits as told by your health care provider. This is important. Contact a health care provider if:  You have a fever.  Your symptoms get worse.  Your symptoms do not improve within 10 days. Get help right away if:  You have a severe headache.  You have persistent vomiting.  You have severe pain or swelling around your face or eyes.  You have vision problems.  You develop confusion.  Your neck is stiff.  You have trouble breathing. Summary  Sinusitis is soreness and inflammation of your sinuses. Sinuses are hollow spaces in the bones around your face.  This condition is caused by nasal tissues that become inflamed or swollen. The swelling traps or blocks the flow of mucus. This allows bacteria, viruses, and fungi to grow, which leads to infection.  If you were prescribed an antibiotic medicine, take it as told by your health care provider. Do not stop taking the antibiotic even if you start to feel better.  Keep all follow-up visits as told by your health care provider. This is important. This information is not intended to replace advice given to you by your health care provider. Make sure you discuss any questions you have with your health care  provider. Document Released: 06/21/2005 Document Revised: 11/21/2017 Document Reviewed: 11/21/2017 Elsevier Patient Education  2020 [employer].

## 2019-10-01 ENCOUNTER — Ambulatory Visit: Payer: PRIVATE HEALTH INSURANCE | Attending: Internal Medicine

## 2019-10-01 DIAGNOSIS — Z23 Encounter for immunization: Secondary | ICD-10-CM

## 2019-10-01 NOTE — Progress Notes (Signed)
   Covid-19 Vaccination Clinic  Name:  Isabella Robertson    MRN: [id number] DOB: [birth year]  10/01/2019  Isabella Robertson was observed post Covid-19 immunization for 15 minutes without incident. She was provided with Vaccine Information Sheet and instruction to access the V-Safe system.   Isabella Robertson was instructed to call 911 with any severe reactions post vaccine: Marland Kitchen Difficulty breathing  . Swelling of face and throat  . A fast heartbeat  . A bad rash all over body  . Dizziness and weakness   Immunizations Administered    Name Date Dose VIS Date Route   Pfizer COVID-19 Vaccine 10/01/2019  2:08 PM 0.3 mL 06/15/2019 Intramuscular   Manufacturer: [place]   Lot: [id number]   [place]: [id number]

## 2019-10-02 ENCOUNTER — Telehealth: Payer: Self-pay | Admitting: Registered Nurse

## 2019-10-02 NOTE — Telephone Encounter (Signed)
Late entry:  Sunday 30 Sep 2019 HR Tim notified me patient out of work Thursday/Friday and left early Wednesday due to runny nose and allergy symptoms/denied fever.  Had covid testing [facility] Friday and still awaiting results.  Started taking her allergy meds and feeling better.  Patient reports history of seasonal allergies and being treated in clinic and using inhaler also.  HR notified her to stay home until test results available and calls him to discuss test results and must be approved to return to work onsite.  01 Oct 2019 patient with negative covid test reported to me by [place].  Agreed with patient history allergic rhinitis and negative test may return to work onsite.  If new symptoms must notify HR/clinic staff ASAP

## 2019-10-02 NOTE — Telephone Encounter (Signed)
Noted and agree. 

## 2019-10-02 NOTE — Telephone Encounter (Signed)
RN spoke with HR Tim 3/29. He reported pt's negative Covid test and reported having already spoken with pt and she is returning to work today 3/30.

## 2019-10-12 ENCOUNTER — Telehealth: Payer: Self-pay | Admitting: *Deleted

## 2019-10-12 MED ORDER — AMOXICILLIN 875 MG PO TABS: 875.0000 mg | ORAL_TABLET | Freq: Two times a day (BID) | ORAL | 0 refills | Status: DC

## 2019-10-12 NOTE — Telephone Encounter (Signed)
Discussed plan of care with RN Hildred Alamin via telephone and reviewed her nursing note in Epic.  Signed amoxicillin 875mg  po BID x 10 days #20 RF0 to her pharmacy of choice.  Agreed with plan of care and continue medications as prescribed.  Last amoxicillin use 2018 per chart review Epic.  Patient to follow up prn if no relief with plan of care.  High pollen count and thunderstorms in area today through weekend may worsen sinus symptoms.

## 2019-10-12 NOTE — Telephone Encounter (Signed)
Pt reports sinus pain/pressure, maxillary and frontal pain x2 weeks. +rhinorrhea, nasal congestion, pressure behind bilateral eyes. Denies cough, chest congestion, otalgia, sore throat, fever/chills, body aches, loss of taste/smell.  Has already had covid testing done, negative test and completed quarantine period. Has been using Zyrtec, nasal saline spray, flonase, albuterol inh and restarted Singulair 2 days ago. Phone order received from NP [facility] for Amoxicillin 875mg  po BID x10 days #20 RF0. Dispensed from [facility]. Also provided with Phenylephrine 10mg  packets from OTC stock, 1-2 tabs q6h prn sinus pain/pressure. Pt denies needing refills on saline, spray, zyrtec, singulair, flonase.

## 2019-10-18 NOTE — Telephone Encounter (Signed)
Pt reports feeling much better after 6 days of Amoxicillin and other home treatments for sinusitis. Reminded to complete full 10 days of abx even if feeling better. She is agreeable to this and denies any new or worsening sx. Denies any questions or concerns.

## 2019-10-19 NOTE — Telephone Encounter (Signed)
Noted patient seen in clinic on 10/16/2019 and she reported she was planning to get her covid vaccination later this week and she wanted to ensure she could receive it if she was taking antibiotics.  Discussed with patient as long as no fever okay to proceed with covid vaccination this week.  Patient verbalized understanding information and had no further questions at this time.

## 2019-10-23 ENCOUNTER — Encounter: Payer: Self-pay | Admitting: Registered Nurse

## 2019-10-23 ENCOUNTER — Telehealth: Payer: Self-pay | Admitting: Registered Nurse

## 2019-10-23 ENCOUNTER — Ambulatory Visit: Payer: PRIVATE HEALTH INSURANCE | Attending: Internal Medicine

## 2019-10-23 DIAGNOSIS — Z23 Encounter for immunization: Secondary | ICD-10-CM

## 2019-10-23 MED ORDER — ALBUTEROL SULFATE HFA 108 (90 BASE) MCG/ACT IN AERS: 1.0000 | INHALATION_SPRAY | RESPIRATORY_TRACT | 0 refills | Status: DC | PRN

## 2019-10-23 NOTE — Progress Notes (Signed)
   Covid-19 Vaccination Clinic  Name:  Isabella Robertson    MRN: [id number] DOB: 06/24/1966  10/23/2019  Ms. Isabella Robertson was observed post Covid-19 immunization for 15 minutes without incident. She was provided with Vaccine Information Sheet and instruction to access the V-Safe system.   Ms. Isabella Robertson was instructed to call 911 with any severe reactions post vaccine: Marland Kitchen Difficulty breathing  . Swelling of face and throat  . A fast heartbeat  . A bad rash all over body  . Dizziness and weakness   Immunizations Administered    Name Date Dose VIS Date Route   Pfizer COVID-19 Vaccine 10/23/2019  2:52 PM 0.3 mL 08/29/2018 Intramuscular   Manufacturer: [place]   Lot: [id number]   [place]: [id number]

## 2019-10-23 NOTE — Telephone Encounter (Signed)
Seasonal allergies flare patient requested refill of albuterol 66mcg 2 puffs po q4-6h prn cough/wheeze #1 6.7g RF0 electronic Rx sent to her pharmacy of choice.  Patient reported has covid vaccine scheduled for today also.  Feeling better after amoxicillin for sinusitis.  Patient verbalized understanding information/instructions, agreed with plan of care and had no further questions at this time.

## 2019-10-25 NOTE — Telephone Encounter (Signed)
Notified by HR Tim patient called out sick today after covid vaccination not feeling well

## 2019-11-07 NOTE — Telephone Encounter (Signed)
Please verify patient returned to work and feeling well again after covid vaccination

## 2019-11-16 NOTE — Telephone Encounter (Signed)
Pt RTW on 4/26 after covid vaccine recovery.

## 2019-11-19 ENCOUNTER — Encounter: Payer: Self-pay | Admitting: Nurse Practitioner

## 2019-11-19 ENCOUNTER — Ambulatory Visit (INDEPENDENT_AMBULATORY_CARE_PROVIDER_SITE_OTHER): Payer: PRIVATE HEALTH INSURANCE | Admitting: Nurse Practitioner

## 2019-11-19 ENCOUNTER — Emergency Department: Payer: No Typology Code available for payment source

## 2019-11-19 ENCOUNTER — Encounter: Payer: Self-pay | Admitting: Emergency Medicine

## 2019-11-19 ENCOUNTER — Other Ambulatory Visit: Payer: Self-pay

## 2019-11-19 ENCOUNTER — Emergency Department
Admission: EM | Admit: 2019-11-19 | Discharge: 2019-11-19 | Disposition: A | Discharge status: Home / Self Care | Payer: No Typology Code available for payment source | Attending: Emergency Medicine | Admitting: Emergency Medicine

## 2019-11-19 VITALS — BP 112/70 | HR 81 | Temp 97.8°F | Ht 63.0 in | Wt 140.0 lb

## 2019-11-19 DIAGNOSIS — R0600 Dyspnea, unspecified: Secondary | ICD-10-CM | POA: Diagnosis not present

## 2019-11-19 DIAGNOSIS — R1010 Upper abdominal pain, unspecified: Secondary | ICD-10-CM | POA: Insufficient documentation

## 2019-11-19 DIAGNOSIS — R10814 Left lower quadrant abdominal tenderness: Secondary | ICD-10-CM

## 2019-11-19 DIAGNOSIS — R52 Pain, unspecified: Secondary | ICD-10-CM

## 2019-11-19 DIAGNOSIS — J45909 Unspecified asthma, uncomplicated: Secondary | ICD-10-CM | POA: Insufficient documentation

## 2019-11-19 DIAGNOSIS — Z79899 Other long term (current) drug therapy: Secondary | ICD-10-CM | POA: Diagnosis not present

## 2019-11-19 DIAGNOSIS — R1011 Right upper quadrant pain: Secondary | ICD-10-CM | POA: Diagnosis not present

## 2019-11-19 DIAGNOSIS — R112 Nausea with vomiting, unspecified: Secondary | ICD-10-CM | POA: Diagnosis not present

## 2019-11-19 DIAGNOSIS — Z87898 Personal history of other specified conditions: Secondary | ICD-10-CM | POA: Diagnosis not present

## 2019-11-19 DIAGNOSIS — R0609 Other forms of dyspnea: Secondary | ICD-10-CM

## 2019-11-19 DIAGNOSIS — R1013 Epigastric pain: Secondary | ICD-10-CM | POA: Insufficient documentation

## 2019-11-19 LAB — COMPREHENSIVE METABOLIC PANEL
ALT: 19 U/L (ref 0–44)
AST: 22 U/L (ref 15–41)
Albumin: 4.4 g/dL (ref 3.5–5.0)
Alkaline Phosphatase: 47 U/L (ref 38–126)
Anion gap: 8 (ref 5–15)
BUN: 17 mg/dL (ref 6–20)
CO2: 24 mmol/L (ref 22–32)
Calcium: 9 mg/dL (ref 8.9–10.3)
Chloride: 108 mmol/L (ref 98–111)
Creatinine, Ser: 0.74 mg/dL (ref 0.44–1.00)
GFR calc Af Amer: >60 mL/min (ref >60)
GFR calc non Af Amer: >60 mL/min (ref >60)
Glucose, Bld: 97 mg/dL (ref 70–99)
Potassium: 3.7 mmol/L (ref 3.5–5.1)
Sodium: 140 mmol/L (ref 135–145)
Total Bilirubin: 1.1 mg/dL (ref 0.3–1.2)
Total Protein: 7.8 g/dL (ref 6.5–8.1)

## 2019-11-19 LAB — URINALYSIS, COMPLETE (UACMP) WITH MICROSCOPIC
Bacteria, UA: NONE SEEN
Bilirubin Urine: NEGATIVE
Glucose, UA: NEGATIVE mg/dL
Ketones, ur: NEGATIVE mg/dL
Nitrite: NEGATIVE
Protein, ur: NEGATIVE mg/dL
Specific Gravity, Urine: 1.021 (ref 1.005–1.030)
pH: 5 (ref 5.0–8.0)

## 2019-11-19 LAB — CBC
HCT: 40.2 % (ref 36.0–46.0)
Hemoglobin: 14.1 g/dL (ref 12.0–15.0)
MCH: 31 pg (ref 26.0–34.0)
MCHC: 35.1 g/dL (ref 30.0–36.0)
MCV: 88.4 fL (ref 80.0–100.0)
Platelets: 238 10*3/uL (ref 150–400)
RBC: 4.55 MIL/uL (ref 3.87–5.11)
RDW: 12.1 % (ref 11.5–15.5)
WBC: 3.6 10*3/uL — ABNORMAL LOW (ref 4.0–10.5)
nRBC: 0 % (ref 0.0–0.2)

## 2019-11-19 LAB — LIPASE, BLOOD: Lipase: 36 U/L (ref 11–51)

## 2019-11-19 MED ORDER — SODIUM CHLORIDE 0.9% FLUSH: 3.0000 mL | Freq: Once | INTRAVENOUS | Status: DC

## 2019-11-19 MED ORDER — PANTOPRAZOLE SODIUM 40 MG PO TBEC: 40.0000 mg | DELAYED_RELEASE_TABLET | Freq: Every day | ORAL | 1 refills | Status: DC

## 2019-11-19 NOTE — Telephone Encounter (Signed)
noted 

## 2019-11-19 NOTE — Discharge Instructions (Signed)
As we discussed please begin taking your pantoprazole instead of omeprazole.  Return to the emergency department for any worsening pain or development of fever otherwise please follow-up with your GI doctor.

## 2019-11-19 NOTE — Progress Notes (Signed)
Established Patient Office Visit  Subjective:  Patient ID: Isabella Robertson, female    DOB: [birth year]  Age: 54 y.o. MRN: [id number]  CC:  Chief Complaint  Patient presents with  . Acute Visit    pain under ribs- right side    HPI Isabella Robertson is a 54 year old with history of gastric ulcer, H pylori  treatment, IBS, GERD  presents for acute stomach pain, bloating, pain under ribs and radiation into upper back associated with nausea/vomiting/SOB.   Chart review: She has  been followed by [facility] and underwent an incomplete  colonoscopy on 04/09/2027 d/t sharp turn angulation. She subsequently had a CT colonoscopy on 04/28/2017 which showed no fixed polypoid filling defects or annular constricting lesions.  No suspicious colonic abnormality.  She was reporting right lower quadrant pain and nausea.  She was using Bentyl and having normal bowel movements daily.  Bentyl has eased her diarrhea.  She had irritable bowel symptoms since her mid 20's.  She was prescribed Citrucel along with the Bentyl and given referral to general surgeon for opinion on adhesions. She saw Dr. Windell Moment on 08/18/2017 for RLQ pain that radiated to medial thigh and right lower back. He recommended continued treatment  for IBS, pain specialist evaluation and if this fails, f/up for consideration of exploratory laparoscopy.    Currently, she reports a history of ulcer and gastritis and has been faithfully taking her omeprazole 40 mg twice daily for years without interruption.  She takes the Carafate only as needed and has not been taking that regularly. She has not had the RLQ pain.   On 09/26/2019, she developed epigastric burning pain that spread across her bilateral upper abdomen into her left chest and into her upper back on 09/26/2019.  This was associated with nausea, belching and dry heaves with eventual vomiting of yellow foamy emesis x2. She was weak and felt bad. She did feel SOB on  09/26/2019 and began using an old inhaler (for hx of asthma) This helped her breathe better. Over the next few days, she felt weak, poor appetite  and eventually her stomach symptoms improved.   The trigger for her symptoms were unknown, but she did eat at restaurants. No family or ill contacts. Covid vaccines 10/01/2019 and 10/23/2019.   She was back to her baseline and taking her omeprazole 40 mg twice daily. She has normal BM. On  11/13/2019, she felt terrible and it persisted into Wednesday. Wed morning ,she felt the upper abdominal pain with radiation into upper back and left cheat pain. She noted a fever of  99.9-100.  She had bloating, abdominal distention and epigastric burning across her epigastrium  and into the upper bilateral back. She had to leave work. She spent the day in bed and any  movement-caused pain all over upper abdomen-burning.  On Friday and Saturday, she had nausea and one episode of vomiting in the afternoon. She had left upper gas pain that radiated into her chest. She felt SOB again and used her inhaler. After a few hours, she felt a little better. Yesterday, she was feeling better, mild nausea and poor appetite. She ate very little.    Today, she has nausea, abdominal pain rated 6 out of 10 in generalized epigastric and LLQ area. She did eat a few bites of potatoes, carrots, rice and had no vomiting. No chest pain or SOB today.  No NSAID's. No ETOH, non-smoking. No drugs. She has been worried  about her gallbladder. She thought she was having a heart attack when the March event occurred. She does report DOE and states she was referred to Cardiology in the past and she canceled the appointment.   Past Medical History:  Diagnosis Date  . Asthma   . Chronic abdominal pain   . Chronic abdominal pain   . Chronic constipation   . Diffuse cystic mastopathy   . Diverticulitis   . Endometriosis    History of; Now s/p hysterectomy.  . Genetic screening 11/28/2012   negative /[facility]   . Helicobacter pylori (H. pylori) infection    x 3 per as of 06/2017   . IBS (irritable bowel syndrome)    diarrhea type per pt as of 06/2017     Past Surgical History:  Procedure Laterality Date  . ABDOMINAL HYSTERECTOMY  2007  . CESAREAN SECTION  2003  . COLONOSCOPY  2010  . COLONOSCOPY WITH PROPOFOL N/A 04/08/2017   Procedure: COLONOSCOPY WITH PROPOFOL;  Surgeon: Isabella Sails, MD;  Location: [facility] ENDOSCOPY;  Service: Endoscopy;  Laterality: N/A;  . ECTOPIC PREGNANCY SURGERY  [id number]  . EYE SURGERY  2003  . HERNIA REPAIR  age 70    Family History  Problem Relation Age of Onset  . Breast cancer Paternal Aunt   . Breast cancer Mother 66  . Breast cancer Cousin        breast - paternal side    Social History   Socioeconomic History  . Marital status: Married    Spouse name: Not on file  . Number of children: Not on file  . Years of education: Not on file  . Highest education level: Not on file  Occupational History  . Not on file  Tobacco Use  . Smoking status: Never Smoker  . Smokeless tobacco: Never Used  Substance and Sexual Activity  . Alcohol use: No    Alcohol/week: 0.0 standard drinks  . Drug use: No  . Sexual activity: Yes  Other Topics Concern  . Not on file  Social History Narrative  . Not on file   Social Determinants of Health   Financial Resource Strain:   . Difficulty of Paying Living Expenses:   Food Insecurity:   . Worried About Charity fundraiser in the Last Year:   . Arboriculturist in the Last Year:   Transportation Needs:   . Film/video editor (Medical):   Marland Kitchen Lack of Transportation (Non-Medical):   Physical Activity:   . Days of Exercise per Week:   . Minutes of Exercise per Session:   Stress:   . Feeling of Stress :   Social Connections:   . Frequency of Communication with Friends and Family:   . Frequency of Social Gatherings with Friends and Family:   . Attends Religious Services:   . Active Member of Clubs or  Organizations:   . Attends Archivist Meetings:   Marland Kitchen Marital Status:   Intimate Partner Violence:   . Fear of Current or Ex-Partner:   . Emotionally Abused:   Marland Kitchen Physically Abused:   . Sexually Abused:     Outpatient Medications Prior to Visit  Medication Sig Dispense Refill  . albuterol (VENTOLIN HFA) 108 (90 Base) MCG/ACT inhaler Inhale 1-2 puffs into the lungs every 4 (four) hours as needed for wheezing or shortness of breath. 6.7 g 0  . cetirizine (ZYRTEC) 10 MG tablet Take 10 mg daily as needed by mouth.    Marland Kitchen  dicyclomine (BENTYL) 20 MG tablet Take 1 tablet (20 mg total) by mouth 4 (four) times daily - after meals and at bedtime. 120 tablet 0  . fluticasone (FLONASE) 50 MCG/ACT nasal spray PLACE 1 SPRAY INTO BOTH NOSTRILS 2 (TWO) TIMES DAILY 16 g 2  . montelukast (SINGULAIR) 10 MG tablet Take 1 tablet (10 mg total) by mouth at bedtime. 30 tablet 3  . omeprazole (PRILOSEC) 40 MG capsule Take 1 capsule by mouth 2 (two) times daily.    . sucralfate (CARAFATE) 1 g tablet TAKE 1 TABLET (1 G TOTAL) BY MOUTH 4 (FOUR) TIMES DAILY - WITH MEALS AND AT BEDTIME. 360 tablet 1  . amoxicillin (AMOXIL) 875 MG tablet Take 1 tablet (875 mg total) by mouth 2 (two) times daily. 20 tablet 0  . loratadine (CLARITIN) 10 MG tablet Take 1 tablet (10 mg total) by mouth daily. 30 tablet 2  . sodium chloride (OCEAN) 0.65 % SOLN nasal spray Place 2 sprays into both nostrils every 2 (two) hours while awake.  0   No facility-administered medications prior to visit.    Allergies  Allergen Reactions  . Shrimp [Shellfish Allergy]     ROS Review of Systems  Constitutional: Positive for chills and fever.  HENT: Positive for sinus pressure. Negative for congestion.   Respiratory: Positive for shortness of breath. Negative for cough.   Cardiovascular: Positive for chest pain. Negative for palpitations and leg swelling.  Gastrointestinal: Positive for abdominal pain, nausea and vomiting. Negative for  constipation and diarrhea.  Genitourinary: Negative for dysuria.  Musculoskeletal: Negative.   Skin: Positive for rash.  Allergic/Immunologic: Negative.   Neurological: Negative.   Psychiatric/Behavioral:       No concerns with depression or anxiety.       Objective:    Physical Exam  Constitutional: She is oriented to person, place, and time. She appears well-developed and well-nourished.  HENT:  Head: Normocephalic.  Eyes: Pupils are equal, round, and reactive to light. Conjunctivae are normal.  Cardiovascular: Normal rate and regular rhythm.  Pulmonary/Chest: Effort normal and breath sounds normal.  Abdominal: Soft. She exhibits no distension and no mass. There is abdominal tenderness. There is guarding. There is no rebound.  Tender LLQ abdomen and radiated to center of abdomen. Guarding LLQ abdomen. No rebound tenderness.  Mild RUQ and RLQ tenderness, negative Murphy sign.   Musculoskeletal:        General: Normal range of motion.     Cervical back: Normal range of motion and neck supple.  Neurological: She is alert and oriented to person, place, and time.  Skin: Skin is warm and dry. No rash noted.  Psychiatric: She has a normal mood and affect. Her behavior is normal. Judgment and thought content normal.  Vitals reviewed.   BP 112/70 (BP Location: Left Arm, Patient Position: Sitting, Cuff Size: Small)   Pulse 81   Temp 97.8 F (36.6 C) (Skin)   Ht '5\' 3"'  (1.6 m)   Wt 140 lb (63.5 kg)   SpO2 98%   BMI 24.80 kg/m  Wt Readings from Last 3 Encounters:  11/19/19 139 lb 15.9 oz (63.5 kg)  11/19/19 140 lb (63.5 kg)  10/10/17 145 lb (65.8 kg)     Health Maintenance Due  Topic Date Due  . HIV Screening  Never done    There are no preventive care reminders to display for this patient.  Lab Results  Component Value Date   TSH 1.850 04/11/2017   Lab Results  Component  Value Date   WBC 3.6 (L) 11/19/2019   HGB 14.1 11/19/2019   HCT 40.2 11/19/2019   MCV 88.4  11/19/2019   PLT 238 11/19/2019   Lab Results  Component Value Date   NA 140 11/19/2019   K 3.7 11/19/2019   CO2 24 11/19/2019   GLUCOSE 97 11/19/2019   BUN 17 11/19/2019   CREATININE 0.74 11/19/2019   BILITOT 1.1 11/19/2019   ALKPHOS 47 11/19/2019   AST 22 11/19/2019   ALT 19 11/19/2019   PROT 7.8 11/19/2019   ALBUMIN 4.4 11/19/2019   CALCIUM 9.0 11/19/2019   ANIONGAP 8 11/19/2019   GFR 83.93 06/20/2017   Lab Results  Component Value Date   CHOL 246 (H) 04/11/2017   Lab Results  Component Value Date   HDL 46 04/11/2017   Lab Results  Component Value Date   LDLCALC 157 (H) 04/11/2017   Lab Results  Component Value Date   TRIG 216 (H) 04/11/2017   Lab Results  Component Value Date   CHOLHDL 5.3 (H) 04/11/2017   Lab Results  Component Value Date   HGBA1C 5.2 04/11/2017      Assessment & Plan:   Problem List Items Addressed This Visit      Digestive   Nausea and vomiting     Other   Epigastric abdominal pain - Primary   Left lower quadrant abdominal tenderness without rebound tenderness   DOE (dyspnea on exertion)   History of chest pain     54 year old with history of gastric ulcer, H pylori  treatment, IBS, GERD  presents for acute stomach pain, bloating, pain under ribs and radiation into upper back associated with nausea/vomiting/left chest pain/SOB.   She reports 2 severe spells of epigastric pain, with radiation into her bilateral upper back and into left chest with SOB to the point that she used old inhalers to breathe better.  She thought she was having a heart attack in March when this first happened. She relaxed, took deep breaths, walked around the house and in 2 hours the symptoms improved.   The same symptoms happened last week and she is slowly recovering from this over the weekend. Today, she has no CP or  SOB now. She has vague nausea, tolerating diet, but endorses, 6 out of 10 left sided upper and lower abdominal pain. She is stable and in  no distress.   The differential Dx is wide and I recommend ER evaluation for a timely Dx with  labs, and imaging to R/O cardiac cause of her symptoms. biliary /gallbladder disease, pancreatitis, gastric ulcer/PUD, diverticulitis. These events are not typical of her usual IBS, gastritis. The patient is in agreement.   Follow-up: No follow-ups on file.   This visit occurred during the SARS-CoV-2 public health emergency.  Safety protocols were in place, including screening questions prior to the visit, additional usage of staff PPE, and extensive cleaning of exam room while observing appropriate contact time as indicated for disinfecting solutions.   Denice Paradise, NP

## 2019-11-19 NOTE — ED Provider Notes (Signed)
[facility] Emergency Department Provider Note  Time seen: 8:57 PM  I have reviewed the triage vital signs and the nursing notes.   HISTORY  Chief Complaint Abdominal Pain and Chest Pain   HPI Isabella Robertson is a 54 y.o. female with a past medical history of asthma, chronic abdominal pain, H pylori, IBS, presents to the emergency department for upper abdominal discomfort.  According to the patient of the past  3 months or so she has been experiencing intermittent upper abdominal pain especially in the right upper quadrant.  Patient called her doctor and was seen at lower GI today.  Patient states however given her upper quadrant abdominal pain they sent her to the emergency department for evaluation.  Patient denies any chest pain or shortness of breath to myself.  States she has been experiencing upper abdominal pain which is fairly longstanding but has been more so on the right upper quadrant lately.  Patient takes omeprazole, Carafate and Bentyl.  Patient states she was referred by her GI doctor to possible GI surgery as she had an adhesion surgery in 2007 they believe this could be the same issue once again.  Patient denies any fever.  States intermittent nausea occasionally with vomiting in the morning.  No diarrhea at this time.  Past Medical History:  Diagnosis Date  . Asthma   . Chronic abdominal pain   . Chronic abdominal pain   . Chronic constipation   . Diffuse cystic mastopathy   . Diverticulitis   . Endometriosis    History of; Now s/p hysterectomy.  . Genetic screening 11/28/2012   negative /[facility]  . Helicobacter pylori (H. pylori) infection    x 3 per as of 06/2017   . IBS (irritable bowel syndrome)    diarrhea type per pt as of 06/2017     Patient Active Problem List   Diagnosis Date Noted  . Epigastric abdominal pain 11/19/2019  . Nausea and vomiting 11/19/2019  . Left lower quadrant abdominal tenderness without rebound tenderness  11/19/2019  . DOE (dyspnea on exertion) 11/19/2019  . History of chest pain 11/19/2019  . Allergic rhinitis 10/10/2017  . HLD (hyperlipidemia) 10/10/2017  . Leukopenia 08/12/2017  . GERD (gastroesophageal reflux disease) 06/24/2017  . Chronic abdominal pain 06/24/2017  . Vertigo 11/25/2016  . Headache 04/23/2016  . Other irritable bowel syndrome 02/05/2015  . History of Helicobacter pylori infection 02/05/2015  . History of gastric ulcer 02/05/2015  . Lipoma 05/14/2013  . Family history of malignant neoplasm of breast 11/23/2012  . Diffuse cystic mastopathy 11/23/2012    Past Surgical History:  Procedure Laterality Date  . ABDOMINAL HYSTERECTOMY  2007  . CESAREAN SECTION  2003  . COLONOSCOPY  2010  . COLONOSCOPY WITH PROPOFOL N/A 04/08/2017   Procedure: COLONOSCOPY WITH PROPOFOL;  Surgeon: Lollie Sails, MD;  Location: [facility] ENDOSCOPY;  Service: Endoscopy;  Laterality: N/A;  . ECTOPIC PREGNANCY SURGERY  [id number]  . EYE SURGERY  2003  . HERNIA REPAIR  age 39    Prior to Admission medications   Medication Sig Start Date End Date Taking? Authorizing Provider  albuterol (VENTOLIN HFA) 108 (90 Base) MCG/ACT inhaler Inhale 1-2 puffs into the lungs every 4 (four) hours as needed for wheezing or shortness of breath. 10/23/19   Betancourt, Aura Fey, NP  cetirizine (ZYRTEC) 10 MG tablet Take 10 mg daily as needed by mouth. 10/26/16   [provider]  dicyclomine (BENTYL) 20 MG tablet Take 1 tablet (  20 mg total) by mouth 4 (four) times daily - after meals and at bedtime. 12/12/18   Betancourt, Aura Fey, NP  fluticasone (FLONASE) 50 MCG/ACT nasal spray PLACE 1 SPRAY INTO BOTH NOSTRILS 2 (TWO) TIMES DAILY 10/27/18   [name], MD  montelukast (SINGULAIR) 10 MG tablet Take 1 tablet (10 mg total) by mouth at bedtime. 05/15/19   Betancourt, Aura Fey, NP  omeprazole (PRILOSEC) 40 MG capsule Take 1 capsule by mouth 2 (two) times daily. 08/18/18   [provider]  sucralfate  (CARAFATE) 1 g tablet TAKE 1 TABLET (1 G TOTAL) BY MOUTH 4 (FOUR) TIMES DAILY - WITH MEALS AND AT BEDTIME. 07/12/18   McLean-Scocuzza, Nino Glow, MD    Allergies  Allergen Reactions  . Shrimp [Shellfish Allergy]     Family History  Problem Relation Age of Onset  . Breast cancer Paternal Aunt   . Breast cancer Mother 25  . Breast cancer Cousin        breast - paternal side    Social History Social History   Tobacco Use  . Smoking status: Never Smoker  . Smokeless tobacco: Never Used  Substance Use Topics  . Alcohol use: No    Alcohol/week: 0.0 standard drinks  . Drug use: No    Review of Systems Constitutional: Negative for fever. Cardiovascular: Negative for chest pain. Respiratory: Negative for shortness of breath. Gastrointestinal: Upper abdominal discomfort.  Intermittent nausea.  Occasional vomiting in the morning. Genitourinary: Negative for urinary compaints Musculoskeletal: Negative for musculoskeletal complaints Neurological: Negative for headache All other ROS negative  ____________________________________________   PHYSICAL EXAM:  VITAL SIGNS: ED Triage Vitals [11/19/19 1701]  Enc Vitals Group     BP (!) 141/80     Pulse Rate 73     Resp 16     Temp 98.4 F (36.9 C)     Temp Source Oral     SpO2 98 %     Weight 139 lb 15.9 oz (63.5 kg)     Height '5\' 3"'  (1.6 m)     Head Circumference      Peak Flow      Pain Score 0     Pain Loc      Pain Edu?      Excl. in [place]?     Constitutional: Alert and oriented. Well appearing and in no distress. Eyes: Normal exam ENT      Head: Normocephalic and atraumatic.      Mouth/Throat: Mucous membranes are moist. Cardiovascular: Normal rate, regular rhythm.  Respiratory: Normal respiratory effort without tachypnea nor retractions. Breath sounds are clear  Gastrointestinal: Soft, no reaction to abdominal tenderness, states very mild epigastric tenderness.  No rebound guarding or distention. Musculoskeletal:  Nontender with normal range of motion in all extremities. Neurologic:  Normal speech and language. No gross focal neurologic deficits  Skin:  Skin is warm, dry and intact.  Psychiatric: Mood and affect are normal.   ____________________________________________    EKG  EKG viewed and interpreted by myself shows a normal sinus rhythm at 67 bpm with a narrow QRS, normal axis, normal intervals, no concerning ST changes.  ____________________________________________    RADIOLOGY  Normal right upper quadrant ultrasound, besides hepatic steatosis  ____________________________________________   INITIAL IMPRESSION / ASSESSMENT AND PLAN / ED COURSE  Pertinent labs & imaging results that were available during my care of the patient were reviewed by me and considered in my medical decision making (see chart for details).  Patient presents emergency department for upper abdominal discomfort.  Patient's work-up is reassuring including lab work.  LFTs are normal, lipase is normal with a normal white blood cell count.  Ultrasound shows hepatic steatosis otherwise normal including a normal gallbladder.  I discussed with the patient likelihood of stomach issues causing her discomfort.  We will place the patient on Protonix instead of omeprazole.  Patient states she will follow-up with her GI doctor.  Isabella Robertson was evaluated in Emergency Department on 11/19/2019 for the symptoms described in the history of present illness. She was evaluated in the context of the global COVID-19 pandemic, which necessitated consideration that the patient might be at risk for infection with the SARS-CoV-2 virus that causes COVID-19. Institutional protocols and algorithms that pertain to the evaluation of patients at risk for COVID-19 are in a state of rapid change based on information released by regulatory bodies including the CDC and federal and state organizations. These policies and algorithms were followed  during the patient's care in the ED.  ____________________________________________   FINAL CLINICAL IMPRESSION(S) / ED DIAGNOSES  Upper abdominal pain   Harvest Dark, MD 11/19/19 2100

## 2019-11-19 NOTE — ED Triage Notes (Signed)
Arrives from [facility] for ED evaluation of 3 month hisotry of RUQ abdominal pain/ bloating-- after eating and intermittent CP and SOB.  Patient is AAOx3.  Skin warm and dry. NAD

## 2019-11-19 NOTE — Patient Instructions (Signed)
Please go to the ED for evaluation of abdominal pain and nausea vomiting associated with episodic left sided CP and shortness of breath.

## 2019-11-20 ENCOUNTER — Ambulatory Visit: Payer: Self-pay | Admitting: *Deleted

## 2019-11-20 DIAGNOSIS — R8290 Unspecified abnormal findings in urine: Secondary | ICD-10-CM

## 2019-11-20 NOTE — Progress Notes (Signed)
Pt reports being seen at PCP yesterday and referred to ER for GI pain. Per pcp notes, pt stable and in no distress but referred to ER due to wide differential Dx.  Pt reports GI pain has resolved today. Wants to switch GI providers as she has been with current one for 10 years and has had no improvement. In network provider list given to pt to choose from. Pt to call preferred office and set up new pt appt.  Pt also reports being told she had UTI in ED but no abx prescribed. No mention of UTI in ED notes, but lab result shows blood and leukocytes present. Pt does not want to start abx if not needed right now as she feels this will worsen her abd pain issues. She denies any urinary s/sx. Spoke with NP Otila Kluver over phone regarding this. Order received for urine culture and NP appt Thursday 5/20.

## 2019-11-22 LAB — URINE CULTURE

## 2019-11-26 ENCOUNTER — Telehealth: Payer: Self-pay | Admitting: Gastroenterology

## 2019-11-26 ENCOUNTER — Telehealth: Payer: Self-pay | Admitting: *Deleted

## 2019-11-26 NOTE — Telephone Encounter (Signed)
Pt requests 2nd opinion on GI issues. Currently seeing Dr. Gustavo Lah at [facility]. Chose [facility], Dr. Havery Moros or Dr. Tarri Glenn. [facility] does not do 2nd opinions, only transfer of care. Office will call RN to schedule appt once medical records received from [facility], reviewed by provider and pt accepted.  Medical record release form signed by pt, faxed to [facility] for all records from 2015-current. Advised pt of same and that RN will contact her in next 1-2 weeks once appt is scheduled. She does prefer afternoon appt, after 2:30p, any day of the week. ROI scanned into Documents.

## 2019-11-26 NOTE — Telephone Encounter (Addendum)
Haley from [facility] called requesting to schedule patient. Patient is currently with [facility] requested transfer of care. Patient is requesting to continue care with  Dr. Havery Moros or Dr. Tarri Glenn

## 2019-11-26 NOTE — Telephone Encounter (Signed)
Noted patient has requested transfer of care to new GI provider.  Symptoms resolved last week and patient did not come to appt in clinic for re-evaluation by NP

## 2019-11-27 NOTE — Telephone Encounter (Signed)
Spoke with patient in workcenter today still awaiting scheduling of appt with new GI provider.  Symptoms her usual occasional abdominal discomfort denied n/v/d/constipation today tolerating po intake.  To schedule follow up with me for re-evaluation or with PCM if clinic closed e.g. Wed/Sat/Sun.  Patient asked about FMLA paperwork discussed to follow up with PCM/GI as I am unable by contract to complete FMLA paperwork for her missed days in past month due to abdomen pain.  Patient had drank smoothie today  Denied hematochezia and stated stools normal color/size but when having abdomen pain she feels like food is getting stuck in her intestines.  Patient feels able to perform her work duties today.   Patient verbalized understanding information/instructions,agreed with plan of care and had no further questions at this time.

## 2019-11-29 NOTE — Telephone Encounter (Signed)
Records received and sent to Dr. Tarri Glenn for review.

## 2019-11-29 NOTE — Telephone Encounter (Signed)
Please let me know when patient appt scheduled/accepted by new provider

## 2019-12-04 NOTE — Telephone Encounter (Signed)
Noted records still pending review for new provider GI acceptance of patient.

## 2019-12-04 NOTE — Telephone Encounter (Signed)
Per [facility] review, records pending review and acceptance by GI MD as of 11/29/19.

## 2019-12-07 NOTE — Telephone Encounter (Signed)
Reviewed Epic no GI appt pending or further notes from [place] provider.

## 2019-12-12 NOTE — Telephone Encounter (Signed)
OK to schedule office visit   Reviewed 27 pages of records Followed at the [facility]. Last seen 08/18/18.  Referred by [facility] at [facility]. History of c section, laparoscopic evaluation for endometriosis, vaginal hysterectomy, and LOA.  Years of chronic lower abdominal pain since her 34s improved with dicyclomine. Diagnosed with IBS and lactose intolerance. Diarrhea that is slowed with Carafate.  History of H pylori by stool antigen 2015. Treated with Pylera.  Reference in referral records to treatment two times prior to Pylera.  Diverticulosis Lower chest and upper abdominal pain. Sharp, awakes her from sleep.  Feels like she needs to belch Omeprazole 20 mg daily  CT abd/pelvis 2015 for abdominal pain: tiny left inguinal hernia, no reason for pain  EGD 2007 with H pylori gastritis Colonoscopy 2010 incomplete due to tortuous colon Colonoscopy 2018 with Dr. Gustavo Lah: incomplete due to sharp angulation at 42-44 cm from the anal verge, diverticulosis in the sigmoid and descending colon. Biopsies taken from the left colon to exclude microscopic colitis. Biopsies were normal.  CT colonoscopy 10/18: no polyps. No acute abnormality.

## 2019-12-13 NOTE — Telephone Encounter (Signed)
Called patient to schedule left voicemail. 

## 2019-12-14 NOTE — Telephone Encounter (Signed)
Per [facility] chart review, pt has appt scheduled with Dr. Laurier Nancy at [place] on 02/04/20.

## 2019-12-20 NOTE — Telephone Encounter (Signed)
Noted GI appt scheduled with new provider per RN Hildred Alamin Epic review

## 2020-01-15 ENCOUNTER — Other Ambulatory Visit: Payer: Self-pay

## 2020-01-15 ENCOUNTER — Ambulatory Visit
Admission: EM | Admit: 2020-01-15 | Discharge: 2020-01-15 | Disposition: A | Discharge status: Home / Self Care | Payer: PRIVATE HEALTH INSURANCE

## 2020-01-15 ENCOUNTER — Emergency Department
Admission: EM | Admit: 2020-01-15 | Discharge: 2020-01-15 | Disposition: A | Discharge status: Home / Self Care | Payer: No Typology Code available for payment source | Attending: Emergency Medicine | Admitting: Emergency Medicine

## 2020-01-15 ENCOUNTER — Emergency Department: Payer: No Typology Code available for payment source

## 2020-01-15 DIAGNOSIS — J45909 Unspecified asthma, uncomplicated: Secondary | ICD-10-CM | POA: Insufficient documentation

## 2020-01-15 DIAGNOSIS — Z7951 Long term (current) use of inhaled steroids: Secondary | ICD-10-CM | POA: Diagnosis not present

## 2020-01-15 DIAGNOSIS — R109 Unspecified abdominal pain: Secondary | ICD-10-CM | POA: Diagnosis present

## 2020-01-15 DIAGNOSIS — R1084 Generalized abdominal pain: Secondary | ICD-10-CM

## 2020-01-15 DIAGNOSIS — R1033 Periumbilical pain: Secondary | ICD-10-CM | POA: Diagnosis not present

## 2020-01-15 DIAGNOSIS — K529 Noninfective gastroenteritis and colitis, unspecified: Secondary | ICD-10-CM

## 2020-01-15 LAB — POCT PREGNANCY, URINE: Preg Test, Ur: NEGATIVE

## 2020-01-15 LAB — CBC
HCT: 43.3 % (ref 36.0–46.0)
Hemoglobin: 14.9 g/dL (ref 12.0–15.0)
MCH: 30.8 pg (ref 26.0–34.0)
MCHC: 34.4 g/dL (ref 30.0–36.0)
MCV: 89.5 fL (ref 80.0–100.0)
Platelets: 281 10*3/uL (ref 150–400)
RBC: 4.84 MIL/uL (ref 3.87–5.11)
RDW: 12.2 % (ref 11.5–15.5)
WBC: 5.3 10*3/uL (ref 4.0–10.5)
nRBC: 0 % (ref 0.0–0.2)

## 2020-01-15 LAB — URINALYSIS, COMPLETE (UACMP) WITH MICROSCOPIC
Bilirubin Urine: NEGATIVE
Glucose, UA: NEGATIVE mg/dL
Hgb urine dipstick: NEGATIVE
Ketones, ur: NEGATIVE mg/dL
Leukocytes,Ua: NEGATIVE
Nitrite: NEGATIVE
Protein, ur: NEGATIVE mg/dL
Specific Gravity, Urine: 1.014 (ref 1.005–1.030)
pH: 8 (ref 5.0–8.0)

## 2020-01-15 LAB — COMPREHENSIVE METABOLIC PANEL
ALT: 27 U/L (ref 0–44)
AST: 27 U/L (ref 15–41)
Albumin: 4.9 g/dL (ref 3.5–5.0)
Alkaline Phosphatase: 54 U/L (ref 38–126)
Anion gap: 9 (ref 5–15)
BUN: 13 mg/dL (ref 6–20)
CO2: 26 mmol/L (ref 22–32)
Calcium: 9.4 mg/dL (ref 8.9–10.3)
Chloride: 102 mmol/L (ref 98–111)
Creatinine, Ser: 0.97 mg/dL (ref 0.44–1.00)
GFR calc Af Amer: >60 mL/min (ref >60)
GFR calc non Af Amer: >60 mL/min (ref >60)
Glucose, Bld: 97 mg/dL (ref 70–99)
Potassium: 4 mmol/L (ref 3.5–5.1)
Sodium: 137 mmol/L (ref 135–145)
Total Bilirubin: 1 mg/dL (ref 0.3–1.2)
Total Protein: 8.4 g/dL — ABNORMAL HIGH (ref 6.5–8.1)

## 2020-01-15 LAB — LIPASE, BLOOD: Lipase: 27 U/L (ref 11–51)

## 2020-01-15 MED ORDER — MORPHINE SULFATE (PF) 4 MG/ML IV SOLN
4.0000 mg | Freq: Once | INTRAVENOUS | Status: AC
  Administered 2020-01-15: 4 mg via INTRAVENOUS
  Filled 2020-01-15: qty 1

## 2020-01-15 MED ORDER — TRAMADOL HCL 50 MG PO TABS: 50.0000 mg | ORAL_TABLET | Freq: Four times a day (QID) | ORAL | 0 refills | Status: DC | PRN

## 2020-01-15 MED ORDER — IOHEXOL 300 MG/ML  SOLN
100.0000 mL | Freq: Once | INTRAMUSCULAR | Status: AC | PRN
  Administered 2020-01-15: 100 mL via INTRAVENOUS
  Filled 2020-01-15: qty 100

## 2020-01-15 MED ORDER — IBUPROFEN 400 MG PO TABS
400.0000 mg | ORAL_TABLET | Freq: Once | ORAL | Status: AC | PRN
  Administered 2020-01-15: 400 mg via ORAL
  Filled 2020-01-15: qty 1

## 2020-01-15 MED ORDER — SODIUM CHLORIDE 0.9% FLUSH: 3.0000 mL | Freq: Once | INTRAVENOUS | Status: DC

## 2020-01-15 MED ORDER — ONDANSETRON HCL 4 MG/2ML IJ SOLN
4.0000 mg | Freq: Once | INTRAMUSCULAR | Status: AC
  Administered 2020-01-15: 4 mg via INTRAVENOUS
  Filled 2020-01-15: qty 2

## 2020-01-15 NOTE — Discharge Instructions (Signed)
I think that you need to be further evaluated in the ER due to the severity of your pain

## 2020-01-15 NOTE — ED Provider Notes (Addendum)
[facility] Emergency Department Provider Note   ____________________________________________    I have reviewed the triage vital signs and the nursing notes.   HISTORY  Chief Complaint Abdominal Pain     HPI Isabella Robertson is a 54 y.o. female who presents with complaints of abdominal pain.  Patient describes periumbilical abdominal pain that is severe in nature started only a few hours ago.  She was apparently seen in urgent care prior to arrival in the emergency department.  She has not taken anything for this.  She notes that this happened in May as well and resolved with pain medication.  Reviewed records from that visit, patient had normal ultrasound at that time.  Denies fevers or chills.  Normal stools.  Past Medical History:  Diagnosis Date  . Asthma   . Chronic abdominal pain   . Chronic abdominal pain   . Chronic constipation   . Diffuse cystic mastopathy   . Diverticulitis   . Endometriosis    History of; Now s/p hysterectomy.  . Genetic screening 11/28/2012   negative /[facility]  . Helicobacter pylori (H. pylori) infection    x 3 per as of 06/2017   . IBS (irritable bowel syndrome)    diarrhea type per pt as of 06/2017     Patient Active Problem List   Diagnosis Date Noted  . Epigastric abdominal pain 11/19/2019  . Nausea and vomiting 11/19/2019  . Left lower quadrant abdominal tenderness without rebound tenderness 11/19/2019  . DOE (dyspnea on exertion) 11/19/2019  . History of chest pain 11/19/2019  . Allergic rhinitis 10/10/2017  . HLD (hyperlipidemia) 10/10/2017  . Leukopenia 08/12/2017  . GERD (gastroesophageal reflux disease) 06/24/2017  . Chronic abdominal pain 06/24/2017  . Vertigo 11/25/2016  . Headache 04/23/2016  . Other irritable bowel syndrome 02/05/2015  . History of Helicobacter pylori infection 02/05/2015  . History of gastric ulcer 02/05/2015  . Lipoma 05/14/2013  . Family history of malignant  neoplasm of breast 11/23/2012  . Diffuse cystic mastopathy 11/23/2012    Past Surgical History:  Procedure Laterality Date  . ABDOMINAL HYSTERECTOMY  2007  . CESAREAN SECTION  2003  . COLONOSCOPY  2010  . COLONOSCOPY WITH PROPOFOL N/A 04/08/2017   Procedure: COLONOSCOPY WITH PROPOFOL;  Surgeon: Lollie Sails, MD;  Location: [facility] ENDOSCOPY;  Service: Endoscopy;  Laterality: N/A;  . ECTOPIC PREGNANCY SURGERY  [id number]  . EYE SURGERY  2003  . HERNIA REPAIR  age 36    Prior to Admission medications   Medication Sig Start Date End Date Taking? Authorizing Provider  albuterol (VENTOLIN HFA) 108 (90 Base) MCG/ACT inhaler Inhale 1-2 puffs into the lungs every 4 (four) hours as needed for wheezing or shortness of breath. 10/23/19   Betancourt, Aura Fey, NP  cetirizine (ZYRTEC) 10 MG tablet Take 10 mg daily as needed by mouth. 10/26/16   [provider]  dicyclomine (BENTYL) 20 MG tablet Take 1 tablet (20 mg total) by mouth 4 (four) times daily - after meals and at bedtime. 12/12/18   Betancourt, Aura Fey, NP  fluticasone (FLONASE) 50 MCG/ACT nasal spray PLACE 1 SPRAY INTO BOTH NOSTRILS 2 (TWO) TIMES DAILY 10/27/18   [name], MD  montelukast (SINGULAIR) 10 MG tablet Take 1 tablet (10 mg total) by mouth at bedtime. 05/15/19   Betancourt, Aura Fey, NP  omeprazole (PRILOSEC) 40 MG capsule Take 1 capsule by mouth 2 (two) times daily. 08/18/18   [provider]  pantoprazole (  PROTONIX) 40 MG tablet Take 1 tablet (40 mg total) by mouth daily. 11/19/19 11/18/20  Harvest Dark, MD  sucralfate (CARAFATE) 1 g tablet TAKE 1 TABLET (1 G TOTAL) BY MOUTH 4 (FOUR) TIMES DAILY - WITH MEALS AND AT BEDTIME. 07/12/18   McLean-Scocuzza, Nino Glow, MD  traMADol (ULTRAM) 50 MG tablet Take 1 tablet (50 mg total) by mouth every 6 (six) hours as needed. 01/15/20 01/14/21  Lavonia Drafts, MD     Allergies Shrimp [shellfish allergy]  Family History  Problem Relation Age of Onset  . Breast cancer  Paternal Aunt   . Breast cancer Mother 44  . Breast cancer Cousin        breast - paternal side    Social History Social History   Tobacco Use  . Smoking status: Never Smoker  . Smokeless tobacco: Never Used  Substance Use Topics  . Alcohol use: No    Alcohol/week: 0.0 standard drinks  . Drug use: No    Review of Systems  Constitutional: No fever/chills Eyes: No visual changes.  ENT: No sore throat. Cardiovascular: Denies chest pain. Respiratory: Denies shortness of breath. Gastrointestinal: As above Genitourinary: Negative for dysuria. Musculoskeletal: Negative for back pain. Skin: Negative for rash. Neurological: Negative for headache   ____________________________________________   PHYSICAL EXAM:  VITAL SIGNS: ED Triage Vitals [01/15/20 1639]  Enc Vitals Group     BP 118/65     Pulse Rate 79     Resp 18     Temp 97.9 F (36.6 C)     Temp src      SpO2 98 %     Weight 65.3 kg (144 lb)     Height 1.6 m ('5\' 3"' )     Head Circumference      Peak Flow      Pain Score 6     Pain Loc      Pain Edu?      Excl. in [place]?     Constitutional: Alert and oriented.   Nose: No congestion/rhinnorhea. Mouth/Throat: Mucous membranes are moist.    Cardiovascular: Normal rate, regular rhythm. Grossly normal heart sounds.  Good peripheral circulation. Respiratory: Normal respiratory effort.  No retractions. Lungs CTAB. Gastrointestinal: Tenderness periumbilically, soft, no distention.  No CVA tenderness. Musculoskeletal: No lower extremity tenderness nor edema.  Warm and well perfused Neurologic:  Normal speech and language. No gross focal neurologic deficits are appreciated.  Skin:  Skin is warm, dry and intact. No rash noted. Psychiatric: Mood and affect are normal. Speech and behavior are normal.  ____________________________________________   LABS (all labs ordered are listed, but only abnormal results are displayed)  Labs Reviewed  COMPREHENSIVE METABOLIC  PANEL - Abnormal; Notable for the following components:      Result Value   Total Protein 8.4 (*)    All other components within normal limits  URINALYSIS, COMPLETE (UACMP) WITH MICROSCOPIC - Abnormal; Notable for the following components:   Color, Urine YELLOW (*)    APPearance CLEAR (*)    Bacteria, UA RARE (*)    All other components within normal limits  LIPASE, BLOOD  CBC  POCT PREGNANCY, URINE   ____________________________________________  EKG  None ____________________________________________  RADIOLOGY  CT abdomen pelvis demonstrates abnormal loops of jejunum most consistent with additional ileus or enteritis ____________________________________________   PROCEDURES  Procedure(s) performed: No  Procedures   Critical Care performed: No ____________________________________________   INITIAL IMPRESSION / ASSESSMENT AND PLAN / ED COURSE  Pertinent labs & imaging results  that were available during my care of the patient were reviewed by me and considered in my medical decision making (see chart for details).  Patient presents with periumbilical abdominal pain as described above.  Differential includes appendicitis, pancreatitis, gastritis, less likely cholecystitis given normal ultrasound 2 months ago  We will treat with IV morphine, IV Zofran  Lab work is notable for normal lipase, normal urinalysis, normal chemistries LFTs and CBC  CT abdomen pelvis demonstrates abnormal loops of jejunum borderline dilated suspicious for enteritis  Patient reports pain has completely resolved at this point.  Discussed with patient CT results, she has close follow-up with her gastroenterologist.  She agrees with discharge, return precautions discussed    ____________________________________________   FINAL CLINICAL IMPRESSION(S) / ED DIAGNOSES  Final diagnoses:  Enteritis        Note:  This document was prepared using Dragon voice recognition software and may  include unintentional dictation errors.   Lavonia Drafts, MD 01/15/20 2027    Lavonia Drafts, MD 01/15/20 2033

## 2020-01-15 NOTE — ED Triage Notes (Signed)
Patient complains of bilateral upper quadrant abdominal pain after eating papaya today. States it feels like her stomach is burning. Reports this also happened on 6/27.  Denies: ShOB, dysuria  OTC: none

## 2020-01-15 NOTE — ED Notes (Signed)
Patient is being discharged from the Urgent Care and sent to the Emergency Department via POV . Per Faustino Congress, NP, patient is in need of higher level of care due to excessive abdominal pain. Patient is aware and verbalizes understanding of plan of care.  Vitals:   01/15/20 1547  BP: 120/86  Pulse: 78  Resp: 16  Temp: 98.5 F (36.9 C)  SpO2: 97%

## 2020-01-15 NOTE — ED Notes (Signed)
Pt appears much more comfortable at this time. Pt states she does not have pain at this time

## 2020-01-15 NOTE — ED Triage Notes (Signed)
FIRST NURSE NOTE- here for abdominal pain after eating papaya. NAD

## 2020-01-15 NOTE — ED Triage Notes (Signed)
Pt comes via POV from home with c/o RLQ pain that has bee ongoing. Pt states some nausea. Pt states she has been seen for same  Thing but no improvement.

## 2020-01-15 NOTE — ED Provider Notes (Signed)
[place]   [id number] 01/15/20 Arrival Time: 9563  CC: ABDOMINAL PAIN  SUBJECTIVE:  Isabella Robertson is a 54 y.o. female who presents with complaint of abdominal discomfort that began abruptly about an hour ago. Denies a precipitating event, trauma, close contacts with similar symptoms, recent travel or antibiotic use.  Localizes the pain to generalized upper abdomen as well as the whole right side of her abdomen.  Reports that this is happened about 3 times, she has been seen in the ER for this and they did an ultrasound which shows hepatic steatosis.  They did not do a CT.  Patient reports that she does still have her gallbladder and her appendix.  Reports that her pain is a 9 out of 10 and that it is just not stopping.  Patient reports that she has taken Carafate today with no relief.  Reports the pain as burning and sharp.  Reports that she was eating lunch and as soon as she ate, this began.  Denies alleviating or aggravating factors. Reports similar symptoms in the past. Last BM today.    Denies fever, chills, appetite changes, weight changes, nausea, vomiting, chest pain, SOB, diarrhea, constipation, hematochezia, melena, dysuria, difficulty urinating, increased frequency or urgency, flank pain, loss of bowel or bladder function, vaginal discharge, vaginal odor, vaginal bleeding, dyspareunia, pelvic pain.     No LMP recorded. Patient has had a hysterectomy.  ROS: As per HPI.  All other pertinent ROS negative.     Past Medical History:  Diagnosis Date  . Asthma   . Chronic abdominal pain   . Chronic abdominal pain   . Chronic constipation   . Diffuse cystic mastopathy   . Diverticulitis   . Endometriosis    History of; Now s/p hysterectomy.  . Genetic screening 11/28/2012   negative /[facility]  . Helicobacter pylori (H. pylori) infection    x 3 per as of 06/2017   . IBS (irritable bowel syndrome)    diarrhea type per pt as of 06/2017    Past Surgical History:   Procedure Laterality Date  . ABDOMINAL HYSTERECTOMY  2007  . CESAREAN SECTION  2003  . COLONOSCOPY  2010  . COLONOSCOPY WITH PROPOFOL N/A 04/08/2017   Procedure: COLONOSCOPY WITH PROPOFOL;  Surgeon: Lollie Sails, MD;  Location: [facility] ENDOSCOPY;  Service: Endoscopy;  Laterality: N/A;  . ECTOPIC PREGNANCY SURGERY  [phone]  . EYE SURGERY  2003  . HERNIA REPAIR  age 53   Allergies  Allergen Reactions  . Shrimp [Shellfish Allergy]    No current facility-administered medications on file prior to encounter.   Current Outpatient Medications on File Prior to Encounter  Medication Sig Dispense Refill  . albuterol (VENTOLIN HFA) 108 (90 Base) MCG/ACT inhaler Inhale 1-2 puffs into the lungs every 4 (four) hours as needed for wheezing or shortness of breath. 6.7 g 0  . cetirizine (ZYRTEC) 10 MG tablet Take 10 mg daily as needed by mouth.    . dicyclomine (BENTYL) 20 MG tablet Take 1 tablet (20 mg total) by mouth 4 (four) times daily - after meals and at bedtime. 120 tablet 0  . fluticasone (FLONASE) 50 MCG/ACT nasal spray PLACE 1 SPRAY INTO BOTH NOSTRILS 2 (TWO) TIMES DAILY 16 g 2  . montelukast (SINGULAIR) 10 MG tablet Take 1 tablet (10 mg total) by mouth at bedtime. 30 tablet 3  . omeprazole (PRILOSEC) 40 MG capsule Take 1 capsule by mouth 2 (two) times daily.    Marland Kitchen  pantoprazole (PROTONIX) 40 MG tablet Take 1 tablet (40 mg total) by mouth daily. 30 tablet 1  . sucralfate (CARAFATE) 1 g tablet TAKE 1 TABLET (1 G TOTAL) BY MOUTH 4 (FOUR) TIMES DAILY - WITH MEALS AND AT BEDTIME. 360 tablet 1   Social History   Socioeconomic History  . Marital status: Married    Spouse name: Not on file  . Number of children: Not on file  . Years of education: Not on file  . Highest education level: Not on file  Occupational History  . Not on file  Tobacco Use  . Smoking status: Never Smoker  . Smokeless tobacco: Never Used  Substance and Sexual Activity  . Alcohol use: No    Alcohol/week: 0.0  standard drinks  . Drug use: No  . Sexual activity: Yes  Other Topics Concern  . Not on file  Social History Narrative  . Not on file   Social Determinants of Health   Financial Resource Strain:   . Difficulty of Paying Living Expenses:   Food Insecurity:   . Worried About Charity fundraiser in the Last Year:   . Arboriculturist in the Last Year:   Transportation Needs:   . Film/video editor (Medical):   Marland Kitchen Lack of Transportation (Non-Medical):   Physical Activity:   . Days of Exercise per Week:   . Minutes of Exercise per Session:   Stress:   . Feeling of Stress :   Social Connections:   . Frequency of Communication with Friends and Family:   . Frequency of Social Gatherings with Friends and Family:   . Attends Religious Services:   . Active Member of Clubs or Organizations:   . Attends Archivist Meetings:   Marland Kitchen Marital Status:   Intimate Partner Violence:   . Fear of Current or Ex-Partner:   . Emotionally Abused:   Marland Kitchen Physically Abused:   . Sexually Abused:    Family History  Problem Relation Age of Onset  . Breast cancer Paternal Aunt   . Breast cancer Mother 9  . Breast cancer Cousin        breast - paternal side     OBJECTIVE:  Vitals:   01/15/20 1547  BP: 120/86  Pulse: 78  Resp: 16  Temp: 98.5 F (36.9 C)  SpO2: 97%    General appearance: Alert; NAD HEENT: NCAT.  Oropharynx clear.  Lungs: clear to auscultation bilaterally without adventitious breath sounds Heart: regular rate and rhythm.  Radial pulses 2+ symmetrical bilaterally Abdomen: soft, non-distended; normal active bowel sounds; tender to light palpation across epigastrium into right lower quadrant; nontender at McBurney's point; negative Murphy's sign; negative rebound; no guarding Back: no CVA tenderness Extremities: no edema; symmetrical with no gross deformities Skin: warm and dry Neurologic: normal gait Psychological: alert and cooperative; normal mood and  affect  LABS: No results found for this or any previous visit (from the past 24 hour(s)).  DIAGNOSTIC STUDIES: No results found.   ASSESSMENT & PLAN:  1. Generalized abdominal pain    Presents with generalized abdominal pain.  Reports that the pain at her epigastrium is burning, and then reports that the rest of her stomach feels like it is having sharp pain and cramping On exam, patient is fidgeting and is unable to be still, she is visibly very uncomfortable, tearful Due to severity of her pain level, I do believe that she needs a higher level of care Instructed patient to follow-up  with the ER for further evaluation and treatment as she may need further imaging, also possible surgical repair Patient verbalized understanding is in agreement with treatment plan Stable at discharge To ER in personal vehicle    Faustino Congress, NP 01/15/20 1613

## 2020-01-18 ENCOUNTER — Telehealth: Payer: Self-pay

## 2020-01-18 NOTE — Telephone Encounter (Signed)
Patient contacted the office and states that she would like to become a patient of Dr. Darnell Level. She states she prefers a provider who can speak spanish. I did advise that Dr. Darnell Level was not taking new patient's at this time, but I would ask and see if he would make an exception for her. Please advise.

## 2020-01-21 ENCOUNTER — Telehealth: Payer: Self-pay | Admitting: Family Medicine

## 2020-01-21 NOTE — Telephone Encounter (Signed)
Ok to schedule in open 30 min slot.

## 2020-01-21 NOTE — Telephone Encounter (Signed)
See other phone note.  Thanks.  

## 2020-01-21 NOTE — Telephone Encounter (Signed)
I am fine with a TOC.

## 2020-01-21 NOTE — Telephone Encounter (Signed)
Appt has been scheduled.

## 2020-01-21 NOTE — Telephone Encounter (Signed)
Patient came into office stating she would like to be seen by Dr.Gutierrez. Did advise patient provider is not accepting at this time. Patient is requesting the TOC from Dr.Sonnenberg, as she would like to be able to speak Spanish with her provider as it is more comfortable for her. Please advise if able to schedule TOC.

## 2020-01-25 ENCOUNTER — Other Ambulatory Visit: Payer: Self-pay

## 2020-01-25 ENCOUNTER — Ambulatory Visit: Payer: PRIVATE HEALTH INSURANCE | Admitting: Family Medicine

## 2020-01-25 ENCOUNTER — Encounter: Payer: Self-pay | Admitting: Family Medicine

## 2020-01-25 VITALS — BP 112/78 | HR 94 | Temp 97.7°F | Ht 61.5 in | Wt 143.2 lb

## 2020-01-25 DIAGNOSIS — R1013 Epigastric pain: Secondary | ICD-10-CM

## 2020-01-25 DIAGNOSIS — R112 Nausea with vomiting, unspecified: Secondary | ICD-10-CM

## 2020-01-25 DIAGNOSIS — R109 Unspecified abdominal pain: Secondary | ICD-10-CM

## 2020-01-25 DIAGNOSIS — G8929 Other chronic pain: Secondary | ICD-10-CM

## 2020-01-25 DIAGNOSIS — Z8719 Personal history of other diseases of the digestive system: Secondary | ICD-10-CM

## 2020-01-25 DIAGNOSIS — J4520 Mild intermittent asthma, uncomplicated: Secondary | ICD-10-CM

## 2020-01-25 DIAGNOSIS — J309 Allergic rhinitis, unspecified: Secondary | ICD-10-CM

## 2020-01-25 DIAGNOSIS — K580 Irritable bowel syndrome with diarrhea: Secondary | ICD-10-CM | POA: Diagnosis not present

## 2020-01-25 DIAGNOSIS — Z8619 Personal history of other infectious and parasitic diseases: Secondary | ICD-10-CM

## 2020-01-25 DIAGNOSIS — R1084 Generalized abdominal pain: Secondary | ICD-10-CM

## 2020-01-25 DIAGNOSIS — K219 Gastro-esophageal reflux disease without esophagitis: Secondary | ICD-10-CM

## 2020-01-25 DIAGNOSIS — Z8711 Personal history of peptic ulcer disease: Secondary | ICD-10-CM

## 2020-01-25 DIAGNOSIS — R110 Nausea: Secondary | ICD-10-CM

## 2020-01-25 DIAGNOSIS — J45909 Unspecified asthma, uncomplicated: Secondary | ICD-10-CM | POA: Insufficient documentation

## 2020-01-25 MED ORDER — SUCRALFATE 1 G PO TABS: 1.0000 g | ORAL_TABLET | Freq: Three times a day (TID) | ORAL | 1 refills | Status: DC

## 2020-01-25 NOTE — Progress Notes (Signed)
This visit was conducted in person.  BP 112/78 (BP Location: Left Arm, Patient Position: Sitting, Cuff Size: Normal)   Pulse 94   Temp 97.7 F (36.5 C) (Temporal)   Ht 5' 1.5" (1.562 m)   Wt 143 lb 4 oz (65 kg)   SpO2 96%   BMI 26.63 kg/m    CC: new pt to establish - transfer care from [facility] [employer] [facility]. Subjective:    Patient ID: [name], female    DOB: June 27, 1966, 54 y.o.   MRN: [id number]  HPI: Saudia Smyser is a 54 y.o. female presenting on 01/25/2020 for New Patient (Initial Visit) Lyndee Leo)  Previously saw Dr Caryl Bis. Would like spanish speaking provider.   Longstanding abdominal issues presumed due to abdominal adhesions - previously unable to complete colonoscopy due to tortuous colon after adhesions.  Colonoscopy 2018 with Dr. Gustavo Lah: incomplete due to sharp angulation at 42-44 cm from the anal verge, diverticulosis in the sigmoid and descending colon. Biopsies taken from the left colon to exclude microscopic colitis. Biopsies were normal. Had reassuring virtual colonoscopy 2018.  Years of chronic abdominal pain treated with dicyclomine.  ?h/o IBS, lactose intolerance. H/o gastric ulcer. H/o H pylori s/p treatment x3.  Carafate may have helped.  Also treated with PPI and bentyl with significant benefit, this seems to have lost effect.   S/p multiple surgeries - 2 ectopics, myomectomy, hysterectomy, laparoscopic LOA and caesarian section. Ovaries remain. Notes few hot flashes - likely menopausal or peri-menopausal.   Episodes of worsening abdominal discomfort/pressure progressing to pain associated with reflux, nausea. Vomiting relieves symptoms. Sometimes has to induce emesis. Pain can get severe.  Intermittent, occur about once a month.  Has seen ER for this twice (11/2019, 01/2020).   Upcoming appt with GI 02/04/2020 (Dr Tarri Glenn). Previously saw Dr Gustavo Lah.  Has seen gen surg Dr Windell Moment.   Last CPE - unsure, due.      Relevant  past medical, surgical, family and social history reviewed and updated as indicated. Interim medical history since our last visit reviewed. Allergies and medications reviewed and updated. Outpatient Medications Prior to Visit  Medication Sig Dispense Refill  . albuterol (VENTOLIN HFA) 108 (90 Base) MCG/ACT inhaler Inhale 1-2 puffs into the lungs every 4 (four) hours as needed for wheezing or shortness of breath. 6.7 g 0  . cetirizine (ZYRTEC) 10 MG tablet Take 10 mg daily as needed by mouth.    . fluticasone (FLONASE) 50 MCG/ACT nasal spray PLACE 1 SPRAY INTO BOTH NOSTRILS 2 (TWO) TIMES DAILY 16 g 2  . montelukast (SINGULAIR) 10 MG tablet Take 1 tablet (10 mg total) by mouth at bedtime. 30 tablet 3  . omeprazole (PRILOSEC) 40 MG capsule Take 1 capsule (40 mg total) by mouth 2 (two) times daily.    Marland Kitchen dicyclomine (BENTYL) 20 MG tablet Take 1 tablet (20 mg total) by mouth 4 (four) times daily - after meals and at bedtime. 120 tablet 0  . omeprazole (PRILOSEC) 40 MG capsule Take 1 capsule by mouth 2 (two) times daily.    . pantoprazole (PROTONIX) 40 MG tablet Take 1 tablet (40 mg total) by mouth daily. 30 tablet 1  . sucralfate (CARAFATE) 1 g tablet TAKE 1 TABLET (1 G TOTAL) BY MOUTH 4 (FOUR) TIMES DAILY - WITH MEALS AND AT BEDTIME. 360 tablet 1  . traMADol (ULTRAM) 50 MG tablet Take 1 tablet (50 mg total) by mouth every 6 (six) hours as needed. 20 tablet 0   No  facility-administered medications prior to visit.     Per HPI unless specifically indicated in ROS section below Review of Systems Objective:  BP 112/78 (BP Location: Left Arm, Patient Position: Sitting, Cuff Size: Normal)   Pulse 94   Temp 97.7 F (36.5 C) (Temporal)   Ht 5' 1.5" (1.562 m)   Wt 143 lb 4 oz (65 kg)   SpO2 96%   BMI 26.63 kg/m   Wt Readings from Last 3 Encounters:  01/25/20 143 lb 4 oz (65 kg)  01/15/20 144 lb (65.3 kg)  11/19/19 139 lb 15.9 oz (63.5 kg)      Physical Exam Vitals and nursing note reviewed.    Neck:     Thyroid: No thyroid mass, thyromegaly or thyroid tenderness.  Cardiovascular:     Rate and Rhythm: Normal rate and regular rhythm.     Pulses: Normal pulses.     Heart sounds: Normal heart sounds. No murmur heard.   Pulmonary:     Effort: Pulmonary effort is normal. No respiratory distress.     Breath sounds: Normal breath sounds. No wheezing, rhonchi or rales.  Abdominal:     General: Abdomen is flat. Bowel sounds are normal. There is no distension.     Palpations: Abdomen is soft. There is no mass.     Tenderness: There is abdominal tenderness (mild) in the right lower quadrant, epigastric area, suprapubic area and left lower quadrant. There is no right CVA tenderness, left CVA tenderness, guarding or rebound. Negative signs include Murphy's sign.     Hernia: No hernia is present.  Psychiatric:        Behavior: Behavior normal.       Results for orders placed or performed during the hospital encounter of 01/15/20  Lipase, blood  Result Value Ref Range   Lipase 27 11 - 51 U/L  Comprehensive metabolic panel  Result Value Ref Range   Sodium 137 135 - 145 mmol/L   Potassium 4.0 3.5 - 5.1 mmol/L   Chloride 102 98 - 111 mmol/L   CO2 26 22 - 32 mmol/L   Glucose, Bld 97 70 - 99 mg/dL   BUN 13 6 - 20 mg/dL   Creatinine, Ser 0.97 0.44 - 1.00 mg/dL   Calcium 9.4 8.9 - 10.3 mg/dL   Total Protein 8.4 (H) 6.5 - 8.1 g/dL   Albumin 4.9 3.5 - 5.0 g/dL   AST 27 15 - 41 U/L   ALT 27 0 - 44 U/L   Alkaline Phosphatase 54 38 - 126 U/L   Total Bilirubin 1.0 0.3 - 1.2 mg/dL   GFR calc non Af Amer >60 >60 mL/min   GFR calc Af Amer >60 >60 mL/min   Anion gap 9 5 - 15  CBC  Result Value Ref Range   WBC 5.3 4.0 - 10.5 K/uL   RBC 4.84 3.87 - 5.11 MIL/uL   Hemoglobin 14.9 12.0 - 15.0 g/dL   HCT 43.3 36 - 46 %   MCV 89.5 80.0 - 100.0 fL   MCH 30.8 26.0 - 34.0 pg   MCHC 34.4 30.0 - 36.0 g/dL   RDW 12.2 11.5 - 15.5 %   Platelets 281 150 - 400 K/uL   nRBC 0.0 0.0 - 0.2 %   Urinalysis, Complete w Microscopic  Result Value Ref Range   Color, Urine YELLOW (A) YELLOW   APPearance CLEAR (A) CLEAR   Specific Gravity, Urine 1.014 1.005 - 1.030   pH 8.0 5.0 - 8.0  Glucose, UA NEGATIVE NEGATIVE mg/dL   Hgb urine dipstick NEGATIVE NEGATIVE   Bilirubin Urine NEGATIVE NEGATIVE   Ketones, ur NEGATIVE NEGATIVE mg/dL   Protein, ur NEGATIVE NEGATIVE mg/dL   Nitrite NEGATIVE NEGATIVE   Leukocytes,Ua NEGATIVE NEGATIVE   RBC / HPF 0-5 0 - 5 RBC/hpf   WBC, UA 0-5 0 - 5 WBC/hpf   Bacteria, UA RARE (A) NONE SEEN   Squamous Epithelial / LPF 0-5 0 - 5  Pregnancy, urine POC  Result Value Ref Range   Preg Test, Ur NEGATIVE NEGATIVE   Assessment & Plan:  This visit occurred during the SARS-CoV-2 public health emergency.  Safety protocols were in place, including screening questions prior to the visit, additional usage of staff PPE, and extensive cleaning of exam room while observing appropriate contact time as indicated for disinfecting solutions.   Problem List Items Addressed This Visit    Nausea and vomiting   Irritable bowel syndrome with diarrhea    Managed with dicyclomine.  Symptomatic episodes more severe than just IBS.       Relevant Medications   sucralfate (CARAFATE) 1 g tablet   omeprazole (PRILOSEC) 40 MG capsule   History of Helicobacter pylori infection    S/p treatment x3      History of gastric ulcer    Currently off PPI.       Relevant Medications   sucralfate (CARAFATE) 1 g tablet   GERD (gastroesophageal reflux disease)    Omeprazole hasn't helped episodes of severe abd pain, but has helped prior GERD. Currently not on PPI.       Relevant Medications   sucralfate (CARAFATE) 1 g tablet   omeprazole (PRILOSEC) 40 MG capsule   Epigastric abdominal pain   Chronic abdominal pain - Primary    Episodes of abdominal discomfort/pressure with nausea and reflux progressing to severe pain and bloating only relieved after vomiting - in h/o IBS,  GERD, PUD and known adhesions. ?developing achalasia vs other Previous treatments tried to date not very effective. Will continue carafate and dicyclomine.  Pending establishing with new GI, has previously seen gen surgery.       Relevant Medications   sucralfate (CARAFATE) 1 g tablet   Asthma    Continues PRN albuterol along with singulair and flonase.       Allergic rhinitis    Continues PRN albuterol along with zyrtec, singulair and flonase.           Meds ordered this encounter  Medications  . sucralfate (CARAFATE) 1 g tablet    Sig: Take 1 tablet (1 g total) by mouth 4 (four) times daily -  with meals and at bedtime.    Dispense:  60 tablet    Refill:  1   No orders of the defined types were placed in this encounter.   Patient Instructions  Gusto verla hoy A seguir carafate pastilla como la necesite.  Estoy de acuerdo que vea al Brewing technologist.  Schedule physical at your convenience, prior for fasting labs.  regresar en 3 meses para proximo examen fisico.    Follow up plan: Return in about 3 months (around 04/26/2020), or if symptoms worsen or fail to improve, for annual exam, prior fasting for blood work.  Ria Bush, MD

## 2020-01-25 NOTE — Patient Instructions (Addendum)
Gusto verla hoy A seguir carafate pastilla como la necesite.  Estoy de acuerdo que vea al Brewing technologist.  Schedule physical at your convenience, prior for fasting labs.  regresar en 3 meses para proximo examen fisico.

## 2020-01-27 NOTE — Assessment & Plan Note (Signed)
Omeprazole hasn't helped episodes of severe abd pain, but has helped prior GERD. Currently not on PPI.

## 2020-01-27 NOTE — Assessment & Plan Note (Addendum)
Episodes of abdominal discomfort/pressure with nausea and reflux progressing to severe pain and bloating only relieved after vomiting - in h/o IBS, GERD, PUD and known adhesions. ?developing achalasia vs other Previous treatments tried to date not very effective. Will continue carafate and dicyclomine.  Pending establishing with new GI, has previously seen gen surgery.

## 2020-01-27 NOTE — Assessment & Plan Note (Signed)
Currently off PPI.

## 2020-01-27 NOTE — Assessment & Plan Note (Addendum)
Continues PRN albuterol along with zyrtec, singulair and flonase.

## 2020-01-27 NOTE — Assessment & Plan Note (Signed)
S/p treatment x3

## 2020-01-27 NOTE — Assessment & Plan Note (Signed)
Managed with dicyclomine.  Symptomatic episodes more severe than just IBS.

## 2020-01-27 NOTE — Assessment & Plan Note (Signed)
Continues PRN albuterol along with singulair and flonase.

## 2020-02-04 ENCOUNTER — Encounter: Payer: Self-pay | Admitting: Gastroenterology

## 2020-02-04 ENCOUNTER — Ambulatory Visit: Payer: PRIVATE HEALTH INSURANCE | Admitting: Gastroenterology

## 2020-02-04 VITALS — BP 118/78 | HR 81 | Ht 63.0 in

## 2020-02-04 DIAGNOSIS — R935 Abnormal findings on diagnostic imaging of other abdominal regions, including retroperitoneum: Secondary | ICD-10-CM | POA: Diagnosis not present

## 2020-02-04 DIAGNOSIS — R140 Abdominal distension (gaseous): Secondary | ICD-10-CM

## 2020-02-04 DIAGNOSIS — R109 Unspecified abdominal pain: Secondary | ICD-10-CM

## 2020-02-04 DIAGNOSIS — R112 Nausea with vomiting, unspecified: Secondary | ICD-10-CM

## 2020-02-04 MED ORDER — NA SULFATE-K SULFATE-MG SULF 17.5-3.13-1.6 GM/177ML PO SOLN
ORAL | 0 refills | Status: DC
Start: 2020-02-04 — End: 2020-04-08

## 2020-02-04 NOTE — Patient Instructions (Addendum)
If you are age 54 or older, your body mass index should be between 23-30. Your Body mass index is 25.38 kg/m. If this is out of the aforementioned range listed, please consider follow up with your Primary Care Provider.  If you are age 58 or younger, your body mass index should be between 19-25. Your Body mass index is 25.38 kg/m. If this is out of the aformentioned range listed, please consider follow up with your Primary Care Provider.   I have recommended an upper endoscopy with gastric and duodenal biopsies.  Will attempt a colonoscopy at that time using a pediatric colonoscope.   I have recommended a breath test for small intestinal bacterial overgrowth.  I am recommending a trial of FDGard - 2 capsules taken twice daily for 4 weeks.  Please take FDGard 30 to 60 minutes before meals with water.  There is no distinct pattern of side effects with FDGard, but, sometimes patients experience a mild tingling sensation in the gut within the first 30 minutes. This sensation should subsequently subside. (Samples given)  You have been given a testing kit to check for small intestine bacterial overgrowth (SIBO) which is completed by a company named [facility]. Make sure to return your test in the mail using the return mailing label given you along with the kit. Your demographic and insurance information have already been sent to the company and they should be in contact with you over the next week regarding this test. Please keep in mind that you will be getting a call from phone number [phone] or a similar number. If you do not hear from them within this time frame, please call our office at [phone].   Due to recent changes in healthcare laws, you may see the results of your imaging and laboratory studies on MyChart before your provider has had a chance to review them.  We understand that in some cases there may be results that are confusing or concerning to you. Not all laboratory results  come back in the same time frame and the provider may be waiting for multiple results in order to interpret others.  Please give [place] 48 hours in order for your provider to thoroughly review all the results before contacting the office for clarification of your results.

## 2020-02-04 NOTE — Progress Notes (Signed)
Referring Provider: [name], MD Primary Care Physician:  Ria Bush, MD  Reason for Consultation:  Abdominal pain  IMPRESSION:  Abdominal pain, gas, nausea, early satiety Abnormal CT scan 01/15/20    - Abnormal loops of jejunum containing frothy material and borderline dilated up to 2.8 cm    - slow transition to normal caliber mid small bowel and a normal appearance of the ileum    - suspected jejunal ileus or enteritis. 2. Normal appendix.  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  HPI: Isabella Robertson is a 54 y.o. female referred by Dr. Danise Mina for further evaluation of abdominal pain.  The history is obtained through the patient, referral records,  and review of her electronic health record. She has allergic rhinitis, asthma, GERD, a history of H pylori PUD, hyperlipidemia, and diffuse cystic mastopathy. History of c section, laparoscopic evaluation for endometriosis, vaginal hysterectomy, and LOA.   She completed the Covid vaccine. She is a silver Production assistant, radio at [employer].   Reviewed 27 pages of records.  Previously followed at the [facility]. Last seen 08/18/18.   Years of chronic lower abdominal pain since her 68s that improved with dicyclomine.  Diagnosed with IBS and lactose intolerance. Diarrhea that is slowed with Carafate.  History of H pylori by stool antigen  2015. Treated with Pylera.  Reference in referral records to treatment two times prior to Pylera.  Known diverticulosis without diverticulitis.  Today she reports lower chest and upper abdominal pain. Sharp, awakes her from sleep.  Feels like intestines were sticky after hysterectomy. Incomplete colonoscopy 2018.  Symptoms start as soon as she starts eating Feels like food sits in her stomach. Feels better after emesis.  Occasional nausea, early satiety, gas  Feels like she needs to belch No diarrhea, constipation, or change in bowel habits. No blood or mucous in the stool. Omeprazole 20 mg daily has not relieved her symptoms.  Dicyclomine helped with the gas in the past, although now it gives her nausea and she doesn't like it any more  Prior work-up included CT abd/pelvis 2015 for abdominal pain: tiny left inguinal hernia, no reason for pain identified EGD 2007 with H pylori gastritis Colonoscopy 2010 incomplete due to tortuous colon Colonoscopy 2018 with Dr. Gustavo Lah: incomplete due to sharp angulation at 42-44 cm from the anal verge, diverticulosis in the sigmoid and descending colon. Biopsies taken from the left colon to exclude microscopic colitis. Biopsies were normal.  She was under the impression that she needed a surgeon to be present at the time of CT colonoscopy 10/18: no polyps. No acute abnormality.   Recent evaluation:  CT abd/pelvis 01/15/20: 1. Abnormal loops of jejunum containing frothy material and borderline dilated up to 2.8 cm, with a slow transition to normal caliber mid small bowel and a normal appearance of the ileum. Given the lack of an obvious point of obstruction, this may reflect jejunal ileus or enteritis. 2. Normal appendix. 3. Transverse duodenal diverticulum  without findings of Inflammation. 4. Linear subsegmental atelectasis or scarring in the posterior basal segment right lower lobe.  Labs 01/15/20: normal CMP, normal lipase, normal CBC  Organic soy  milk Bloating with any milk products Beans and tomoators trigger the symptoms  No known family history of colon cancer or polyps. No family history of uterine/endometrial cancer, pancreatic cancer or gastric/stomach cancer.   Past Medical History:  Diagnosis Date  . Asthma    triggers are dust, smoke, weather changes  . Chronic abdominal pain   . Diffuse cystic mastopathy   . Diverticulitis   . Endometriosis    History of; Now s/p hysterectomy.  . Genetic screening 11/28/2012   negative /[facility]  . Helicobacter pylori (H. pylori) infection    x 3 per as of 06/2017   . IBS (irritable bowel syndrome)    diarrhea type per pt as of 06/2017     Past Surgical History:  Procedure Laterality Date  . ABDOMINAL HYSTERECTOMY  2007   ovaries remain  . CESAREAN SECTION  2003  . COLONOSCOPY  2010  . COLONOSCOPY WITH PROPOFOL N/A 04/08/2017   Procedure: COLONOSCOPY WITH PROPOFOL;  Surgeon: Lollie Sails, MD;  Location: [facility] ENDOSCOPY;  Service: Endoscopy;  Laterality: N/A;  . ECTOPIC PREGNANCY SURGERY  6578,4696  . EYE SURGERY  2003  . HERNIA REPAIR  2952   umbilical    Current Outpatient Medications  Medication Sig Dispense Refill  . sucralfate (CARAFATE) 1 g tablet Take 1 tablet (1 g total) by mouth 4 (four) times daily -  with meals and at bedtime. 60 tablet 1  . albuterol (VENTOLIN HFA) 108 (90 Base) MCG/ACT inhaler Inhale 1-2 puffs into the lungs every 4 (four) hours as needed for wheezing or shortness of breath. (Patient not taking: Reported on 02/04/2020) 6.7 g 0  . cetirizine (ZYRTEC) 10 MG tablet Take 10 mg daily as needed by mouth. (Patient not taking: Reported on 02/04/2020)    . fluticasone (FLONASE) 50 MCG/ACT nasal spray PLACE 1 SPRAY INTO BOTH NOSTRILS 2 (TWO) TIMES DAILY (Patient not taking: Reported on 02/04/2020) 16 g 2  . montelukast (SINGULAIR) 10 MG tablet Take 1 tablet (10 mg total) by mouth at bedtime. (Patient not taking: Reported on 02/04/2020) 30 tablet 3  .  omeprazole (PRILOSEC) 40 MG capsule Take 1 capsule (40 mg total) by mouth 2 (two) times daily. (Patient not taking: Reported on 02/04/2020)     No current facility-administered medications for this visit.    Allergies as of 02/04/2020 - Review Complete 02/04/2020  Allergen Reaction Noted  . Shrimp [shellfish allergy]  04/07/2017    Family History  Problem Relation Age of Onset  . Breast cancer Paternal Aunt   . Breast cancer Mother 42  . High blood pressure Mother   . Breast cancer Cousin        breast - paternal side  . Heart attack Father 70  . High blood pressure Father   . Colon cancer Neg Hx   . Stomach cancer Neg Hx   . Esophageal cancer Neg Hx   . Pancreatic cancer Neg Hx     Social History   Socioeconomic History  . Marital status: Married    Spouse name: Not on file  . Number of children: Not on file  . Years of education: Not on file  . Highest education level: Not on file  Occupational History  . Not on file  Tobacco Use  . Smoking status: Never Smoker  .  Smokeless tobacco: Never Used  Vaping Use  . Vaping Use: Never used  Substance and Sexual Activity  . Alcohol use: No    Alcohol/week: 0.0 standard drinks  . Drug use: No  . Sexual activity: Yes  Other Topics Concern  . Not on file  Social History Narrative  . Not on file   Social Determinants of Health   Financial Resource Strain:   . Difficulty of Paying Living Expenses:   Food Insecurity:   . Worried About Charity fundraiser in the Last Year:   . Arboriculturist in the Last Year:   Transportation Needs:   . Film/video editor (Medical):   Marland Kitchen Lack of Transportation (Non-Medical):   Physical Activity:   . Days of Exercise per Week:   . Minutes of Exercise per Session:   Stress:   . Feeling of Stress :   Social Connections:   . Frequency of Communication with Friends and Family:   . Frequency of Social Gatherings with Friends and Family:   . Attends Religious Services:   . Active  Member of Clubs or Organizations:   . Attends Archivist Meetings:   Marland Kitchen Marital Status:   Intimate Partner Violence:   . Fear of Current or Ex-Partner:   . Emotionally Abused:   Marland Kitchen Physically Abused:   . Sexually Abused:     Review of Systems: 12 system ROS is negative except as noted above.   Physical Exam: General:   Alert,  well-nourished, pleasant and cooperative in NAD Head:  Normocephalic and atraumatic. Eyes:  Sclera clear, no icterus.   Conjunctiva pink. Ears:  Normal auditory acuity. Nose:  No deformity, discharge,  or lesions. Mouth:  No deformity or lesions.   Neck:  Supple; no masses or thyromegaly. Lungs:  Clear throughout to auscultation.   No wheezes. Heart:  Regular rate and rhythm; no murmurs. Abdomen:  Soft,nontender, nondistended, normal bowel sounds, no rebound or guarding. No hepatosplenomegaly.   Rectal:  Deferred  Msk:  Symmetrical. No boney deformities LAD: No inguinal or umbilical LAD Extremities:  No clubbing or edema. Neurologic:  Alert and  oriented x4;  grossly nonfocal Skin:  Intact without significant lesions or rashes. Psych:  Alert and cooperative. Normal mood and affect.    Orenthal Debski L. Tarri Glenn, MD, MPH 02/04/2020, 2:44 PM

## 2020-02-24 ENCOUNTER — Encounter: Payer: Self-pay | Admitting: Gastroenterology

## 2020-03-25 ENCOUNTER — Telehealth: Payer: Self-pay | Admitting: Gastroenterology

## 2020-03-25 NOTE — Telephone Encounter (Signed)
Lactulose Breath test results reviewed. Bacterial overgrowth is suspected, although elevation does not truly occur until large intestines. Trial of Xifaxan 550 mg TID x 14 days could be considered given her symptoms. If Xifaxan is cost prohibitive, could substitute with doxycycline 100 mg BID x 14 days. I still recommend that she proceed with endoscopy as planned. Thank you.

## 2020-03-26 MED ORDER — RIFAXIMIN 200 MG PO TABS: 200.0000 mg | ORAL_TABLET | Freq: Three times a day (TID) | ORAL | 0 refills | Status: DC

## 2020-03-26 NOTE — Telephone Encounter (Signed)
Attempted to call pt, phone rings x2 then hangs up.

## 2020-03-26 NOTE — Addendum Note (Signed)
Addended by: Rosanne Sack R on: 03/26/2020 10:15 AM   Modules accepted: Orders

## 2020-03-26 NOTE — Telephone Encounter (Signed)
Spoke with pt and she is aware. Xifaxan script sent to the pharmacy. Pt knows to call back if it is to expensive and we will send in a different script.

## 2020-04-04 ENCOUNTER — Ambulatory Visit: Payer: Self-pay | Admitting: *Deleted

## 2020-04-04 ENCOUNTER — Other Ambulatory Visit: Payer: Self-pay

## 2020-04-04 DIAGNOSIS — R300 Dysuria: Secondary | ICD-10-CM

## 2020-04-04 DIAGNOSIS — N3001 Acute cystitis with hematuria: Secondary | ICD-10-CM

## 2020-04-04 HISTORY — PX: ESOPHAGOGASTRODUODENOSCOPY: SHX1529

## 2020-04-04 HISTORY — PX: COLONOSCOPY: SHX174

## 2020-04-04 LAB — POCT URINALYSIS DIPSTICK
Bilirubin, UA: NEGATIVE
Glucose, UA: NEGATIVE mg/dL
Ketones, POC UA: NEGATIVE mg/dL
Nitrite, UA: POSITIVE — AB
Specific Gravity, UA: 1.01 (ref 1.005–1.030)
Urobilinogen, UA: 0.2 E.U./dL
pH, UA: 6 (ref 5.0–8.0)

## 2020-04-04 MED ORDER — NITROFURANTOIN MONOHYD MACRO 100 MG PO CAPS
100.0000 mg | ORAL_CAPSULE | Freq: Two times a day (BID) | ORAL | 0 refills | Status: AC
Start: 2020-04-04 — End: 2020-04-11

## 2020-04-04 NOTE — Progress Notes (Signed)
Pt in to clinic c/o UTI sx x3 days. Urinary frequency, urgency, sensation of not emptying bladder fully, low back pain and painful/burning urination. Started AZO yesterday but no relief with this. UA dip collected with AZO on board.   Per chart review, blood and leukocytes present on UA dip in May 2021 with abd pain evaluation. Urine Cx was "mixed urogenital flora, less than 10k colonies/ml." No abx treatment at that time.   Prior to that, was last treated for UTI in Dec 2018. At that time, also had leukocytes and blood present in UA. Treated with Bactrim. Leukocytes and blood still present on UA one month later. Was then treated with Nitrofurantoin and sent for culture. At that time she was only having dysuria, denied flank or back pain, urinary urgency/frequency. Culture came back as "no growth."  Pt having colonoscopy performed 04/09/11 with [facility]. Asked if they can proceed with procedure. Advised pt she would need to contact their office directly to inquire.  Case discussed with NP Betancourt. Orders received for Macrobid 100mg  po BID x7days #14 RF0. Sent to pharmacy of choice.

## 2020-04-07 NOTE — Progress Notes (Signed)
Spoke with pt by phone. She reports sx are improving, though still present. Urine culture results still pending. She does report that GI office told her she can take abx through today but skip tomorrow's dose, day of colonoscopy. She also informs RN that she was prescribed Xifaxan x14 days for her IBS. Per chart review, that was 9/21 but she has not picked up yet. She was planning to pick up today and take with her to procedure tomorrow to discuss with MD. Advised her to discuss both abx with MD and start, d/c on their recommendations and that they can view culture results in [facility] if needed tomorrow. Pt verbalizes understanding and agreement. No further questions/concerns.

## 2020-04-08 ENCOUNTER — Ambulatory Visit (AMBULATORY_SURGERY_CENTER): Payer: PRIVATE HEALTH INSURANCE | Admitting: Gastroenterology

## 2020-04-08 ENCOUNTER — Encounter: Payer: Self-pay | Admitting: Gastroenterology

## 2020-04-08 ENCOUNTER — Other Ambulatory Visit: Payer: Self-pay

## 2020-04-08 VITALS — BP 125/85 | HR 70 | Temp 98.6°F | Resp 20 | Ht 63.0 in | Wt 143.0 lb

## 2020-04-08 DIAGNOSIS — R112 Nausea with vomiting, unspecified: Secondary | ICD-10-CM

## 2020-04-08 DIAGNOSIS — Z1211 Encounter for screening for malignant neoplasm of colon: Secondary | ICD-10-CM

## 2020-04-08 DIAGNOSIS — K2970 Gastritis, unspecified, without bleeding: Secondary | ICD-10-CM | POA: Diagnosis not present

## 2020-04-08 DIAGNOSIS — K31A11 Gastric intestinal metaplasia without dysplasia, involving the antrum: Secondary | ICD-10-CM | POA: Diagnosis not present

## 2020-04-08 DIAGNOSIS — R935 Abnormal findings on diagnostic imaging of other abdominal regions, including retroperitoneum: Secondary | ICD-10-CM

## 2020-04-08 DIAGNOSIS — R109 Unspecified abdominal pain: Secondary | ICD-10-CM

## 2020-04-08 DIAGNOSIS — K219 Gastro-esophageal reflux disease without esophagitis: Secondary | ICD-10-CM

## 2020-04-08 DIAGNOSIS — K2950 Unspecified chronic gastritis without bleeding: Secondary | ICD-10-CM | POA: Diagnosis not present

## 2020-04-08 DIAGNOSIS — K3189 Other diseases of stomach and duodenum: Secondary | ICD-10-CM | POA: Diagnosis not present

## 2020-04-08 LAB — URINE CULTURE

## 2020-04-08 MED ORDER — SODIUM CHLORIDE 0.9 % IV SOLN: 500.0000 mL | Freq: Once | INTRAVENOUS | Status: DC

## 2020-04-08 NOTE — Op Note (Signed)
[place] Patient Name: Isabella Robertson Procedure Date: 04/08/2020 10:27 AM MRN: [id number] Endoscopist: Thornton Park MD, MD Age: 54 Referring MD:  Date of Birth: 1965-10-21 Gender: Female Account #: [id number] Procedure:                Colonoscopy Indications:              Screening for colorectal malignant neoplasm                           History of incomplete colonoscopy 2010 and 2018                            with Dr. Gustavo Lah                           No known family history of colon cancer or polyps Medicines:                Monitored Anesthesia Care Procedure:                Pre-Anesthesia Assessment:                           - Prior to the procedure, a History and Physical                            was performed, and patient medications and                            allergies were reviewed. The patient's tolerance of                            previous anesthesia was also reviewed. The risks                            and benefits of the procedure and the sedation                            options and risks were discussed with the patient.                            All questions were answered, and informed consent                            was obtained. Prior Anticoagulants: The patient has                            taken no previous anticoagulant or antiplatelet                            agents. ASA Grade Assessment: II - A patient with                            mild systemic disease. After reviewing the risks  and benefits, the patient was deemed in                            satisfactory condition to undergo the procedure.                           After obtaining informed consent, the colonoscope                            was passed under direct vision. Throughout the                            procedure, the patient's blood pressure, pulse, and                            oxygen saturations were monitored  continuously. The                            Colonoscope was introduced through the anus and                            advanced to the 3 cm into the ileum. A second                            forward view of the right colon was performed. The                            colonoscopy was performed with difficulty due to                            restricted mobility of the colon at the                            rectosigmoid junction. Successful completion of the                            procedure was aided by applying abdominal pressure.                            The patient tolerated the procedure well. The                            quality of the bowel preparation was good. The                            terminal ileum, ileocecal valve, appendiceal                            orifice, and rectum were photographed. Scope In: 10:46:57 AM Scope Out: 11:03:32 AM Scope Withdrawal Time: 0 hours 13 minutes 37 seconds  Total Procedure Duration: 0 hours 16 minutes 35 seconds  Findings:                 Non-bleeding external and internal hemorrhoids were  found.                           A few small-mouthed diverticula were found in the                            sigmoid colon and descending colon.                           The exam was otherwise without abnormality on                            direct and retroflexion views. Complications:            No immediate complications. Estimated Blood Loss:     Estimated blood loss: none. Impression:               - Non-bleeding external and internal hemorrhoids.                           - Diverticulosis in the sigmoid colon and in the                            descending colon.                           - The examination was otherwise normal on direct                            and retroflexion views.                           - No specimens collected. Recommendation:           - Patient has a contact number available for                             emergencies. The signs and symptoms of potential                            delayed complications were discussed with the                            patient. Return to normal activities tomorrow.                            Written discharge instructions were provided to the                            patient.                           - Resume previous diet. High fiber diet recommended.                           - Continue present medications.                           -  Repeat colonoscopy in 10 years for surveillance.                           - Emerging evidence supports eating a diet of                            fruits, vegetables, grains, calcium, and yogurt                            while reducing red meat and alcohol may reduce the                            risk of colon cancer.                           - Thank you for allowing me to be involved in your                            colon cancer prevention. Thornton Park MD, MD 04/08/2020 11:16:15 AM This report has been signed electronically.

## 2020-04-08 NOTE — Progress Notes (Signed)
pt tolerated well. VSS. awake and to recovery. Report given to RN. Bite block inserted and removed without trauma. 

## 2020-04-08 NOTE — Progress Notes (Signed)
Noted still having symptoms, colonoscopy scheduled for tomorrow and patient to follow up with GI provider regarding medications administration prior to procedure.

## 2020-04-08 NOTE — Progress Notes (Signed)
VS-CW 

## 2020-04-08 NOTE — Progress Notes (Signed)
Called to room to assist during endoscopic procedure.  Patient ID and intended procedure confirmed with present staff. Received instructions for my participation in the procedure from the performing physician.  

## 2020-04-08 NOTE — Patient Instructions (Signed)
Handouts Provided:  Gastritis  NO aspirin, ibuprofen, naproxen, or other non-steroidal anti-inflammatory drugs.  YOU HAD AN ENDOSCOPIC PROCEDURE TODAY AT [facility]:   Refer to the procedure report that was given to you for any specific questions about what was found during the examination.  If the procedure report does not answer your questions, please call your gastroenterologist to clarify.  If you requested that your care partner not be given the details of your procedure findings, then the procedure report has been included in a sealed envelope for you to review at your convenience later.  YOU SHOULD EXPECT: Some feelings of bloating in the abdomen. Passage of more gas than usual.  Walking can help get rid of the air that was put into your GI tract during the procedure and reduce the bloating. If you had a lower endoscopy (such as a colonoscopy or flexible sigmoidoscopy) you may notice spotting of blood in your stool or on the toilet paper. If you underwent a bowel prep for your procedure, you may not have a normal bowel movement for a few days.  Please Note:  You might notice some irritation and congestion in your nose or some drainage.  This is from the oxygen used during your procedure.  There is no need for concern and it should clear up in a day or so.  SYMPTOMS TO REPORT IMMEDIATELY:   Following lower endoscopy (colonoscopy or flexible sigmoidoscopy):  Excessive amounts of blood in the stool  Significant tenderness or worsening of abdominal pains  Swelling of the abdomen that is new, acute  Fever of 100F or higher   Following upper endoscopy (EGD)  Vomiting of blood or coffee ground material  New chest pain or pain under the shoulder blades  Painful or persistently difficult swallowing  New shortness of breath  Fever of 100F or higher  Black, tarry-looking stools  For urgent or emergent issues, a gastroenterologist can be reached at any hour by calling [phone]  [phone]. Do not use MyChart messaging for urgent concerns.    DIET:  We do recommend a small meal at first, but then you may proceed to your regular diet.  Drink plenty of fluids but you should avoid alcoholic beverages for 24 hours.  ACTIVITY:  You should plan to take it easy for the rest of today and you should NOT DRIVE or use heavy machinery until tomorrow (because of the sedation medicines used during the test).    FOLLOW UP: Our staff will call the number listed on your records 48-72 hours following your procedure to check on you and address any questions or concerns that you may have regarding the information given to you following your procedure. If we do not reach you, we will leave a message.  We will attempt to reach you two times.  During this call, we will ask if you have developed any symptoms of COVID 19. If you develop any symptoms (ie: fever, flu-like symptoms, shortness of breath, cough etc.) before then, please call [phone].  If you test positive for Covid 19 in the 2 weeks post procedure, please call and report this information to [place].    If any biopsies were taken you will be contacted by phone or by letter within the next 1-3 weeks.  Please call us at [phone] if you have not heard about the biopsies in 3 weeks.    SIGNATURES/CONFIDENTIALITY: You and/or your care partner have signed paperwork which will be entered into your electronic  medical record.  These signatures attest to the fact that that the information above on your After Visit Summary has been reviewed and is understood.  Full responsibility of the confidentiality of this discharge information lies with you and/or your care-partner.

## 2020-04-08 NOTE — Op Note (Signed)
[place] Patient Name: Isabella Robertson Procedure Date: 04/08/2020 10:27 AM MRN: [id number] Endoscopist: Thornton Park MD, MD Age: 54 Referring MD:  Date of Birth: [birth year] Gender: Female Account #: [id number] Procedure:                Upper GI endoscopy Indications:              Abdominal pain, gas, nausea, early satiety                           Abnormal CT scan 01/15/20                           - Abnormal loops of jejunum containing frothy                            material and borderline dilated up to 2.8 cm                           - slow transition to normal caliber mid small bowel                            and a normal appearance of the ileum                           - suspected jejunal ileus or enteritis. 2. Normal                            appendix.                           History of H pylori PUD Medicines:                Monitored Anesthesia Care Procedure:                Pre-Anesthesia Assessment:                           - Prior to the procedure, a History and Physical                            was performed, and patient medications and                            allergies were reviewed. The patient's tolerance of                            previous anesthesia was also reviewed. The risks                            and benefits of the procedure and the sedation                            options and risks were discussed with the patient.  All questions were answered, and informed consent                            was obtained. Prior Anticoagulants: The patient has                            taken no previous anticoagulant or antiplatelet                            agents. ASA Grade Assessment: II - A patient with                            mild systemic disease. After reviewing the risks                            and benefits, the patient was deemed in                            satisfactory condition to undergo the  procedure.                           After obtaining informed consent, the endoscope was                            passed under direct vision. Throughout the                            procedure, the patient's blood pressure, pulse, and                            oxygen saturations were monitored continuously. The                            Endoscope was introduced through the mouth, and                            advanced to the third part of duodenum. The upper                            GI endoscopy was accomplished without difficulty.                            The patient tolerated the procedure well. Scope In: Scope Out: Findings:                 The examined esophagus was normal. Biopsies were                            obtained from the proximal and distal esophagus                            with cold forceps for histology.  Diffuse atrophic and erythematous mucosa was found                            in the entire examined stomach. Biopsies were taken                            from the antrum, body, and fundus with a cold                            forceps for histology. Estimated blood loss was                            minimal.                           The examined duodenum was normal except for a large                            diverticulum in the second portion of the duodenum.                            Biopsies were taken with a cold forceps for                            histology.                           The cardia and gastric fundus were normal on                            retroflexion. Complications:            No immediate complications. Estimated blood loss:                            Minimal. Estimated Blood Loss:     Estimated blood loss was minimal. Impression:               - Normal esophagus. Biopsied.                           - Gastric mucosal atrophy. Biopsied.                           - Normal examined duodenum.  Biopsied. Recommendation:           - Patient has a contact number available for                            emergencies. The signs and symptoms of potential                            delayed complications were discussed with the                            patient. Return to normal activities tomorrow.  Written discharge instructions were provided to the                            patient.                           - Resume previous diet.                           - Continue present medications.                           - No aspirin, ibuprofen, naproxen, or other                            non-steroidal anti-inflammatory drugs.                           - Await pathology results.                           - Proceed with colonoscopy as previously planned.                           - Consider a trial of Xifaxan if biopsy results are                            non-diagnostic. Thornton Park MD, MD 04/08/2020 11:11:58 AM This report has been signed electronically.

## 2020-04-10 ENCOUNTER — Telehealth: Payer: Self-pay | Admitting: *Deleted

## 2020-04-10 NOTE — Telephone Encounter (Signed)
°  Follow up Call-  Call back number 04/08/2020  Post procedure Call Back phone  # [phone]  Permission to leave phone message Yes  Some recent data might be hidden     Patient questions:  Do you have a fever, pain , or abdominal swelling? No. Pain Score  0 *  Have you tolerated food without any problems? Yes.    Have you been able to return to your normal activities? Yes.    Do you have any questions about your discharge instructions: Diet   No. Medications  No. Follow up visit  No.  Do you have questions or concerns about your Care? No.  Actions: * If pain score is 4 or above: No action needed, pain <4.  1. Have you developed a fever since your procedure? no  2.   Have you had an respiratory symptoms (SOB or cough) since your procedure? no  3.   Have you tested positive for COVID 19 since your procedure no  4.   Have you had any family members/close contacts diagnosed with the COVID 19 since your procedure?  no   If yes to any of these questions please route to Joylene John, RN and Joella Prince, RN

## 2020-04-14 ENCOUNTER — Telehealth: Payer: Self-pay

## 2020-04-14 ENCOUNTER — Other Ambulatory Visit: Payer: Self-pay

## 2020-04-14 ENCOUNTER — Encounter: Payer: Self-pay | Admitting: Family Medicine

## 2020-04-14 MED ORDER — PANTOPRAZOLE SODIUM 40 MG PO TBEC: 40.0000 mg | DELAYED_RELEASE_TABLET | Freq: Two times a day (BID) | ORAL | 3 refills | Status: DC

## 2020-04-14 MED ORDER — DOXYCYCLINE MONOHYDRATE 100 MG PO TABS: 100.0000 mg | ORAL_TABLET | Freq: Two times a day (BID) | ORAL | 0 refills | Status: DC

## 2020-04-14 NOTE — Telephone Encounter (Signed)
No I will see what Dr. Tarri Glenn wants to send in instead, thanks so much.

## 2020-04-14 NOTE — Telephone Encounter (Signed)
Script sent to pharmacy.

## 2020-04-14 NOTE — Telephone Encounter (Signed)
Received faxed notification that Xifaxan 200 mg tablets are not covered by patient's insurance.  Covered formulary alternatives listed are Cipro HCI and Azithromycin.   CoverMyMeds key [id number].

## 2020-04-14 NOTE — Telephone Encounter (Signed)
Jan are you working on this prior [place] for the [facility]? Just wanted to make sure.

## 2020-04-14 NOTE — Telephone Encounter (Signed)
Will insurance cover doxycycline 100 mg BID x 14 days?

## 2020-04-14 NOTE — Telephone Encounter (Signed)
With pts insurance she cannot afford the xifaxan. Her insurance will cover cipro or azithromycin. Please advise.

## 2020-04-23 ENCOUNTER — Telehealth: Payer: Self-pay | Admitting: Gastroenterology

## 2020-04-23 NOTE — Telephone Encounter (Signed)
Pt called to inform that her insurance only covers Pantoprazole 1 pill/day not 1 pill BID.

## 2020-04-24 ENCOUNTER — Other Ambulatory Visit: Payer: Self-pay

## 2020-04-24 MED ORDER — OMEPRAZOLE 40 MG PO CPDR
40.0000 mg | DELAYED_RELEASE_CAPSULE | Freq: Two times a day (BID) | ORAL | 3 refills | Status: DC
Start: 2020-04-24 — End: 2020-08-04

## 2020-04-24 NOTE — Telephone Encounter (Signed)
Omeprazole 40mg  BID called in to pharmacy, pt aware.

## 2020-04-24 NOTE — Telephone Encounter (Signed)
Pt states her insurance will cover protonix 40mg  daily but not BID. Please advise.

## 2020-04-24 NOTE — Telephone Encounter (Signed)
Please convert to a PPI that her insurance will cover twice daily. Thank you.

## 2020-04-29 ENCOUNTER — Other Ambulatory Visit: Payer: Self-pay

## 2020-04-29 ENCOUNTER — Encounter: Payer: Self-pay | Admitting: Family Medicine

## 2020-04-29 ENCOUNTER — Ambulatory Visit (INDEPENDENT_AMBULATORY_CARE_PROVIDER_SITE_OTHER): Payer: PRIVATE HEALTH INSURANCE | Admitting: Family Medicine

## 2020-04-29 VITALS — BP 118/80 | HR 85 | Temp 97.9°F | Ht 61.25 in | Wt 146.4 lb

## 2020-04-29 DIAGNOSIS — Z1159 Encounter for screening for other viral diseases: Secondary | ICD-10-CM

## 2020-04-29 DIAGNOSIS — R109 Unspecified abdominal pain: Secondary | ICD-10-CM

## 2020-04-29 DIAGNOSIS — E785 Hyperlipidemia, unspecified: Secondary | ICD-10-CM

## 2020-04-29 DIAGNOSIS — Z0000 Encounter for general adult medical examination without abnormal findings: Secondary | ICD-10-CM

## 2020-04-29 DIAGNOSIS — G8929 Other chronic pain: Secondary | ICD-10-CM

## 2020-04-29 DIAGNOSIS — K6389 Other specified diseases of intestine: Secondary | ICD-10-CM

## 2020-04-29 DIAGNOSIS — Z803 Family history of malignant neoplasm of breast: Secondary | ICD-10-CM

## 2020-04-29 DIAGNOSIS — K2100 Gastro-esophageal reflux disease with esophagitis, without bleeding: Secondary | ICD-10-CM

## 2020-04-29 DIAGNOSIS — R1013 Epigastric pain: Secondary | ICD-10-CM

## 2020-04-29 LAB — HM MAMMOGRAPHY

## 2020-04-29 NOTE — Assessment & Plan Note (Signed)
See above

## 2020-04-29 NOTE — Assessment & Plan Note (Signed)
Update FLP off statin.  The ASCVD Risk score Mikey Bussing DC Jr., et al., 2013) failed to calculate for the following reasons:   Cannot find a previous HDL lab   Cannot find a previous total cholesterol lab

## 2020-04-29 NOTE — Assessment & Plan Note (Signed)
Preventative protocols reviewed and updated unless pt declined. Discussed healthy diet and lifestyle.  

## 2020-04-29 NOTE — Assessment & Plan Note (Addendum)
Saw [facility], s/p treatment with doxy 14d course with benefit.

## 2020-04-29 NOTE — Assessment & Plan Note (Signed)
Appreciate GI care - underwent treatment for SIBO with what sounds like doxy 14 day course. She did not finish xifaxamin course. Now planning PPI BID regimen for 12 wks for chronic gastritis and reflux esophagitis.

## 2020-04-29 NOTE — Patient Instructions (Addendum)
Labs today Considere vacuna para tetano/tos ferina.  Considere vacuna contra shingles (culebrilla).  Gusto verla hoy.  Laboratorios hoy.  Regresar en 1 ao para proximo fisico, antes si necesita  Health Maintenance for Postmenopausal Women Menopause is a normal process in which your ability to get pregnant comes to an end. This process happens slowly over many months or years, usually between the ages of 62 and 48. Menopause is complete when you have missed your menstrual periods for 12 months. It is important to talk with your health care provider about some of the most common conditions that affect women after menopause (postmenopausal women). These include heart disease, cancer, and bone loss (osteoporosis). Adopting a healthy lifestyle and getting preventive care can help to promote your health and wellness. The actions you take can also lower your chances of developing some of these common conditions. What should I know about menopause? During menopause, you may get a number of symptoms, such as:  Hot flashes. These can be moderate or severe.  Night sweats.  Decrease in sex drive.  Mood swings.  Headaches.  Tiredness.  Irritability.  Memory problems.  Insomnia. Choosing to treat or not to treat these symptoms is a decision that you make with your health care provider. Do I need hormone replacement therapy?  Hormone replacement therapy is effective in treating symptoms that are caused by menopause, such as hot flashes and night sweats.  Hormone replacement carries certain risks, especially as you become older. If you are thinking about using estrogen or estrogen with progestin, discuss the benefits and risks with your health care provider. What is my risk for heart disease and stroke? The risk of heart disease, heart attack, and stroke increases as you age. One of the causes may be a change in the body's hormones during menopause. This can affect how your body uses dietary  fats, triglycerides, and cholesterol. Heart attack and stroke are medical emergencies. There are many things that you can do to help prevent heart disease and stroke. Watch your blood pressure  High blood pressure causes heart disease and increases the risk of stroke. This is more likely to develop in people who have high blood pressure readings, are of African descent, or are overweight.  Have your blood pressure checked: ? Every 3-5 years if you are 29-60 years of age. ? Every year if you are 95 years old or older. Eat a healthy diet   Eat a diet that includes plenty of vegetables, fruits, low-fat dairy products, and lean protein.  Do not eat a lot of foods that are high in solid fats, added sugars, or sodium. Get regular exercise Get regular exercise. This is one of the most important things you can do for your health. Most adults should:  Try to exercise for at least 150 minutes each week. The exercise should increase your heart rate and make you sweat (moderate-intensity exercise).  Try to do strengthening exercises at least twice each week. Do these in addition to the moderate-intensity exercise.  Spend less time sitting. Even light physical activity can be beneficial. Other tips  Work with your health care provider to achieve or maintain a healthy weight.  Do not use any products that contain nicotine or tobacco, such as cigarettes, e-cigarettes, and chewing tobacco. If you need help quitting, ask your health care provider.  Know your numbers. Ask your health care provider to check your cholesterol and your blood sugar (glucose). Continue to have your blood tested as directed by  your health care provider. Do I need screening for cancer? Depending on your health history and family history, you may need to have cancer screening at different stages of your life. This may include screening for:  Breast cancer.  Cervical cancer.  Lung cancer.  Colorectal cancer. What is my  risk for osteoporosis? After menopause, you may be at increased risk for osteoporosis. Osteoporosis is a condition in which bone destruction happens more quickly than new bone creation. To help prevent osteoporosis or the bone fractures that can happen because of osteoporosis, you may take the following actions:  If you are 49-46 years old, get at least 1,000 mg of calcium and at least 600 mg of vitamin D per day.  If you are older than age 5 but younger than age 57, get at least 1,200 mg of calcium and at least 600 mg of vitamin D per day.  If you are older than age 36, get at least 1,200 mg of calcium and at least 800 mg of vitamin D per day. Smoking and drinking excessive alcohol increase the risk of osteoporosis. Eat foods that are rich in calcium and vitamin D, and do weight-bearing exercises several times each week as directed by your health care provider. How does menopause affect my mental health? Depression may occur at any age, but it is more common as you become older. Common symptoms of depression include:  Low or sad mood.  Changes in sleep patterns.  Changes in appetite or eating patterns.  Feeling an overall lack of motivation or enjoyment of activities that you previously enjoyed.  Frequent crying spells. Talk with your health care provider if you think that you are experiencing depression. General instructions See your health care provider for regular wellness exams and vaccines. This may include:  Scheduling regular health, dental, and eye exams.  Getting and maintaining your vaccines. These include: ? Influenza vaccine. Get this vaccine each year before the flu season begins. ? Pneumonia vaccine. ? Shingles vaccine. ? Tetanus, diphtheria, and pertussis (Tdap) booster vaccine. Your health care provider may also recommend other immunizations. Tell your health care provider if you have ever been abused or do not feel safe at home. Summary  Menopause is a normal  process in which your ability to get pregnant comes to an end.  This condition causes hot flashes, night sweats, decreased interest in sex, mood swings, headaches, or lack of sleep.  Treatment for this condition may include hormone replacement therapy.  Take actions to keep yourself healthy, including exercising regularly, eating a healthy diet, watching your weight, and checking your blood pressure and blood sugar levels.  Get screened for cancer and depression. Make sure that you are up to date with all your vaccines. This information is not intended to replace advice given to you by your health care provider. Make sure you discuss any questions you have with your health care provider. Document Revised: 06/14/2018 Document Reviewed: 06/14/2018 Elsevier Patient Education  2020 [employer].

## 2020-04-29 NOTE — Progress Notes (Signed)
This visit was conducted in person.  BP 118/80 (BP Location: Left Arm, Patient Position: Sitting, Cuff Size: Normal)    Pulse 85    Temp 97.9 F (36.6 C) (Temporal)    Ht 5' 1.25" (1.556 m)    Wt 146 lb 6 oz (66.4 kg)    SpO2 95%    BMI 27.43 kg/m    CC: CPE Subjective:    Patient ID: Isabella Robertson, female    DOB: October 10, 1965, 54 y.o.   MRN: [id number]  HPI: Isabella Robertson is a 54 y.o. female presenting on 04/29/2020 for Annual Exam   Chronic abd pain - saw [facility] Tarri Glenn) s/p EGD, colonoscopy. EGD showed chronic gastritis and reflux esophagitis - omeprazole increased to BID dosing. Also found to have SIBO treated with xifaxan 2 wk course. After colonoscopy she had 2 wk course of doxycycline. Planning to start omeprazole 40mg  bid - at pharmacy.   Preventative: Colonoscopy 04/2020 - hemorrhoids, diverticulosis, repeat 10 yrs ([facility]) EGD - normal esophagus, duodenum, gastric mucosal atrophy s/p biopsies showing chronic gastritis and reflux esophagitis ([facility])  Breast cancer screening - mammogram completed today ([facility] in [place]) - results pending (through work) Well woman exam - s/p hysterectomy for heavy bleeding ?fibroids, ovaries remain. Notes occasional hot flashes intermittently at night time.  Lung cancer screening - not eligible DEXA scan - not due yet Flu shot - declines Tetanus - declines, thinks 2003  Pneumonia shot - not due Shingrix - discussed, declines  COVID vaccine Pfizer 09/2019, 10/2019 Seat belt use discussed  Sunscreen use discussed. No changing moles on skin. Non smoker Alcohol  -  None Dentist - yearly Eye exam - yearly       Relevant past medical, surgical, family and social history reviewed and updated as indicated. Interim medical history since our last visit reviewed. Allergies and medications reviewed and updated. Outpatient Medications Prior to Visit  Medication Sig Dispense Refill   albuterol (VENTOLIN HFA) 108 (90 Base)  MCG/ACT inhaler Inhale 1-2 puffs into the lungs every 4 (four) hours as needed for wheezing or shortness of breath. 6.7 g 0   cetirizine (ZYRTEC) 10 MG tablet Take 10 mg by mouth daily as needed.      montelukast (SINGULAIR) 10 MG tablet Take 1 tablet (10 mg total) by mouth at bedtime. (Patient not taking: Reported on 02/04/2020) 30 tablet 3   omeprazole (PRILOSEC) 40 MG capsule Take 1 capsule (40 mg total) by mouth 2 (two) times daily. (Patient not taking: Reported on 04/29/2020) 60 capsule 3   sucralfate (CARAFATE) 1 g tablet Take 1 tablet (1 g total) by mouth 4 (four) times daily -  with meals and at bedtime. (Patient not taking: Reported on 04/29/2020) 60 tablet 1   doxycycline (ADOXA) 100 MG tablet Take 1 tablet (100 mg total) by mouth 2 (two) times daily. 28 tablet 0   fluticasone (FLONASE) 50 MCG/ACT nasal spray PLACE 1 SPRAY INTO BOTH NOSTRILS 2 (TWO) TIMES DAILY (Patient not taking: Reported on 02/04/2020) 16 g 2   pantoprazole (PROTONIX) 40 MG tablet Take 1 tablet (40 mg total) by mouth 2 (two) times daily. 60 tablet 3   rifaximin (XIFAXAN) 200 MG tablet Take 1 tablet (200 mg total) by mouth 3 (three) times daily. 42 tablet 0   No facility-administered medications prior to visit.     Per HPI unless specifically indicated in ROS section below Review of Systems  Constitutional: Negative for activity change, appetite change, chills,  fatigue, fever and unexpected weight change.  HENT: Negative for hearing loss.   Eyes: Negative for visual disturbance.  Respiratory: Negative for cough, chest tightness, shortness of breath and wheezing.   Cardiovascular: Negative for chest pain, palpitations and leg swelling.  Gastrointestinal: Positive for abdominal pain (chronic). Negative for abdominal distention, blood in stool, constipation, diarrhea, nausea and vomiting.  Genitourinary: Negative for difficulty urinating and hematuria.  Musculoskeletal: Negative for arthralgias, myalgias and neck  pain.  Skin: Negative for rash.  Neurological: Negative for dizziness, seizures, syncope and headaches.  Hematological: Negative for adenopathy. Does not bruise/bleed easily.  Psychiatric/Behavioral: Negative for dysphoric mood. The patient is not nervous/anxious.    Objective:  BP 118/80 (BP Location: Left Arm, Patient Position: Sitting, Cuff Size: Normal)    Pulse 85    Temp 97.9 F (36.6 C) (Temporal)    Ht 5' 1.25" (1.556 m)    Wt 146 lb 6 oz (66.4 kg)    SpO2 95%    BMI 27.43 kg/m   Wt Readings from Last 3 Encounters:  04/29/20 146 lb 6 oz (66.4 kg)  04/08/20 143 lb (64.9 kg)  01/25/20 143 lb 4 oz (65 kg)      Physical Exam Vitals and nursing note reviewed.  Constitutional:      General: She is not in acute distress.    Appearance: Normal appearance. She is well-developed. She is not ill-appearing.  HENT:     Head: Normocephalic and atraumatic.     Right Ear: Hearing, tympanic membrane, ear canal and external ear normal.     Left Ear: Hearing, tympanic membrane, ear canal and external ear normal.  Eyes:     General: No scleral icterus.    Extraocular Movements: Extraocular movements intact.     Conjunctiva/sclera: Conjunctivae normal.     Pupils: Pupils are equal, round, and reactive to light.  Neck:     Thyroid: No thyroid mass or thyromegaly.  Cardiovascular:     Rate and Rhythm: Normal rate and regular rhythm.     Pulses: Normal pulses.          Radial pulses are 2+ on the right side and 2+ on the left side.     Heart sounds: Normal heart sounds. No murmur heard.   Pulmonary:     Effort: Pulmonary effort is normal. No respiratory distress.     Breath sounds: Normal breath sounds. No wheezing, rhonchi or rales.  Abdominal:     General: Abdomen is flat. Bowel sounds are normal. There is no distension.     Palpations: Abdomen is soft. There is no mass.     Tenderness: There is no abdominal tenderness. There is no guarding or rebound.     Hernia: No hernia is present.   Musculoskeletal:        General: Normal range of motion.     Cervical back: Normal range of motion and neck supple.     Right lower leg: No edema.     Left lower leg: No edema.  Lymphadenopathy:     Cervical: No cervical adenopathy.  Skin:    General: Skin is warm and dry.     Findings: No rash.  Neurological:     General: No focal deficit present.     Mental Status: She is alert and oriented to person, place, and time.     Comments: CN grossly intact, station and gait intact  Psychiatric:        Mood and Affect: Mood normal.  Behavior: Behavior normal.        Thought Content: Thought content normal.        Judgment: Judgment normal.       Assessment & Plan:  This visit occurred during the SARS-CoV-2 public health emergency.  Safety protocols were in place, including screening questions prior to the visit, additional usage of staff PPE, and extensive cleaning of exam room while observing appropriate contact time as indicated for disinfecting solutions.   Problem List Items Addressed This Visit    Small intestinal bacterial overgrowth (SIBO)    Saw [facility], s/p treatment with doxy 14d course with benefit.       HLD (hyperlipidemia)    Update FLP off statin.  The ASCVD Risk score Mikey Bussing DC Jr., et al., 2013) failed to calculate for the following reasons:   Cannot find a previous HDL lab   Cannot find a previous total cholesterol lab       Relevant Orders   Lipid panel   Health maintenance examination - Primary    Preventative protocols reviewed and updated unless pt declined. Discussed healthy diet and lifestyle.       GERD (gastroesophageal reflux disease)    Saw GI, planning PPI BID x 12 wks given EGD findings.       Family history of malignant neoplasm of breast    Had mammo through work today.       Epigastric abdominal pain    See above.       Chronic abdominal pain    Appreciate GI care - underwent treatment for SIBO with what sounds like doxy 14 day  course. She did not finish xifaxamin course. Now planning PPI BID regimen for 12 wks for chronic gastritis and reflux esophagitis.        Other Visit Diagnoses    Need for hepatitis C screening test       Relevant Orders   Hepatitis C antibody       No orders of the defined types were placed in this encounter.  Orders Placed This Encounter  Procedures   Lipid panel   Hepatitis C antibody    Patient instructions: Labs today Considere vacuna para tetano/tos ferina.  Considere vacuna contra shingles (culebrilla).  Gusto verla hoy.  Laboratorios hoy.  Regresar en 1 ao para proximo fisico, antes si necesita  Follow up plan: Return in about 1 year (around 04/29/2021) for annual exam, prior fasting for blood work.  Ria Bush, MD

## 2020-04-29 NOTE — Assessment & Plan Note (Signed)
Had mammo through work today.

## 2020-04-29 NOTE — Assessment & Plan Note (Signed)
Saw GI, planning PPI BID x 12 wks given EGD findings.

## 2020-04-30 LAB — LIPID PANEL
Cholesterol: 251 mg/dL — ABNORMAL HIGH (ref 0–200)
HDL: 58.6 mg/dL (ref >39.00)
NonHDL: 191.99
Total CHOL/HDL Ratio: 4
Triglycerides: 232 mg/dL — ABNORMAL HIGH (ref 0.0–149.0)
VLDL: 46.4 mg/dL — ABNORMAL HIGH (ref 0.0–40.0)

## 2020-04-30 LAB — HEPATITIS C ANTIBODY
Hepatitis C Ab: NONREACTIVE
SIGNAL TO CUT-OFF: 0.02 (ref <1.00)

## 2020-04-30 LAB — LDL CHOLESTEROL, DIRECT: Direct LDL: 167 mg/dL

## 2020-05-05 ENCOUNTER — Telehealth: Payer: Self-pay | Admitting: Family Medicine

## 2020-05-05 NOTE — Telephone Encounter (Signed)
error 

## 2020-06-17 ENCOUNTER — Ambulatory Visit: Payer: PRIVATE HEALTH INSURANCE | Admitting: Gastroenterology

## 2020-06-25 ENCOUNTER — Encounter: Payer: Self-pay | Admitting: Gastroenterology

## 2020-06-25 ENCOUNTER — Other Ambulatory Visit (INDEPENDENT_AMBULATORY_CARE_PROVIDER_SITE_OTHER): Payer: PRIVATE HEALTH INSURANCE

## 2020-06-25 ENCOUNTER — Ambulatory Visit: Payer: PRIVATE HEALTH INSURANCE | Admitting: Gastroenterology

## 2020-06-25 DIAGNOSIS — R933 Abnormal findings on diagnostic imaging of other parts of digestive tract: Secondary | ICD-10-CM

## 2020-06-25 DIAGNOSIS — G8929 Other chronic pain: Secondary | ICD-10-CM

## 2020-06-25 DIAGNOSIS — R1031 Right lower quadrant pain: Secondary | ICD-10-CM | POA: Diagnosis not present

## 2020-06-25 DIAGNOSIS — R109 Unspecified abdominal pain: Secondary | ICD-10-CM

## 2020-06-25 LAB — SEDIMENTATION RATE: Sed Rate: 6 mm/hr (ref 0–30)

## 2020-06-25 LAB — C-REACTIVE PROTEIN: CRP: <1 mg/dL (ref 0.5–20.0)

## 2020-06-25 MED ORDER — DICYCLOMINE HCL 10 MG PO CAPS: 10.0000 mg | ORAL_CAPSULE | Freq: Three times a day (TID) | ORAL | 1 refills | Status: DC

## 2020-06-25 NOTE — Patient Instructions (Addendum)
Continue to take omeprazole 40 mg twice daily.  I have recommended a trial of dicyclomine taken up to 4 times daily. Ideally, you could take this prior to meals to try to avoid your abdominal pain.  We have sent the following medications to your pharmacy for you to pick up at your convenience: START: dicyclomine 10mg  take one tablet 4 times daily.  I have recommended labs and a follow-up CT to evaluate your ongoing abdominal pain. This is a CT specific for the small bowel, as that was the area of changes seen on your CT scan over the summer.  I would like to see you after the CT scan. Please call with any questions or concerns prior to that time.   If you are age 54 or younger, your body mass index should be between 19-25. Your Body mass index is 27.78 kg/m. If this is out of the aformentioned range listed, please consider follow up with your Primary Care Provider.   Your provider has requested that you go to the basement level for lab work before leaving today. Press "B" on the elevator. The lab is located at the first door on the left as you exit the elevator.  Due to recent changes in healthcare laws, you may see the results of your imaging and laboratory studies on MyChart before your provider has had a chance to review them.  We understand that in some cases there may be results that are confusing or concerning to you. Not all laboratory results come back in the same time frame and the provider may be waiting for multiple results in order to interpret others.  Please give [place] 48 hours in order for your provider to thoroughly review all the results before contacting the office for clarification of your results.   You have been scheduled for a CT enterography of the abdomen and pelvis with contrast at [facility] ([address]---this is in the same building as [employer]).   You are scheduled on 07-01-2020 at 11:30am. You should arrive 15 minutes prior to your  appointment time for registration. Please follow the written instructions below on the day of your exam:  Nothing to eat or drink at least 4 hours prior to the test.  Please make sure to drink plenty of fluids the day prior to the test.  If you have any questions regarding your exam or if you need to reschedule, you may call the CT department at [phone] between the hours of 8:00 am and 5:00 pm, Monday-Friday.  ____________________________________________________________  Thank you for entrusting me with your care and choosing [facility].  Dr Tarri Glenn

## 2020-06-25 NOTE — Progress Notes (Signed)
Referring Provider: Ria Bush, MD Primary Care Physician:  Ria Bush, MD  Reason for Consultation:  Abdominal pain  IMPRESSION:  Ongoing abdominal pain despite improvement in other symptoms Abnormal CT scan 01/15/20    - Abnormal loops of jejunum containing frothy material and borderline dilated up to 2.8 cm    - slow transition to normal caliber mid small bowel and a normal appearance of the ileum    - suspected jejunal ileus or enteritis. 2. Normal appendix.  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    - gas, nausea, regurgitation, early satiety improved with trial of dicyclomine x 14 days Reflux esophagitis and H pylori negative gastritis on EGD 04/08/20 Focus of intestinal metaplasia in the gastric body on EGD 04/08/20    - no additional risk factors gastric cancer    - no data to support surveillance endoscopy at this time, will revisit annually History of H pylori PUD History of incomplete colonoscopy 2010 and 2018, high quality colonoscopy 2021 CT colonography 2018 showed no polyps Diverticulosis No known family history of colon cancer or polyps   PLAN: - Continue omeprazole 40 mg BID - Trial of dicyclomine 20 mg QID PRN (Discussed taking it before meals) - ESR, CRP, fecal calprotectin to evaluate for IBD - CTE to further evaluate the abnormal CT scan in July - Lactulose breath test for bacterial overgrowth if symptoms recur, as she is high risk given the duodenal diverticulum - Follow-up office visit after the CTE  Please see the "Patient Instructions" section for addition details about the plan.  HPI: Isabella Robertson is a 54 y.o. female initially seen in consultation 02/04/2020 for abdominal pain. She has allergic rhinitis, asthma, GERD, a history of H pylori PUD, hyperlipidemia, and diffuse cystic mastopathy. History of c section, laparoscopic evaluation for endometriosis, vaginal hysterectomy, and LOA.   She is a silver Production assistant, radio  at [employer].   Previously followed at the [facility]. Last seen 08/18/18.   Years of chronic lower abdominal pain since her 54s that improved with dicyclomine.  Diagnosed with IBS and lactose intolerance. Diarrhea that is slowed with Carafate.  History of H pylori by stool antigen 2015. Treated with Pylera.  Reference in referral records to treatment two times prior to Pylera.  Known diverticulosis without diverticulitis.  Incomplete colonoscopy 2018.    At the time of consultation in August she reported sharp lower chest and upper abdominal pain that would wake her from sleep.  Today she reports lower chest and upper abdominal pain. Sharp, awakes her from sleep.  Feels like food sits in her stomach and is relieved by vomiting and eructation. Occasional nausea, early satiety, gas.  Feels like intestines were sticky after hysterectomy. Symptoms start as soon as she starts eating.  Not improved with omeprazole 20 mg daily. Dicyclomine helped with the gas in the past, although now it gives her nausea and she doesn't like it any more  Prior work-up included - CT abd/pelvis 2015 for abdominal pain: tiny left inguinal hernia, no reason for pain identified - EGD 2007 with H pylori gastritis - Colonoscopy 2010 incomplete due to tortuous colon - Colonoscopy 2018 with Dr. Gustavo Lah: incomplete due to sharp angulation at 42-44 cm from the anal verge, diverticulosis in the sigmoid and descending colon. Biopsies taken from the left colon to exclude microscopic colitis. Biopsies were normal.  She was under the impression that she needed a surgeon to be present at the time of - CT colonoscopy  10/18: no polyps. No acute abnormality.  - CT abd/pelvis 01/15/20: 1. Abnormal loops of jejunum containing frothy material and borderline dilated up to 2.8 cm, with a slow transition to normal caliber mid small bowel and a normal appearance of the ileum. Given the lack of an obvious point of  obstruction, this may reflect jejunal ileus or enteritis. 2. Normal appendix. 3. Transverse duodenal diverticulum without findings of Inflammation. 4. Linear subsegmental atelectasis or scarring in the posterior basal segment right lower lobe. - Labs 01/15/20: normal CMP, normal lipase, normal CBC  Endoscopic evaluation 04/08/2020. EGD revealed reflux esophagitis and mild, chronic gastritis.  There was a focus of intestinal metaplasia in the gastric body.  Biopsies were negative for H. pylori and eosinophilic esophagitis.  Colonoscopy performed to the terminal ileum revealed left-sided diverticulosis and hemorrhoids.  I recommended pantoprazole 40 mg twice daily. Insurance approved omeprazole 40 mg twice daily. Despite 6 months of treatment her symptoms persist. She stopped taking the Carafate.   She also completed 14 days of doxycline for possible SIBO with resolution of her regurgitation, nausea, and early satiety.  Continues to have epigastric pain, right lower quadrant pain, and left flank pain. But, it only occurs when she is hungry.  She really wants to know why she has continued right lower quadrant pain. She is concerned the symptoms are a blockage.      Past Medical History:  Diagnosis Date  . Asthma    triggers are dust, smoke, weather changes  . Chronic abdominal pain   . Diffuse cystic mastopathy   . Diverticulitis   . E coli infection    abdomen  . Endometriosis    History of; Now s/p hysterectomy.  . Genetic screening 11/28/2012   negative /[facility]  . Helicobacter pylori (H. pylori) infection    x 3 per as of 06/2017   . IBS (irritable bowel syndrome)    diarrhea type per pt as of 06/2017     Past Surgical History:  Procedure Laterality Date  . ABDOMINAL HYSTERECTOMY  2007   ovaries remain  . CESAREAN SECTION  2003  . COLONOSCOPY  2010  . COLONOSCOPY  04/2020   hem, diverticulosis rpt 10 yrs [facility])  . COLONOSCOPY WITH PROPOFOL N/A 04/08/2017   Procedure:  COLONOSCOPY WITH PROPOFOL;  Surgeon: Lollie Sails, MD;  Location: [facility] ENDOSCOPY;  Service: Endoscopy;  Laterality: N/A;  . ECTOPIC PREGNANCY SURGERY  4827,0786  . ESOPHAGOGASTRODUODENOSCOPY  04/2020   chronic gastritis, reflux esophagitis, neg H pylori - rec PPI BID x 12 wks ([facility])  . EYE SURGERY  2003  . HERNIA REPAIR  7544   umbilical  . UPPER GASTROINTESTINAL ENDOSCOPY      Current Outpatient Medications  Medication Sig Dispense Refill  . cetirizine (ZYRTEC) 10 MG tablet Take 10 mg by mouth daily as needed.     Marland Kitchen omeprazole (PRILOSEC) 40 MG capsule Take 1 capsule (40 mg total) by mouth 2 (two) times daily. 60 capsule 3  . albuterol (VENTOLIN HFA) 108 (90 Base) MCG/ACT inhaler Inhale 1-2 puffs into the lungs every 4 (four) hours as needed for wheezing or shortness of breath. (Patient not taking: Reported on 06/25/2020) 6.7 g 0  . sucralfate (CARAFATE) 1 g tablet Take 1 tablet (1 g total) by mouth 4 (four) times daily -  with meals and at bedtime. (Patient not taking: No sig reported) 60 tablet 1   No current facility-administered medications for this visit.    Allergies as of 06/25/2020 -  Review Complete 06/25/2020  Allergen Reaction Noted  . Shrimp [shellfish allergy]  04/07/2017    Family History  Problem Relation Age of Onset  . Breast cancer Paternal Aunt   . Breast cancer Mother 51  . High blood pressure Mother   . Breast cancer Cousin        breast - paternal side  . Heart attack Father 53  . High blood pressure Father   . Colon cancer Neg Hx   . Stomach cancer Neg Hx   . Esophageal cancer Neg Hx   . Pancreatic cancer Neg Hx   . Rectal cancer Neg Hx     Social History   Socioeconomic History  . Marital status: Married    Spouse name: Not on file  . Number of children: Not on file  . Years of education: Not on file  . Highest education level: Not on file  Occupational History  . Not on file  Tobacco Use  . Smoking status: Never Smoker  . Smokeless  tobacco: Never Used  Vaping Use  . Vaping Use: Never used  Substance and Sexual Activity  . Alcohol use: No    Alcohol/week: 0.0 standard drinks  . Drug use: No  . Sexual activity: Yes  Other Topics Concern  . Not on file  Social History Narrative  . Not on file   Social Determinants of Health   Financial Resource Strain: Not on file  Food Insecurity: Not on file  Transportation Needs: Not on file  Physical Activity: Not on file  Stress: Not on file  Social Connections: Not on file  Intimate Partner Violence: Not on file     Physical Exam: General:   Alert,  well-nourished, pleasant and cooperative in NAD Head:  Normocephalic and atraumatic. Eyes:  Sclera clear, no icterus.   Conjunctiva pink. Abdomen:  Soft,nontender, nondistended, normal bowel sounds, no rebound or guarding. No hepatosplenomegaly.   Neurologic:  Alert and  oriented x4;  grossly nonfocal Skin:  Intact without significant lesions or rashes. Psych:  Alert and cooperative. Normal mood and affect.    Johnjoseph Rolfe L. Tarri Glenn, MD, MPH 06/25/2020, 3:20 PM

## 2020-06-26 ENCOUNTER — Other Ambulatory Visit: Payer: PRIVATE HEALTH INSURANCE

## 2020-06-26 DIAGNOSIS — R109 Unspecified abdominal pain: Secondary | ICD-10-CM

## 2020-06-26 DIAGNOSIS — R1031 Right lower quadrant pain: Secondary | ICD-10-CM

## 2020-06-26 DIAGNOSIS — R933 Abnormal findings on diagnostic imaging of other parts of digestive tract: Secondary | ICD-10-CM

## 2020-07-01 ENCOUNTER — Other Ambulatory Visit: Payer: Self-pay

## 2020-07-01 ENCOUNTER — Ambulatory Visit (INDEPENDENT_AMBULATORY_CARE_PROVIDER_SITE_OTHER)
Admission: RE | Admit: 2020-07-01 | Discharge: 2020-07-01 | Disposition: A | Discharge status: Home / Self Care | Payer: PRIVATE HEALTH INSURANCE | Source: Ambulatory Visit | Attending: Gastroenterology | Admitting: Gastroenterology

## 2020-07-01 DIAGNOSIS — R109 Unspecified abdominal pain: Secondary | ICD-10-CM | POA: Diagnosis not present

## 2020-07-01 DIAGNOSIS — R1031 Right lower quadrant pain: Secondary | ICD-10-CM | POA: Diagnosis not present

## 2020-07-01 DIAGNOSIS — G8929 Other chronic pain: Secondary | ICD-10-CM | POA: Diagnosis not present

## 2020-07-01 DIAGNOSIS — R933 Abnormal findings on diagnostic imaging of other parts of digestive tract: Secondary | ICD-10-CM

## 2020-07-01 MED ORDER — IOHEXOL 300 MG/ML  SOLN
100.0000 mL | Freq: Once | INTRAMUSCULAR | Status: AC | PRN
  Administered 2020-07-01: 12:00:00 100 mL via INTRAVENOUS

## 2020-07-02 LAB — CALPROTECTIN, FECAL: Calprotectin, Fecal: 46 ug/g (ref 0–120)

## 2020-07-31 ENCOUNTER — Telehealth: Payer: Self-pay | Admitting: *Deleted

## 2020-07-31 NOTE — Telephone Encounter (Signed)
Pt gets her Omeprazole filled through workplace dispensary at no cost. We do have to have an updated Rx on file. If appropriate, could you please enter a new Rx for her Omeprazole 40mg  po BID #180 RF3?  Last OV 06/25/20 with notes to continue Omeprazole.   Pharmacy may be entered as [employer] and "No Print." We will pull Rx from [facility].  Please advise if any questions or concerns.   Thank you.

## 2020-07-31 NOTE — Telephone Encounter (Signed)
Please call my office. Ammie Eversole, LPN, should be able to help clarify. [phone]  Thank you.

## 2020-08-04 ENCOUNTER — Ambulatory Visit: Payer: PRIVATE HEALTH INSURANCE | Admitting: Gastroenterology

## 2020-08-04 ENCOUNTER — Encounter: Payer: Self-pay | Admitting: Gastroenterology

## 2020-08-04 VITALS — BP 108/80 | HR 97 | Ht 61.0 in | Wt 149.2 lb

## 2020-08-04 DIAGNOSIS — R935 Abnormal findings on diagnostic imaging of other abdominal regions, including retroperitoneum: Secondary | ICD-10-CM | POA: Diagnosis not present

## 2020-08-04 DIAGNOSIS — K31A0 Gastric intestinal metaplasia, unspecified: Secondary | ICD-10-CM | POA: Diagnosis not present

## 2020-08-04 DIAGNOSIS — R109 Unspecified abdominal pain: Secondary | ICD-10-CM | POA: Diagnosis not present

## 2020-08-04 MED ORDER — OMEPRAZOLE 40 MG PO CPDR: 40.0000 mg | DELAYED_RELEASE_CAPSULE | Freq: Two times a day (BID) | ORAL | 3 refills | Status: AC

## 2020-08-04 NOTE — Addendum Note (Signed)
Addended by: Hardie Pulley, Aijah Lattner J on: 08/04/2020 04:30 PM   Modules accepted: Orders

## 2020-08-04 NOTE — Telephone Encounter (Signed)
Sutton confirmed she is attending appt this afternoon. Thanks!

## 2020-08-04 NOTE — Telephone Encounter (Signed)
Pt attended appt with Dr. Tarri Glenn today. Per Dr. Tarri Glenn, no change will be required to pt current dosage of Omeprazole. Rx has been written at [facility] request for Omeprazole 40mg  po BID, qty #180, refills x3, [facility] as pharmacy of choice and NO PRINT. Routing this message to Cheri Guppy, RN to make her aware.

## 2020-08-04 NOTE — Telephone Encounter (Signed)
Received incoming call from Cheri Guppy, RN with [facility]. Asked if we could write a NO PRINT Rx for Pantoprazole for them to utilize for their dispensary pharmacy that would benefit pt d/t cost. Advised pt does have an appt scheduled with Dr. Tarri Glenn today. Would like to know if pt does intend to attend this appt. If so, we will address request during appt to ensure proper dosage, etc. Otherwise, if pt chooses not to attend appt, will discuss with Dr. Tarri Glenn re: refill request. Cheri Guppy, RN to call pt and will send me a message providing me with an update. Will await her response.

## 2020-08-04 NOTE — Patient Instructions (Addendum)
RECOMMENDATION(S):   ENDOSCOPY:   You have been scheduled for an endoscopy. Please follow written instructions given to you at your visit today.  INHALERS:   If you use inhalers (even only as needed), please bring them with you on the day of your procedure.  PRESCRIPTION MEDICATION(S):   We sent the following prescription to Cheri Guppy, RN for her to provide to your dispensary pharmacy:  . Omeprazole - please take 40mg  by mouth twice daily; 1 year supply  . NOTE: If your medication(s) requires a PRIOR AUTHORIZATION, we will receive notification from your pharmacy. Once received, the process to submit for approval may take up to 7-10 business days. You will be contacted about any denials we have received from your insurance company as well as alternatives recommended by your provider.  BMI:  If you are age 70 or younger, your body mass index should be between 19-25. Your Body mass index is 28.19 kg/m. If this is out of the aformentioned range listed, please consider follow up with your Primary Care Provider.   Thank you for trusting me with your gastrointestinal care!    Thornton Park, MD, MPH

## 2020-08-04 NOTE — Progress Notes (Signed)
Referring Provider: Ria Bush, MD Primary Care Physician:  Ria Bush, MD  Chief complaint:  Abdominal pain  IMPRESSION:  Abdominal pain - ? 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    - gas, nausea, regurgitation, early satiety improved with trial of dicyclomine x 14 days Focus of intestinal metaplasia in the gastric body on EGD 04/08/20    - no additional risk factors gastric cancer History of H pylori PUD History of incomplete colonoscopy 2010 and 2018, high quality colonoscopy 2021 CT colonography 2018 showed no polyps Diverticulosis No known family history of colon cancer or polyps   PLAN: - Continue omeprazole 40 mg BID - Continue dicyclomine 20 mg QID PRN (Discussed taking it before meals and only PRN) - EGD with additional gastric biopsies and to document resolution of gastritis given recent CT findings and intestinal metaplasia for mapping - Lactulose breath test for bacterial overgrowth if symptoms recur, as she is high risk given the duodenal diverticulum  Please see the "Patient Instructions" section for addition details about the plan.  HPI: Isabella Robertson is a 55 y.o. female who is under evaluation for abdominal pain. She returned in scheduled follow-up. She has allergic rhinitis, asthma, GERD, a history of H pylori PUD, hyperlipidemia, and diffuse cystic mastopathy. History of c section, laparoscopic evaluation for endometriosis, vaginal hysterectomy, and LOA.   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   Previously followed at the [facility]. Last seen 08/18/18.    Years of chronic lower abdominal pain since her 58s that improved with dicyclomine.  Diagnosed with IBS and lactose intolerance. Diarrhea that is slowed with Carafate.  History of H pylori by stool antigen 2015. Treated with Pylera.  Reference in referral records to treatment two times prior to Pylera.  Known diverticulosis without diverticulitis.  Incomplete colonoscopy 2018.   She has been under evaluation for lower chest and upper abdominal pain. Sharp, awakes her from sleep. Largely post-prandial.  Feels like food sits in her stomach and is relieved by vomiting and eructation. Occasional nausea, early satiety, gas.  Feels like intestines were sticky after hysterectomy.  Prior work-up included - CT abd/pelvis 2015 for abdominal pain: tiny left inguinal hernia, no reason for pain identified - EGD 2007 with H pylori gastritis - Colonoscopy 2010 incomplete due to tortuous colon - Colonoscopy 2018 with Dr. Gustavo Lah: incomplete due to sharp angulation at 42-44 cm from the anal verge, diverticulosis in the sigmoid and descending colon. Biopsies taken from the left colon to exclude microscopic colitis. Biopsies were normal.  She was under the impression that she needed a surgeon to be present at the time of - CT colonoscopy 10/18: no polyps. No acute abnormality.  - CT abd/pelvis 01/15/20: 1. Abnormal loops of jejunum containing frothy material and borderline dilated up to 2.8 cm, with a slow transition to normal caliber mid small bowel and a normal appearance of the ileum. Given the lack of Robertson obvious point of obstruction, this may reflect jejunal ileus or enteritis. 2. Normal appendix. 3. Transverse duodenal diverticulum without findings of Inflammation. 4. Linear subsegmental atelectasis or scarring  in the posterior basal segment right lower lobe. - Labs 01/15/20: normal CMP, normal lipase, normal CBC  Endoscopic evaluation 04/08/2020. EGD revealed reflux esophagitis and mild, chronic gastritis.   There was a focus of intestinal metaplasia in the gastric body.  Biopsies were negative for H. pylori and eosinophilic esophagitis.  Colonoscopy performed to the terminal ileum revealed left-sided diverticulosis and hemorrhoids.  Labs 06/25/20: normal CRP, ESR, fecal calprotectin  CT Enteroscopy 07/01/20 showed interval resolution in the small bowel thickening. With some changes in the stomach suspicious for gastritis. No acute findings.   After 6 months of pantoprazole 40 mg BID her symptoms are starting to improve.   She also completed 14 days of doxycline for possible SIBO with resolution of her regurgitation, nausea, and early satiety.  Symptoms have improved enough that she stopped taking the Carafate and reduced Bentyl to BID.  She has questions about her most recent CT.      Past Medical History:  Diagnosis Date  . Asthma    triggers are dust, smoke, weather changes  . Chronic abdominal pain   . Diffuse cystic mastopathy   . Diverticulitis   . E coli infection    abdomen  . Endometriosis    History of; Now s/p hysterectomy.  . Genetic screening 11/28/2012   negative /[facility]  . Helicobacter pylori (H. pylori) infection    x 3 per as of 06/2017   . IBS (irritable bowel syndrome)    diarrhea type per pt as of 06/2017     Past Surgical History:  Procedure Laterality Date  . ABDOMINAL HYSTERECTOMY  2007   ovaries remain  . CESAREAN SECTION  2003  . COLONOSCOPY  2010  . COLONOSCOPY  04/2020   hem, diverticulosis rpt 10 yrs [facility])  . COLONOSCOPY WITH PROPOFOL N/A 04/08/2017   Procedure: COLONOSCOPY WITH PROPOFOL;  Surgeon: Lollie Sails, MD;  Location: [facility] ENDOSCOPY;  Service: Endoscopy;  Laterality: N/A;  . ECTOPIC PREGNANCY SURGERY  [id number]  . ESOPHAGOGASTRODUODENOSCOPY  04/2020   chronic gastritis, reflux esophagitis, neg H pylori - rec PPI BID x 12 wks ([facility])  . EYE SURGERY  2003  . HERNIA REPAIR  4888   umbilical  . UPPER GASTROINTESTINAL ENDOSCOPY       Current Outpatient Medications  Medication Sig Dispense Refill  . albuterol (VENTOLIN HFA) 108 (90 Base) MCG/ACT inhaler Inhale 1-2 puffs into the lungs every 4 (four) hours as needed for wheezing or shortness of breath. 6.7 g 0  . Cetirizine HCl (ZYRTEC ALLERGY) 10 MG CAPS Take 10 mg by mouth daily.    Marland Kitchen dicyclomine (BENTYL) 10 MG capsule Take 10 mg by mouth 2 (two) times daily.    Marland Kitchen omeprazole (PRILOSEC) 40 MG capsule Take 1 capsule (40 mg total) by mouth 2 (two) times daily. 60 capsule 3   No current facility-administered medications for this visit.    Allergies as of 08/04/2020 - Review Complete 08/04/2020  Allergen Reaction Noted  . Shrimp [shellfish allergy]  04/07/2017    Family History  Problem Relation Age of Onset  . Breast cancer Paternal Aunt   . Breast cancer Mother 19  . High blood pressure Mother   . Breast cancer Cousin        breast - paternal side  . Heart attack Father 26  . High blood pressure Father   . Colon cancer Neg Hx   . Stomach cancer Neg Hx   . Esophageal cancer Neg Hx   . Pancreatic cancer  Neg Hx   . Rectal cancer Neg Hx     Social History   Socioeconomic History  . Marital status: Married    Spouse name: Not on file  . Number of children: Not on file  . Years of education: Not on file  . Highest education level: Not on file  Occupational History  . Not on file  Tobacco Use  . Smoking status: Never Smoker  . Smokeless tobacco: Never Used  Vaping Use  . Vaping Use: Never used  Substance and Sexual Activity  . Alcohol use: No  . Drug use: No  . Sexual activity: Not Currently  Other Topics Concern  . Not on file  Social History Narrative  . Not on file   Social Determinants of Health   Financial Resource Strain: Not on file  Food Insecurity: Not on file  Transportation Needs: Not on file  Physical Activity: Not on file  Stress: Not on file  Social Connections: Not on file  Intimate Partner Violence: Not on file      Physical Exam: General:   Alert,  well-nourished, pleasant and cooperative in NAD Head:  Normocephalic and atraumatic. Eyes:  Sclera clear, no icterus.   Conjunctiva pink. Abdomen:  Soft, nontender, nondistended, normal bowel sounds, no rebound or guarding. No hepatosplenomegaly.   Neurologic:  Alert and  oriented x4;  grossly nonfocal Skin:  Intact without significant lesions or rashes. Psych:  Alert and cooperative. Normal mood and affect.    Larin Weissberg L. Tarri Glenn, MD, MPH 08/04/2020, 3:28 PM

## 2020-08-05 ENCOUNTER — Encounter: Payer: Self-pay | Admitting: Registered Nurse

## 2020-08-05 ENCOUNTER — Other Ambulatory Visit: Payer: Self-pay

## 2020-08-05 ENCOUNTER — Ambulatory Visit: Payer: Self-pay | Admitting: Registered Nurse

## 2020-08-05 VITALS — BP 110/80 | HR 92 | Temp 98.3°F

## 2020-08-05 DIAGNOSIS — M79672 Pain in left foot: Secondary | ICD-10-CM

## 2020-08-05 MED ORDER — BIOFREEZE 4 % EX GEL: 1.0000 "application " | Freq: Four times a day (QID) | CUTANEOUS | 0 refills | Status: AC | PRN

## 2020-08-05 NOTE — Telephone Encounter (Signed)
Got it! Refilling today. Thanks for your help!

## 2020-08-05 NOTE — Progress Notes (Signed)
Subjective:    Patient ID: Isabella Robertson, female    DOB: [birth year], 55 y.o.   MRN: [id number]  54y/o Hispanic established female pt c/o L foot pain x1 week. Worst over arch. Gets worse throughout day while ambulating. Denied injury/fall/new shoes/swelling/feeling like pebble in shoe.  Has been altering gait due to pain and other foot starting to ache now too.  RN Rolly Salter showed her stretches yesterday but didn't try them after work last night yet.  Doesn't alternate shoes at work wears same pair every day and has been wearing this brand.  Current pair fairly new but not new new.  History of broken toes on both feet.  Pain not near previously broken toes.  Denied new exercise routine or hobbies.  Typically standing at work in [employer].  Has been using biofreeze topical roller as tylenol/nsaids irritate her stomach.     Review of Systems  Constitutional: Negative for activity change, appetite change, chills, diaphoresis, fatigue and fever.  HENT: Negative for trouble swallowing and voice change.   Eyes: Negative for photophobia and visual disturbance.  Respiratory: Negative for cough, shortness of breath and stridor.   Cardiovascular: Negative for leg swelling.  Gastrointestinal: Negative for diarrhea, nausea and vomiting.  Endocrine: Negative for cold intolerance and heat intolerance.  Genitourinary: Negative for difficulty urinating.  Musculoskeletal: Positive for gait problem and myalgias. Negative for joint swelling, neck pain and neck stiffness.  Skin: Negative for color change, pallor, rash and wound.  Allergic/Immunologic: Positive for environmental allergies and food allergies.  Neurological: Negative for dizziness, tremors, seizures, syncope, facial asymmetry, speech difficulty, weakness, light-headedness, numbness and headaches.  Hematological: Negative for adenopathy. Does not bruise/bleed easily.  Psychiatric/Behavioral: Negative for agitation, behavioral  problems, confusion and sleep disturbance.       Objective:   Physical Exam Vitals and nursing note reviewed.  Constitutional:      General: She is awake. She is not in acute distress.Vital signs are normal.     Appearance: Normal appearance. She is well-developed, well-groomed, normal weight and well-nourished. She is not ill-appearing, toxic-appearing or diaphoretic.  HENT:     Head: Normocephalic and atraumatic.     Jaw: There is normal jaw occlusion.     Right Ear: Hearing and external ear normal.     Left Ear: Hearing and external ear normal.     Nose: Nose normal.     Mouth/Throat:     Pharynx: Oropharynx is clear.  Eyes:     General: Lids are normal. Vision grossly intact. Gaze aligned appropriately. No visual field deficit or scleral icterus.       Right eye: No discharge.        Left eye: No discharge.     Extraocular Movements: Extraocular movements intact and EOM normal.     Conjunctiva/sclera: Conjunctivae normal.     Pupils: Pupils are equal, round, and reactive to light.  Neck:     Trachea: Trachea normal.  Cardiovascular:     Rate and Rhythm: Normal rate and regular rhythm.     Pulses: Normal pulses and intact distal pulses.          Popliteal pulses are 2+ on the right side and 2+ on the left side.       Dorsalis pedis pulses are 2+ on the right side and 2+ on the left side.       Posterior tibial pulses are 2+ on the right side and 2+ on the left side.  Pulmonary:     Effort: Pulmonary effort is normal.     Breath sounds: Normal breath sounds and air entry. No stridor or transmitted upper airway sounds. No wheezing.     Comments: Spoke full sentences without difficulty; wearing mask due to covid 19 pandemic; no cough in exam room Abdominal:     General: Abdomen is flat.     Palpations: Abdomen is soft.  Musculoskeletal:        General: Swelling and tenderness present. No deformity or signs of injury. Normal range of motion.     Cervical back: Normal range of  motion and neck supple. No rigidity or tenderness.     Right knee: Normal.     Left knee: Normal.     Right lower leg: Normal. No edema.     Left lower leg: Normal. No edema.     Right ankle: Normal.     Left ankle: Normal.     Right foot: Normal range of motion and normal capillary refill. Swelling and tenderness present. No deformity, bunion, Charcot foot, foot drop, laceration, bony tenderness or crepitus. Normal pulse.     Left foot: Normal range of motion and normal capillary refill. Swelling and tenderness present. No deformity, bunion, Charcot foot, foot drop, laceration, bony tenderness or crepitus. Normal pulse.       Feet:     Comments: Bilateral canvas slip on shoes treads barely worn heels bilaterally and midsole  Feet:     Right foot:     Skin integrity: Callus present. No ulcer, blister, skin breakdown, erythema, warmth, dry skin or fissure.     Toenail Condition: Right toenails are normal.     Left foot:     Skin integrity: Callus present. No ulcer, blister, skin breakdown, erythema, warmth, dry skin or fissure.     Toenail Condition: Left toenails are normal.     Comments: Callous most prominent heels bilaterally; mild nonpitting 0-1+/4 edema midfoot plantar noted most notable over 2-3 MTPs; mildly TTP with deep palpation Lymphadenopathy:     Head:     Right side of head: No submental or preauricular adenopathy.     Left side of head: No submental or preauricular adenopathy.     Cervical: No cervical adenopathy.     Right cervical: No superficial cervical adenopathy.    Left cervical: No superficial cervical adenopathy.  Skin:    General: Skin is warm, dry and intact.     Capillary Refill: Capillary refill takes less than 2 seconds.     Coloration: Skin is not ashen, cyanotic, jaundiced, mottled, pale or sallow.     Findings: No abrasion, abscess, acne, bruising, burn, ecchymosis, erythema, signs of injury, laceration, lesion, petechiae, rash or wound.     Nails: There  is no clubbing.  Neurological:     General: No focal deficit present.     Mental Status: She is alert and oriented to person, place, and time. Mental status is at baseline.     GCS: GCS eye subscore is 4. GCS verbal subscore is 5. GCS motor subscore is 6.     Cranial Nerves: Cranial nerves are intact. No cranial nerve deficit, dysarthria or facial asymmetry.     Sensory: Sensation is intact. No sensory deficit.     Motor: Motor function is intact. No weakness, tremor, atrophy, abnormal muscle tone or seizure activity.     Coordination: Coordination is intact. Coordination normal.     Gait: Gait is intact. Gait normal.  Comments: Gait sure and steady in clinic; in/out of chair and on/off exam table without difficulty; bilateral hand grasp equal 5/5  Psychiatric:        Attention and Perception: Attention and perception normal.        Mood and Affect: Mood and affect, mood and affect normal.        Speech: Speech normal.        Behavior: Behavior normal. Behavior is cooperative.        Thought Content: Thought content normal.        Cognition and Memory: Cognition, memory and cognition and memory normal.        Judgment: Judgment normal.           Assessment & Plan:  A-acute left foot pain  P-Foot pain after long day at work.  Right foot starting to hurt now too as altering gait to decrease left foot pain.  Trained on crutches and given pair for use from clinic stock to return when no longer needed.  Fitted and distributed by NP [employer]  Refused tylenol and NSAIDS as stated stomach then hurts.  Will continue biofreeze gel QID topical prn use.  Consider buying another pair of shoes to rotate wear.  [facility] handouts on foot pain and plantar fasciitis rehab exercises printed and given to patient. Discussed achilles/gastrocnemius/foot/plantar stretches and icing 15 minutes at least nightly with frozen water bottle rolling foot over. Follow up with PCM if no improvement with discussed  care for podiatry referral. Consider new supportive footwear/OTC inserts if shoe treads worn out/greater than 3 year old.  Do not walk barefoot at home or thin leather no support sandals/flip flops/shoes as this can worsen condition/pain.  Consider compression socks and weight loss also.  Follow up in 1 week sooner if new or worsening symptoms despite plan of care.  Consider elevating feet when sitting.  Consider foot xray to rule out stress fracture if no improvement as 54y/o and walking her main exercise program.  DDx tendonitis, neuroma, muscle strain, plantar fasciitis, arthritis Patient verbalized understanding of instructions, agreed with plan of care and had no further questions at this time.

## 2020-08-05 NOTE — Patient Instructions (Signed)
Crutch Use, Adult Crutches are used to take weight off of one of your legs or feet when you stand or walk. You may need crutches to help you heal after an injury or procedure. It is important to use crutches that fit properly. When fitted properly: Each crutch should be 2-3 finger widths below the armpit. Your weight should be supported by your hand and not by resting your armpit on the crutch. It is important that a health care provider has seen you use crutches effectively before you use them at home. What are the risks? Improper use of crutches can injure your shoulders, arms, back, armpits, wrists, and hands. To prevent this from happening, make sure your crutches fit properly, and do not put pressure on your armpits when using the crutches. While using crutches, you also have a higher risk of falling. To prevent falls while using crutches at home or work: Move furniture or barriers that are in your walkway when possible. Have someone help you with this as needed. Remove rugs, cords, and other items that you can trip on. Have someone help you with this as needed. Keep walkways well lit. Use a backpack so you do not need to carry items in your hands. How to use your crutches How you use your crutches will depend on the reason you need them. Your health care provider may tell you not to put any weight on the affected leg (non-weight-bearing). Or your health care provider may allow you to put some, but not all, weight on the affected leg (partial weight-bearing). Follow instructions from your health care provider about weight-bearing. Do not bear weight in an amount that causes pain to the affected area. Walking Stand on your healthy leg and lift both crutches at the same time. Place the crutches one step-length in front of you. Keep your weight over the hand grips. Bring your healthy leg forward to meet, or land slightly ahead of, the crutches. Repeat.   Going up steps If there is no  handrail: Walk up to steps and put weight on hand grips to step up. Step up with the healthy leg. Step up with the crutches and injured leg. Repeat. If there is a handrail: Hold both crutches in one hand. Place your other hand on the handrail. Place your weight on your arms and step up with your healthy leg. Bring the crutches and the injured leg up to that step. Continue in this way. If you feel unsteady on steps, you can go up steps on your buttocks. To go up, sit on the lowest step with your injured leg in front and holding both crutches flat against the stairs in one hand. Then use your free hand and your healthy leg for support to scoot your buttocks up to the next step.   Going down steps If there is no handrail: Step down with the injured leg and crutches. Make sure to keep the crutch tips in the center of the step, not close to the front edge. Step down with the healthy leg. Repeat. If there is a handrail: Place one hand on the handrail. Hold both crutches with your free hand. Lower your injured leg and crutches to the step below you. Make sure to keep the crutch tips in the center of the step, not close to the front edge. Lower your healthy leg down to the next step. Repeat. If you feel unsteady on steps, you can go down steps on your buttocks. To go down, sit  on the highest step with your injured leg in front and holding both crutches flat against the stairs in the other hand. Then use your free hand and your healthy leg for support to scoot your buttocks down to the next step.   Standing up Move to the edge of the seat. If there is an armrest: Hold the injured leg forward. Grab the armrest with one hand and the top of the crutches with the other hand. Using the armrest and your crutches, pull yourself up to a standing position. If there is no armrest: Hold the injured leg forward. Hold on to the seat with one hand and the top of the crutches with the other hand. Using the  seat and your crutches, bring yourself up to a standing position. Sitting down Move back until your leg touches the edge of the seat. If there is an armrest: Hold the injured leg forward. Grab the armrest with one hand and the top of the crutches with the other hand. Slowly lower yourself to a sitting position. If there is no armrest: Hold the injured leg forward. Reach for and hold on to the seat with one hand and hold on to the top of the crutches with the other hand. Slowly lower yourself to a sitting position. Contact a health care provider if: You feel unsteady using crutches. You develop any new pain. You develop any numbness or tingling. Your crutches do not fit. Get help right away if: You fall. Summary Crutches are used when you need to take weight off of one of your legs or feet to stand or walk. To prevent injury, make sure your crutches fit properly, and do not put pressure on your armpits when using the crutches. Follow instructions from your health care provider about weight-bearing. This information is not intended to replace advice given to you by your health care provider. Make sure you discuss any questions you have with your health care provider. Document Revised: 01/10/2019 Document Reviewed: 01/10/2019 Elsevier Patient Education  [place]. Plantar Fasciitis Rehab Ask your health care provider which exercises are safe for you. Do exercises exactly as told by your health care provider and adjust them as directed. It is normal to feel mild stretching, pulling, tightness, or discomfort as you do these exercises. Stop right away if you feel sudden pain or your pain gets worse. Do not begin these exercises until told by your health care provider. Stretching and range-of-motion exercises These exercises warm up your muscles and joints and improve the movement and flexibility of your foot. These exercises also help to relieve pain. Plantar fascia stretch Sit with  your left / right leg crossed over your opposite knee. Hold your heel with one hand with that thumb near your arch. With your other hand, hold your toes and gently pull them back toward the top of your foot. You should feel a stretch on the base (bottom) of your toes, or the bottom of your foot (plantar fascia), or both. Hold this stretch for____15______ seconds. Slowly release your toes and return to the starting position. Repeat ____3______ times. Complete this exercise ______3____ times a day.   Gastrocnemius stretch, standing This exercise is also called a calf (gastroc) stretch. It stretches the muscles in the back of the upper calf. Stand with your hands against a wall. Extend your left / right leg behind you, and bend your front knee slightly. Keeping your heels on the floor, your toes facing forward, and your back knee  straight, shift your weight toward the wall. Do not arch your back. You should feel a gentle stretch in your upper calf. Hold this position for ____15______ seconds. Repeat ____3______ times. Complete this exercise ___3_______ times a day.   Soleus stretch, standing This exercise is also called a calf (soleus) stretch. It stretches the muscles in the back of the lower calf. Stand with your hands against a wall. Extend your left / right leg behind you, and bend your front knee slightly. Keeping your heels on the floor and your toes facing forward, bend your back knee and shift your weight slightly over your back leg. You should feel a gentle stretch deep in your lower calf. Hold this position for _____15_____ seconds. Repeat ____3______ times. Complete this exercise ____3______ times a day. Gastroc and soleus stretch, standing step This exercise stretches the muscles in the back of the lower leg. These muscles are in the upper calf (gastrocnemius) and the lower calf (soleus). Stand with the ball of your left / right foot on the front of a step. The ball of your foot is on  the walking surface, right under your toes. Keep your other foot firmly on the same step. Hold on to the wall or a railing for balance. Slowly lift your other foot, allowing your body weight to press your heel down over the edge of the front of the step. Keep knee straight and unbent. You should feel a stretch in your calf. Hold this position for ___15_______ seconds. Return both feet to the step. Repeat this exercise with a slight bend in your left / right knee. Repeat ____3______ times with your left / right knee straight and _____3_____ times with your left / right knee bent. Complete this exercise ____3______ times a day. Balance exercise This exercise builds your balance and strength control of your arch to help take pressure off your plantar fascia. Single leg stand If this exercise is too easy, you can try it with your eyes closed or while standing on a pillow. Without shoes, stand near a railing or in a doorway. You may hold on to the railing or door frame as needed. Stand on your left / right foot. Keep your big toe down on the floor and lift the arch of your foot. You should feel a stretch across the bottom of your foot and your arch. Do not let your foot roll inward. Hold this position for __15________ seconds. Repeat _____3_____ times. Complete this exercise __________ times a day. This information is not intended to replace advice given to you by your health care provider. Make sure you discuss any questions you have with your health care provider. Document Revised: 04/03/2020 Document Reviewed: 04/03/2020 Elsevier Patient Education  [place] [facility] Many things can cause foot pain. Some common causes are:  An injury.  A sprain.  Arthritis.  Blisters.  Bunions. Follow these instructions at home: Managing pain, stiffness, and swelling If directed, put ice on the painful area:  Put ice in a plastic bag.  Place a towel between your skin and the  bag.  Leave the ice on for 20 minutes, 2-3 times a day.   Activity  Do not stand or walk for long periods.  Return to your normal activities as told by your health care provider. Ask your health care provider what activities are safe for you.  Do stretches to relieve foot pain and stiffness as told by your health care provider.  Do not lift anything  that is heavier than 10 lb (4.5 kg), or the limit that you are told, until your health care provider says that it is safe. Lifting a lot of weight can put added pressure on your feet. Lifestyle  Wear comfortable, supportive shoes that fit you well. Do not wear high heels.  Keep your feet clean and dry. General instructions  Take over-the-counter and prescription medicines only as told by your health care provider.  Rub your foot gently.  Pay attention to any changes in your symptoms.  Keep all follow-up visits as told by your health care provider. This is important. Contact a health care provider if:  Your pain does not get better after a few days of self-care.  Your pain gets worse.  You cannot stand on your foot. Get help right away if:  Your foot is numb or tingling.  Your foot or toes are swollen.  Your foot or toes turn white or blue.  You have warmth and redness along your foot. Summary  Common causes of foot pain are injury, sprain, arthritis, blisters or bunions.  Ice, medicines, and comfortable shoes may help foot pain.  Contact your health care provider if your pain does not get better after a few days of self-care. This information is not intended to replace advice given to you by your health care provider. Make sure you discuss any questions you have with your health care provider. Document Revised: 04/06/2018 Document Reviewed: 04/06/2018 Elsevier Patient Education  2021 [employer].

## 2020-08-05 NOTE — Telephone Encounter (Signed)
You're most welcome!

## 2020-08-12 ENCOUNTER — Telehealth: Payer: Self-pay | Admitting: *Deleted

## 2020-08-12 NOTE — Telephone Encounter (Signed)
Pt in to clinic returning crutches. Reports foot pain is still present but improved. Using Biofreeze and ice at home and took off work [employer] and Fri and this helped. Denies further needs at this time.

## 2020-08-13 ENCOUNTER — Encounter: Payer: Self-pay | Admitting: *Deleted

## 2020-08-13 NOTE — Telephone Encounter (Signed)
Reviewed RN Hildred Alamin note.  Patient seen briefly in clinic yesterday gait sure and steady.  Patient reported rested at home last week, ice, elevated foot and feeling better this week at work and no longer required crutches.  Returned loaned crutches to clinic.  A&Ox3 skin warm dry and pink respirations even and unlabored RA no further questions or concerns from patient follow up prn new or worsening foot pain/symptoms.  Patient verbalized understanding information/instructions, agreed with plan of care and had no further questions at that time.

## 2020-10-03 ENCOUNTER — Ambulatory Visit (AMBULATORY_SURGERY_CENTER): Payer: PRIVATE HEALTH INSURANCE | Admitting: Gastroenterology

## 2020-10-03 ENCOUNTER — Encounter: Payer: Self-pay | Admitting: Gastroenterology

## 2020-10-03 ENCOUNTER — Other Ambulatory Visit: Payer: Self-pay

## 2020-10-03 VITALS — BP 111/80 | HR 68 | Temp 97.1°F | Resp 17 | Ht 61.0 in | Wt 149.0 lb

## 2020-10-03 DIAGNOSIS — K319 Disease of stomach and duodenum, unspecified: Secondary | ICD-10-CM | POA: Diagnosis not present

## 2020-10-03 DIAGNOSIS — K225 Diverticulum of esophagus, acquired: Secondary | ICD-10-CM

## 2020-10-03 DIAGNOSIS — K3189 Other diseases of stomach and duodenum: Secondary | ICD-10-CM

## 2020-10-03 DIAGNOSIS — R935 Abnormal findings on diagnostic imaging of other abdominal regions, including retroperitoneum: Secondary | ICD-10-CM

## 2020-10-03 HISTORY — PX: ESOPHAGOGASTRODUODENOSCOPY: SHX1529

## 2020-10-03 MED ORDER — SODIUM CHLORIDE 0.9 % IV SOLN: 500.0000 mL | Freq: Once | INTRAVENOUS | Status: DC

## 2020-10-03 NOTE — Op Note (Signed)
[facility] Patient Name: Isabella Robertson Procedure Date: 10/03/2020 8:02 AM MRN: [id number] Endoscopist: Thornton Park MD, MD Age: 55 Referring MD:  Date of Birth: 10/18/1965 Gender: Female Account #: [id number] Procedure:                Upper GI endoscopy Indications:              Abnormal CT of the GI tract                           - CT Enteroscopy 07/01/20 showed gastritis                           Abdominal pain - ? 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 The patient's tolerance of                            previous anesthesia was also reviewed. The risks                            and benefits of the procedure and the sedation                            options and risks were discussed with the patient.                            All questions were answered, and informed consent  was obtained. Prior Anticoagulants: The patient has                            taken no previous anticoagulant or antiplatelet                            agents. ASA Grade Assessment: II - A patient with                            mild systemic disease. After reviewing the risks                            and benefits, the patient was deemed in                            satisfactory condition to undergo the procedure.                           After obtaining informed consent, the endoscope was                             passed under direct vision. Throughout the                            procedure, the patient's blood pressure, pulse, and                            oxygen saturations were monitored continuously. The                            Endoscope was introduced through the mouth, and                            advanced to the third part of duodenum. The upper                            GI endoscopy was accomplished without difficulty.                            The patient tolerated the procedure well. Scope In: Scope Out: Findings:                 The examined esophagus was normal.                           The Z-line was regular and was found 37 cm from the                            incisors.                           Diffuse mild mucosal changes characterized by an                            increased vascular  pattern were found in the entire                            examined stomach. Biopsies were taken from the                            antrum, incisure, body, and fundus with a cold                            forceps for histology. Estimated blood loss was                            minimal.                           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 Complications:            No immediate complications. Estimated blood loss:                            Minimal. Estimated Blood Loss:     Estimated blood loss was minimal. Impression:               - Normal esophagus.                           - Increased vascular pattern mucosa in the stomach.                            Biopsied.                           - Duodenal diverticulum. Recommendation:           - Patient has a contact number available for                            emergencies. The signs and symptoms of potential                            delayed complications were discussed with the                            patient. Return to normal  activities tomorrow.                            Written discharge instructions were provided to the                            patient.                           - Resume previous diet.                           -  Continue present medications.                           - Await pathology results. Thornton Park MD, MD 10/03/2020 8:24:35 AM This report has been signed electronically.

## 2020-10-03 NOTE — Progress Notes (Signed)
PT taken to PACU. Monitors in place. VSS. Report given to RN. 

## 2020-10-03 NOTE — Progress Notes (Signed)
Called to room to assist during endoscopic procedure.  Patient ID and intended procedure confirmed with present staff. Received instructions for my participation in the procedure from the performing physician.  

## 2020-10-03 NOTE — Patient Instructions (Signed)
Resume previous diet Continue current medications Await pathology results  YOU HAD AN ENDOSCOPIC PROCEDURE TODAY AT THE [facility]:   Refer to the procedure report that was given to you for any specific questions about what was found during the examination.  If the procedure report does not answer your questions, please call your gastroenterologist to clarify.  If you requested that your care partner not be given the details of your procedure findings, then the procedure report has been included in a sealed envelope for you to review at your convenience later.  YOU SHOULD EXPECT: Some feelings of bloating in the abdomen. Passage of more gas than usual.  Walking can help get rid of the air that was put into your GI tract during the procedure and reduce the bloating. If you had a lower endoscopy (such as a colonoscopy or flexible sigmoidoscopy) you may notice spotting of blood in your stool or on the toilet paper. If you underwent a bowel prep for your procedure, you may not have a normal bowel movement for a few days.  Please Note:  You might notice some irritation and congestion in your nose or some drainage.  This is from the oxygen used during your procedure.  There is no need for concern and it should clear up in a day or so.  SYMPTOMS TO REPORT IMMEDIATELY:   Following upper endoscopy (EGD)  Vomiting of blood or coffee ground material  New chest pain or pain under the shoulder blades  Painful or persistently difficult swallowing  New shortness of breath  Fever of 100F or higher  Black, tarry-looking stools  For urgent or emergent issues, a gastroenterologist can be reached at any hour by calling [phone]. Do not use MyChart messaging for urgent concerns.    DIET:  We do recommend a small meal at first, but then you may proceed to your regular diet.  Drink plenty of fluids but you should avoid alcoholic beverages for 24 hours.  ACTIVITY:  You should plan to take it  easy for the rest of today and you should NOT DRIVE or use heavy machinery until tomorrow (because of the sedation medicines used during the test).    FOLLOW UP: Our staff will call the number listed on your records 48-72 hours following your procedure to check on you and address any questions or concerns that you may have regarding the information given to you following your procedure. If we do not reach you, we will leave a message.  We will attempt to reach you two times.  During this call, we will ask if you have developed any symptoms of COVID 19. If you develop any symptoms (ie: fever, flu-like symptoms, shortness of breath, cough etc.) before then, please call [phone].  If you test positive for Covid 19 in the 2 weeks post procedure, please call and report this information to us.    If any biopsies were taken you will be contacted by phone or by letter within the next 1-3 weeks.  Please call us at [phone] if you have not heard about the biopsies in 3 weeks.    SIGNATURES/CONFIDENTIALITY: You and/or your care partner have signed paperwork which will be entered into your electronic medical record.  These signatures attest to the fact that that the information above on your After Visit Summary has been reviewed and is understood.  Full responsibility of the confidentiality of this discharge information lies with you and/or your care-partner. 

## 2020-10-03 NOTE — Progress Notes (Signed)
Medical history reviewed. VS assessed by C.W 

## 2020-10-07 ENCOUNTER — Telehealth: Payer: Self-pay

## 2020-10-07 NOTE — Telephone Encounter (Signed)
  Follow up Call-  Call back number 10/03/2020 04/08/2020  Post procedure Call Back phone  # [phone]  Permission to leave phone message Yes Yes  Some recent data might be hidden     Patient questions:  Do you have a fever, pain , or abdominal swelling? No. Pain Score  0 *  Have you tolerated food without any problems? Yes.    Have you been able to return to your normal activities? Yes.    Do you have any questions about your discharge instructions: Diet   No. Medications  No. Follow up visit  No.  Do you have questions or concerns about your Care? No.  Actions: * If pain score is 4 or above: No action needed, pain <4.

## 2020-10-20 ENCOUNTER — Ambulatory Visit: Payer: Self-pay | Admitting: *Deleted

## 2020-10-20 ENCOUNTER — Other Ambulatory Visit: Payer: Self-pay

## 2020-10-20 VITALS — BP 95/75 | HR 70 | Ht 62.0 in | Wt 145.0 lb

## 2020-10-20 DIAGNOSIS — Z0000 Encounter for general adult medical examination without abnormal findings: Secondary | ICD-10-CM

## 2020-10-20 DIAGNOSIS — Z79899 Other long term (current) drug therapy: Secondary | ICD-10-CM

## 2020-10-20 MED ORDER — ALBUTEROL SULFATE HFA 108 (90 BASE) MCG/ACT IN AERS: 1.0000 | INHALATION_SPRAY | RESPIRATORY_TRACT | 1 refills | Status: DC | PRN

## 2020-10-20 NOTE — Addendum Note (Signed)
Addended by: Beckie Busing on: 10/20/2020 09:29 AM   Modules accepted: Orders

## 2020-10-20 NOTE — Progress Notes (Signed)
[employer] insurance premium discount evaluation: Labs Drawn. Replacements ROI form signed. Tobacco Free Attestation form signed.  Forms placed in paper chart.  

## 2020-10-21 ENCOUNTER — Encounter: Payer: Self-pay | Admitting: Registered Nurse

## 2020-10-21 LAB — CMP12+LP+TP+TSH+6AC+CBC/D/PLT
ALT: 18 IU/L (ref 0–32)
AST: 17 IU/L (ref 0–40)
Albumin/Globulin Ratio: 1.6 (ref 1.2–2.2)
Albumin: 4.8 g/dL (ref 3.8–4.9)
Alkaline Phosphatase: 71 IU/L (ref 44–121)
BUN/Creatinine Ratio: 16 (ref 9–23)
BUN: 14 mg/dL (ref 6–24)
Basophils Absolute: 0.1 10*3/uL (ref 0.0–0.2)
Basos: 2 %
Bilirubin Total: 0.5 mg/dL (ref 0.0–1.2)
Calcium: 9.4 mg/dL (ref 8.7–10.2)
Chloride: 101 mmol/L (ref 96–106)
Chol/HDL Ratio: 4.7 ratio — ABNORMAL HIGH (ref 0.0–4.4)
Cholesterol, Total: 230 mg/dL — ABNORMAL HIGH (ref 100–199)
Creatinine, Ser: 0.88 mg/dL (ref 0.57–1.00)
EOS (ABSOLUTE): 0.5 10*3/uL — ABNORMAL HIGH (ref 0.0–0.4)
Eos: 13 %
Estimated CHD Risk: 1.2 times avg. — ABNORMAL HIGH (ref 0.0–1.0)
Free Thyroxine Index: 1.9 (ref 1.2–4.9)
GGT: 20 IU/L (ref 0–60)
Globulin, Total: 3 g/dL (ref 1.5–4.5)
Glucose: 80 mg/dL (ref 65–99)
HDL: 49 mg/dL (ref >39)
Hematocrit: 43.2 % (ref 34.0–46.6)
Hemoglobin: 14.6 g/dL (ref 11.1–15.9)
Immature Grans (Abs): 0 10*3/uL (ref 0.0–0.1)
Immature Granulocytes: 0 %
Iron: 79 ug/dL (ref 27–159)
LDH: 162 IU/L (ref 119–226)
LDL Chol Calc (NIH): 147 mg/dL — ABNORMAL HIGH (ref 0–99)
Lymphocytes Absolute: 1.7 10*3/uL (ref 0.7–3.1)
Lymphs: 44 %
MCH: 30.6 pg (ref 26.6–33.0)
MCHC: 33.8 g/dL (ref 31.5–35.7)
MCV: 91 fL (ref 79–97)
Monocytes Absolute: 0.3 10*3/uL (ref 0.1–0.9)
Monocytes: 8 %
Neutrophils Absolute: 1.3 10*3/uL — ABNORMAL LOW (ref 1.4–7.0)
Neutrophils: 33 %
Phosphorus: 3.8 mg/dL (ref 3.0–4.3)
Platelets: 283 10*3/uL (ref 150–450)
Potassium: 4.1 mmol/L (ref 3.5–5.2)
RBC: 4.77 x10E6/uL (ref 3.77–5.28)
RDW: 12.1 % (ref 11.7–15.4)
Sodium: 140 mmol/L (ref 134–144)
T3 Uptake Ratio: 27 % (ref 24–39)
T4, Total: 7 ug/dL (ref 4.5–12.0)
TSH: 1.4 u[IU]/mL (ref 0.450–4.500)
Total Protein: 7.8 g/dL (ref 6.0–8.5)
Triglycerides: 188 mg/dL — ABNORMAL HIGH (ref 0–149)
Uric Acid: 6.1 mg/dL (ref 3.0–7.2)
VLDL Cholesterol Cal: 34 mg/dL (ref 5–40)
WBC: 3.9 10*3/uL (ref 3.4–10.8)
eGFR: 78 mL/min/{1.73_m2} (ref >59)

## 2020-10-21 LAB — VITAMIN D 25 HYDROXY (VIT D DEFICIENCY, FRACTURES): Vit D, 25-Hydroxy: 15.1 ng/mL — ABNORMAL LOW (ref 30.0–100.0)

## 2020-10-21 LAB — MAGNESIUM: Magnesium: 2.1 mg/dL (ref 1.6–2.3)

## 2020-10-21 LAB — HGB A1C W/O EAG: Hgb A1c MFr Bld: 5.3 % (ref 4.8–5.6)

## 2020-10-24 ENCOUNTER — Telehealth: Payer: Self-pay | Admitting: *Deleted

## 2020-10-24 DIAGNOSIS — E559 Vitamin D deficiency, unspecified: Secondary | ICD-10-CM

## 2020-10-24 NOTE — Telephone Encounter (Signed)
Pt came to clinic to give pharmacy preference for Vitamin D Rx. [facility] [address] [facility]ina Rabon.   Reviewed remainder of labs and handouts with pt as well. She denies further questions or concerns.

## 2020-10-26 DIAGNOSIS — E559 Vitamin D deficiency, unspecified: Secondary | ICD-10-CM | POA: Insufficient documentation

## 2020-10-26 MED ORDER — CHOLECALCIFEROL 1.25 MG (50000 UT) PO TABS: 1.0000 | ORAL_TABLET | ORAL | 0 refills | Status: AC

## 2020-10-26 NOTE — Telephone Encounter (Signed)
Reviewed RN Hildred Alamin note and Rx entered/sent to patient pharmacy of choice.

## 2020-10-28 ENCOUNTER — Encounter: Payer: Self-pay | Admitting: Family Medicine

## 2020-11-03 ENCOUNTER — Telehealth: Payer: PRIVATE HEALTH INSURANCE | Admitting: Family Medicine

## 2020-11-04 ENCOUNTER — Other Ambulatory Visit: Payer: Self-pay

## 2020-11-04 ENCOUNTER — Ambulatory Visit: Payer: Self-pay | Admitting: Registered Nurse

## 2020-11-04 ENCOUNTER — Telehealth: Payer: Self-pay | Admitting: Registered Nurse

## 2020-11-04 VITALS — BP 92/79 | HR 90 | Temp 98.6°F

## 2020-11-04 DIAGNOSIS — H6993 Unspecified Eustachian tube disorder, bilateral: Secondary | ICD-10-CM

## 2020-11-04 DIAGNOSIS — H6983 Other specified disorders of Eustachian tube, bilateral: Secondary | ICD-10-CM

## 2020-11-04 DIAGNOSIS — J0101 Acute recurrent maxillary sinusitis: Secondary | ICD-10-CM

## 2020-11-04 DIAGNOSIS — J301 Allergic rhinitis due to pollen: Secondary | ICD-10-CM

## 2020-11-04 DIAGNOSIS — J209 Acute bronchitis, unspecified: Secondary | ICD-10-CM

## 2020-11-04 MED ORDER — PREDNISONE 10 MG (21) PO TBPK: ORAL_TABLET | ORAL | 0 refills | Status: DC

## 2020-11-04 MED ORDER — BENZONATATE 200 MG PO CAPS: 200.0000 mg | ORAL_CAPSULE | Freq: Three times a day (TID) | ORAL | 0 refills | Status: AC | PRN

## 2020-11-04 NOTE — Patient Instructions (Signed)
Cough, Adult Coughing is a reflex that clears your throat and your airways (respiratory system). Coughing helps to heal and protect your lungs. It is normal to cough occasionally, but a cough that happens with other symptoms or lasts a long time may be a sign of a condition that needs treatment. An acute cough may only last 2-3 weeks, while a chronic cough may last 8 or more weeks. Coughing is commonly caused by:  Infection of the respiratory systemby viruses or bacteria.  Breathing in substances that irritate your lungs.  Allergies.  Asthma.  Mucus that runs down the back of your throat (postnasal drip).  Smoking.  Acid backing up from the stomach into the esophagus (gastroesophageal reflux).  Certain medicines.  Chronic lung problems.  Other medical conditions such as heart failure or a blood clot in the lung (pulmonary embolism). Follow these instructions at home: Medicines  Take over-the-counter and prescription medicines only as told by your health care provider.  Talk with your health care provider before you take a cough suppressant medicine. Lifestyle  Avoid cigarette smoke. Do not use any products that contain nicotine or tobacco, such as cigarettes, e-cigarettes, and chewing tobacco. If you need help quitting, ask your health care provider.  Drink enough fluid to keep your urine pale yellow.  Avoid caffeine.  Do not drink alcohol if your health care provider tells you not to drink.   General instructions  Pay close attention to changes in your cough. Tell your health care provider about them.  Always cover your mouth when you cough.  Avoid things that make you cough, such as perfume, candles, cleaning products, or campfire or tobacco smoke.  If the air is dry, use a cool mist vaporizer or humidifier in your bedroom or your home to help loosen secretions.  If your cough is worse at night, try to sleep in a semi-upright position.  Rest as needed.  Keep all  follow-up visits as told by your health care provider. This is important.   Contact a health care provider if you:  Have new symptoms.  Cough up pus.  Have a cough that does not get better after 2-3 weeks or gets worse.  Cannot control your cough with cough suppressant medicines and you are losing sleep.  Have pain that gets worse or pain that is not helped with medicine.  Have a fever.  Have unexplained weight loss.  Have night sweats. Get help right away if:  You cough up blood.  You have difficulty breathing.  Your heartbeat is very fast. These symptoms may represent a serious problem that is an emergency. Do not wait to see if the symptoms will go away. Get medical help right away. Call your local emergency services (911 in the [place] Do not drive yourself to the hospital. Summary  Coughing is a reflex that clears your throat and your airways. It is normal to cough occasionally, but a cough that happens with other symptoms or lasts a long time may be a sign of a condition that needs treatment.  Take over-the-counter and prescription medicines only as told by your health care provider.  Always cover your mouth when you cough.  Contact a health care provider if you have new symptoms or a cough that does not get better after 2-3 weeks or gets worse. This information is not intended to replace advice given to you by your health care provider. Make sure you discuss any questions you have with your health care provider. Document   Revised: 07/10/2018 Document Reviewed: 07/10/2018 Elsevier Patient Education  2021 [place] [employer] [place], 190, P5-P6. Retrieved from https://[email or web address]/patients/[email or web address]-[email or web address]/[email or web address]-inhaler-mdi.pdf">  How to Use a Metered Dose Inhaler  A metered dose inhaler (MDI) is a handheld device filled with medicine that must be breathed into the lungs (inhaled). The medicine is delivered  by pushing down on a metal canister. This releases a preset amount of spray and mist through the mouth and into the lungs. Each MDI canister holds a certain number of doses (puffs). Using a spacer with a metered dose inhaler may be recommended to help get more medicine into the lungs. A spacer is a plastic tube that connects to the MDI on one end and has a mouthpiece on the other end. A spacer holds the medicine in the tube for a short time. This allows more medicine to be inhaled. The MDI can be used to deliver many kinds of inhaled medicines, including:  Quick relief or rescue medicines, such as bronchodilators.  Controller medicines, such as corticosteroids. What are the risks?  If you do not use your inhaler correctly, medicine might not reach your lungs to help you breathe.  If you do not have enough strength to push down the canister to make it spray, ask your health care provider for ways to help.  The medicine in the MDI may cause side effects, such as: ? Mouth sores (thrush). ? Cough. ? Hoarseness. ? Shakiness. ? Headache. Supplies needed:  A metered dose inhaler.  A spacer, if recommended. How to use a metered dose inhaler without a spacer 1. Remove the cap from the inhaler. 2. If you are using the inhaler for the first time, shake it for 5 seconds, turn it away from your face, then release 4 puffs into the air. This is called priming. 3. Shake the inhaler for 5 seconds. 4. Position the inhaler so the top of the canister faces up. 5. Put your index finger on the top of the medicine canister. Support the bottom of the inhaler with your thumb. 6. Breathe out normally and as completely as possible, away from the inhaler. 7. Either place the inhaler between your teeth and close your lips tightly around the mouthpiece, or hold the inhaler 1-2 inches (2.5-5 cm) away from your open mouth. Keep your tongue down out of the way. If you are unsure which technique to use, ask your health  care provider. 8. Press the canister down with your index finger to release the medicine. Inhale deeply and slowly through your mouth until your lungs are completely filled. Do not breathe in through your nose. Inhaling should take 4-6 seconds. 9. Hold the medicine in your lungs for 5-10 seconds (10 seconds is best). This helps the medicine get into the small airways of your lungs. 10. Remove the inhaler from your mouth, turn your head, and breathe out normally. 11. Wait about 1 minute between puffs or as directed. Then repeat steps 3-10 until you have taken the number of puffs that your health care provider directed. 12. Put the cap on the inhaler. 13. If you are using a steroid inhaler, rinse your mouth with water, gargle, and spit out the water. Do not swallow the water.   How to use a metered dose inhaler with a spacer 1. Remove the cap from the inhaler. 2. If you are using the inhaler for the first time, shake it for 5 seconds, turn it away from your face, then release 4 puffs  into the air. This is called priming. 3. Shake the inhaler for 5 seconds. 4. Place the open end of the spacer onto the inhaler mouthpiece. 5. Position the inhaler so the top of the canister faces up and the spacer mouthpiece faces you. 6. Put your index finger on the top of the medicine canister. Support the bottom of the inhaler and the spacer with your thumb. 7. Breathe out normally and as completely as possible, away from the spacer. 8. Place the spacer between your teeth and close your lips tightly around it. Keep your tongue down out of the way. 9. Press the canister down with your index finger to release the medicine, then inhale deeply and slowly through your mouth until your lungs are completely filled. Do not breathe in through your nose. Inhaling should take 4-6 seconds. 10. Hold the medicine in your lungs for 5-10 seconds (10 seconds is best). This helps the medicine get into the small airways of your  lungs. 11. Remove the spacer from your mouth, turn your head, and breathe out normally. 12. Wait about 1 minute between puffs or as directed. Then repeat steps 3-11 until you have taken the number of puffs that your health care provider directed. 13. Remove the spacer from the inhaler and put the cap on the inhaler. 14. If you are using a steroid inhaler, rinse your mouth with water, gargle, and spit out the water. Do not swallow the water.   Follow these instructions at home: Caring for your MDI  Store your inhaler at or near room temperature. A cold MDI will not work properly.  Follow directions on the package insert for care and cleaning of your MDI and spacer. General instructions  Take your inhaled medicine only as told by your health care provider. Do not use the inhaler more than directed by your health care provider.  Refill your MDI with medicine before all the preset doses have been used. ? If your inhaler has a counter, check it to determine how full your MDI is. The number you see tells you how many doses are left. ? If your inhaler does not have a counter, ask your health care provider when you will need to refill it. Then write the refill date on a calendar or on your MDI canister. ? Keep in mind that you cannot tell when the medicine in an inhaler is empty by shaking it. You may feel or hear something in the canister even when the preset medicine doses have been used up. Keeping track of your dosages is important.  Do not use any products that contain nicotine or tobacco, such as cigarettes, e-cigarettes, and chewing tobacco. If you need help quitting, ask your health care provider.  Keep all follow-up visits as told by your health care provider. This is important. Where to find more information  Centers for Disease Control and Prevention: [employer].com.brwww.cdc.gov  American Lung Association: www.lung.org Contact a health care provider if:  Symptoms are only partially relieved with  your inhaler.  You are having trouble using your inhaler.  You have side effects from the medicine.  You have chills or a fever.  You have night sweats.  There is blood in your thick saliva (phlegm). Get help right away if:  You have dizziness.  You have a fast heart rate.  You have severe shortness of breath.  You have difficulty breathing. These symptoms may represent a serious problem that is an emergency. Do not wait to see if the  symptoms will go away. Get medical help right away. Call your local emergency services (911 in the [place] Do not drive yourself to the hospital. Summary  A metered dose inhaler is a handheld device for taking medicine that must be breathed into the lungs (inhaled).  Take your inhaled medicine only as told by your health care provider. Do not use the inhaler more than directed by your health care provider.  You cannot tell when the medicine is gone in an inhaler by shaking it. Refill it with medicine before all the preset doses have been used.  Follow directions on the package insert for care and cleaning of your MDI and spacer. This information is not intended to replace advice given to you by your health care provider. Make sure you discuss any questions you have with your health care provider. Document Revised: 08/07/2019 Document Reviewed: 08/07/2019 Elsevier Patient Education  2021 [place]. Acute Bronchitis, Adult  Acute bronchitis is sudden or acute swelling of the air tubes (bronchi) in the lungs. Acute bronchitis causes these tubes to fill with mucus, which can make it hard to breathe. It can also cause coughing or wheezing. In adults, acute bronchitis usually goes away within 2 weeks. A cough caused by bronchitis may last up to 3 weeks. Smoking, allergies, and asthma can make the condition worse. What are the causes? This condition can be caused by germs and by substances that irritate the lungs, including:  Cold and flu viruses.  The most common cause of this condition is the virus that causes the common cold.  Bacteria.  Substances that irritate the lungs, including: ? Smoke from cigarettes and other forms of tobacco. ? Dust and pollen. ? Fumes from chemical products, gases, or burned fuel. ? Other materials that pollute indoor or outdoor air.  Close contact with someone who has acute bronchitis. What increases the risk? The following factors may make you more likely to develop this condition:  A weak body's defense system, also called the immune system.  A condition that affects your lungs and breathing, such as asthma. What are the signs or symptoms? Common symptoms of this condition include:  Lung and breathing problems, such as: ? Coughing. This may bring up clear, yellow, or green mucus from your lungs (sputum). ? Wheezing. ? Having too much mucus in your lungs (chest congestion). ? Having shortness of breath.  A fever.  Chills.  Aches and pains, including: ? Tightness in your chest and other body aches. ? A sore throat. How is this diagnosed? This condition is usually diagnosed based on:  Your symptoms and medical history.  A physical exam. You may also have other tests, including tests to rule out other conditions, such pneumonia. These tests include:  A test of lung function.  Test of a mucus sample to look for the presence of bacteria.  Tests to check the oxygen level in your blood.  Blood tests.  Chest X-ray. How is this treated? Most cases of acute bronchitis clear up over time without treatment. Your health care provider may recommend:  Drinking more fluids. This can thin your mucus, which may improve your breathing.  Taking a medicine for a fever or cough.  Using a device that gets medicine into your lungs (inhaler) to help improve breathing and control coughing.  Using a vaporizer or a humidifier. These are machines that add water to the air to help you breathe  better. Follow these instructions at home: Activity  Get plenty of rest.  Return to your normal activities as told by your health care provider. Ask your health care provider what activities are safe for you. Lifestyle  Drink enough fluid to keep your urine pale yellow.  Do not drink alcohol.  Do not use any products that contain nicotine or tobacco, such as cigarettes, e-cigarettes, and chewing tobacco. If you need help quitting, ask your health care provider. Be aware that: ? Your bronchitis will get worse if you smoke or breathe in other people's smoke (secondhand smoke). ? Your lungs will heal faster if you quit smoking. General instructions  Take over-the-counter and prescription medicines only as told by your health care provider.  Use an inhaler, vaporizer, or humidifier as told by your health care provider.  If you have a sore throat, gargle with a salt-water mixture 3-4 times a day or as needed. To make a salt-water mixture, completely dissolve -1 tsp (3-6 g) of salt in 1 cup (237 mL) of warm water.  Keep all follow-up visits as told by your health care provider. This is important.   How is this prevented? To lower your risk of getting this condition again:  Wash your hands often with soap and water. If soap and water are not available, use hand sanitizer.  Avoid contact with people who have cold symptoms.  Try not to touch your mouth, nose, or eyes with your hands.  Avoid places where there are fumes from chemicals. Breathing these fumes will make your condition worse.  Get the flu shot every year.   Contact a health care provider if:  Your symptoms do not improve after 2 weeks of treatment.  You vomit more than once or twice.  You have symptoms of dehydration such as: ? Dark urine. ? Dry skin or eyes. ? Increased thirst. ? Headaches. ? Confusion. ? Muscle cramps. Get help right away if you:  Cough up blood.  Feel pain in your chest.  Have severe  shortness of breath.  Faint or keep feeling like you are going to faint.  Have a severe headache.  Have fever or chills that get worse. These symptoms may represent a serious problem that is an emergency. Do not wait to see if the symptoms will go away. Get medical help right away. Call your local emergency services (911 in the [place] Do not drive yourself to the hospital. Summary  Acute bronchitis is sudden (acute) inflammation of the air tubes (bronchi) between the windpipe and the lungs. In adults, acute bronchitis usually goes away within 2 weeks, although coughing may last 3 weeks or longer  Take over-the-counter and prescription medicines only as told by your health care provider.  Drink enough fluid to keep your urine pale yellow.  Contact a health care provider if your symptoms do not improve after 2 weeks of treatment.  Get help right away if you cough up blood, faint, or have chest pain or shortness of breath. This information is not intended to replace advice given to you by your health care provider. Make sure you discuss any questions you have with your health care provider. Document Revised: 03/05/2019 Document Reviewed: 01/12/2019 Elsevier Patient Education  [place].

## 2020-11-04 NOTE — Progress Notes (Signed)
Subjective:    Patient ID: Isabella Robertson, female    DOB: 01-16-1966, 55 y.o.   MRN: [id number]  Pt c/o dry cough that has been present x1 month. Is pretty constant through the day and worsens at night when she lies flat. Has been using Albuterol inh every 3 hours. Using menthol cough drops. Cough keeping her awake at night.   Bought menthol cream yesterday and it helped her to sleep last night. Using cough drops helping some.  Does notice wheezing at times inhaler helps.  Last inhaler use 4 hours ago.  Wearing mask when at work or out in public due to covid.  Using her albuterol inhaler, taking allergy medications.  This time of year she typically needs antibiotics and steroids if sinus/cough gets bad.  Denied GERD symptoms into throat or regurgitation into back of throat.       Review of Systems  Constitutional: Positive for fatigue. Negative for activity change, appetite change, chills, diaphoresis and fever.  HENT: Positive for congestion, postnasal drip, rhinorrhea, sinus pressure, sinus pain and sneezing. Negative for drooling, ear discharge, ear pain, facial swelling, hearing loss, mouth sores, nosebleeds, sore throat, tinnitus, trouble swallowing and voice change.   Eyes: Negative for photophobia and visual disturbance.  Respiratory: Positive for cough, chest tightness and shortness of breath. Negative for choking, wheezing and stridor.   Cardiovascular: Negative for palpitations.  Gastrointestinal: Negative for diarrhea, nausea and vomiting.  Endocrine: Negative for cold intolerance and heat intolerance.  Genitourinary: Negative for difficulty urinating.  Musculoskeletal: Negative for gait problem, neck pain and neck stiffness.  Allergic/Immunologic: Positive for environmental allergies and food allergies.  Neurological: Positive for headaches. Negative for dizziness, tremors, seizures, syncope, facial asymmetry, speech difficulty, weakness, light-headedness and numbness.   Hematological: Negative for adenopathy. Does not bruise/bleed easily.  Psychiatric/Behavioral: Positive for sleep disturbance. Negative for agitation and confusion.       Objective:   Physical Exam Vitals and nursing note reviewed.  Constitutional:      General: She is awake. She is not in acute distress.    Appearance: Normal appearance. She is well-developed, well-groomed and normal weight. She is ill-appearing. She is not toxic-appearing or diaphoretic.  HENT:     Head: Normocephalic and atraumatic.     Jaw: There is normal jaw occlusion. No trismus.     Salivary Glands: Right salivary gland is not diffusely enlarged or tender. Left salivary gland is not diffusely enlarged.     Right Ear: Hearing, ear canal and external ear normal. A middle ear effusion is present.     Left Ear: Hearing, ear canal and external ear normal. A middle ear effusion is present.     Nose: Mucosal edema, congestion and rhinorrhea present. No nasal deformity, septal deviation, signs of injury, laceration or nasal tenderness.     Right Turbinates: Enlarged and swollen. Not pale.     Left Turbinates: Enlarged and swollen. Not pale.     Right Sinus: Maxillary sinus tenderness present. No frontal sinus tenderness.     Left Sinus: Maxillary sinus tenderness present. No frontal sinus tenderness.     Mouth/Throat:     Lips: Pink. No lesions.     Mouth: Mucous membranes are moist. Mucous membranes are not pale, not dry and not cyanotic. No injury, lacerations, oral lesions or angioedema.     Dentition: No dental caries, dental abscesses or gum lesions.     Tongue: No lesions. Tongue does not deviate from midline.  Palate: No mass and lesions.     Pharynx: Uvula midline. Pharyngeal swelling and posterior oropharyngeal erythema present. No oropharyngeal exudate or uvula swelling.     Tonsils: No tonsillar exudate or tonsillar abscesses.     Comments: Cobblestoning posterior pharynx; bilateral TMs air fluid level  clear; bilateral allergic shiners; bilateral nasal turbinates edema/erythema; maxillary sinuses mildly ttp bilaterally Eyes:     General: Lids are normal. Vision grossly intact. Gaze aligned appropriately. Allergic shiner present. No visual field deficit or scleral icterus.       Right eye: No foreign body, discharge or hordeolum.        Left eye: No foreign body, discharge or hordeolum.     Extraocular Movements: Extraocular movements intact.     Right eye: Normal extraocular motion and no nystagmus.     Left eye: Normal extraocular motion and no nystagmus.     Conjunctiva/sclera: Conjunctivae normal.     Right eye: Right conjunctiva is not injected. No chemosis, exudate or hemorrhage.    Left eye: Left conjunctiva is not injected. No chemosis, exudate or hemorrhage.    Pupils: Pupils are equal, round, and reactive to light. Pupils are equal.     Right eye: Pupil is round and reactive.     Left eye: Pupil is round and reactive.  Neck:     Thyroid: No thyroid mass, thyromegaly or thyroid tenderness.     Trachea: Trachea and phonation normal. No tracheal tenderness or tracheal deviation.  Cardiovascular:     Rate and Rhythm: Normal rate and regular rhythm.     Chest Wall: PMI is not displaced.     Pulses: Normal pulses.          Radial pulses are 2+ on the right side and 2+ on the left side.     Heart sounds: Normal heart sounds, S1 normal and S2 normal. No murmur heard. No friction rub. No gallop.   Pulmonary:     Effort: Pulmonary effort is normal. No accessory muscle usage or respiratory distress.     Breath sounds: Normal breath sounds and air entry. No stridor, decreased air movement or transmitted upper airway sounds. No decreased breath sounds, wheezing, rhonchi or rales.     Comments: Almost continuous dry mild cough in exam room; wearing cloth mask due to covid 19 pandemic; spoke full sentences sometimes interrupted by coughing Chest:     Chest wall: No tenderness.  Abdominal:      General: Abdomen is flat. There is no distension.     Palpations: Abdomen is soft.  Musculoskeletal:        General: No swelling, tenderness, deformity or signs of injury. Normal range of motion.     Right shoulder: Normal. No swelling, deformity, effusion or laceration. Normal range of motion.     Left shoulder: Normal. No swelling, deformity, effusion or laceration. Normal range of motion.     Right elbow: No swelling, deformity, effusion or lacerations. Normal range of motion.     Left elbow: No swelling, deformity, effusion or lacerations. Normal range of motion.     Right hand: Normal. No swelling, deformity or lacerations. Normal range of motion.     Left hand: Normal. No swelling, deformity or lacerations. Normal range of motion.     Cervical back: Normal range of motion and neck supple. No swelling, edema, deformity, erythema, signs of trauma, lacerations, rigidity, spasms, torticollis, tenderness or crepitus. No pain with movement, spinous process tenderness or muscular tenderness. Normal range of  motion.     Thoracic back: No swelling, edema, deformity, signs of trauma or lacerations. Normal range of motion.     Lumbar back: No swelling, edema, deformity, signs of trauma or lacerations.     Right hip: Normal. No deformity, lacerations or crepitus.     Left hip: Normal. No deformity, lacerations or crepitus.     Right knee: Normal.     Left knee: Normal.     Right lower leg: No edema.     Left lower leg: No edema.  Lymphadenopathy:     Head:     Right side of head: No submental, submandibular, tonsillar, preauricular, posterior auricular or occipital adenopathy.     Left side of head: No submental, submandibular, tonsillar, preauricular, posterior auricular or occipital adenopathy.     Cervical: No cervical adenopathy.     Right cervical: No superficial, deep or posterior cervical adenopathy.    Left cervical: No superficial, deep or posterior cervical adenopathy.  Skin:     General: Skin is warm and dry.     Capillary Refill: Capillary refill takes less than 2 seconds.     Coloration: Skin is not ashen, cyanotic, jaundiced, mottled, pale or sallow.     Findings: No abrasion, abscess, acne, bruising, burn, ecchymosis, erythema, signs of injury, laceration, lesion, petechiae, rash or wound.     Nails: There is no clubbing.  Neurological:     General: No focal deficit present.     Mental Status: She is alert and oriented to person, place, and time. Mental status is at baseline. She is not disoriented.     GCS: GCS eye subscore is 4. GCS verbal subscore is 5. GCS motor subscore is 6.     Cranial Nerves: Cranial nerves are intact. No cranial nerve deficit, dysarthria or facial asymmetry.     Sensory: Sensation is intact. No sensory deficit.     Motor: Motor function is intact. No weakness, tremor, atrophy, abnormal muscle tone or seizure activity.     Coordination: Coordination is intact. Coordination normal.     Gait: Gait is intact. Gait normal.     Comments: Gait sure and steady in clinic; in/out of chair and on/off exam table without difficulty; bilateral hand grasp equal 5/5  Psychiatric:        Attention and Perception: Attention and perception normal.        Mood and Affect: Mood and affect normal.        Speech: Speech normal.        Behavior: Behavior normal. Behavior is cooperative.        Thought Content: Thought content normal.        Cognition and Memory: Cognition and memory normal.        Judgment: Judgment normal.           Assessment & Plan:  A-acute bronchitis, seasonal allergic rhinitis, acute maxillary sinusitis, eustachian tube dysfunction  P- Home covid test today.  covid vaccine x2.  Will call to follow up test results after work today.  Wear mask when at work or around others.  Discussed sometimes GERD can cause cough.  Noted post nasal drip most likely cause of this cough/bronchial irritation.  May continue topical or oral cough  drops with menthol.  Rx electronic tessalon pearles 200mg  po TID prn cough #30 RF0 swallow whole do not chew and nondrowsy.  Cough lozenges po q2h prn cough OTC.  Prednisone taper 10mg  (60/50/40/30/20/10mg ) po daily with breakfast #21 RF0 dispensed  from [facility].  Discussed possible side effects increased/decreased appetite, difficulty sleeping, increased blood sugar, increased blood pressure and heart rate.  Albuterol MDI 25mcg 1-2 puffs po q4-6h prn protracted cough/wheeze side effect increased heart rate/hand tremor. Bronchitis simple, community acquired, may have started as viral (probably respiratory syncytial, parainfluenza, influenza, or adenovirus), but now evidence of acute purulent bronchitis with resultant bronchial edema and mucus formation.  Viruses are the most common cause of bronchial inflammation in otherwise healthy adults with acute bronchitis.  The appearance of sputum is not predictive of whether a bacterial infection is present.  Purulent sputum is most often caused by viral infections.  There are a small portion of those caused by non-viral agents being Mycoplama pneumonia.  Microscopic examination or C&S of sputum in the healthy adult with acute bronchitis is generally not helpful (usually negative or normal respiratory flora) other considerations being cough from upper respiratory tract infections, sinusitis or allergic syndromes (mild asthma or viral pneumonia).  Differential Diagnoses:  reactive airway disease (asthma, allergic aspergillosis (eosinophilia), chronic bronchitis, respiratory infection (sinusitis, common cold, pneumonia), congestive heart failure, reflux esophagitis, bronchogenic tumor, aspiration syndromes and/or exposure to pulmonary irritants/smoke. Without high fever, severe dyspnea, lack of physical findings or other risk factors, I will hold on a chest radiograph and CBC at this time.  I discussed that approximately 50% of patients with acute bronchitis have a cough that  lasts up to three weeks, and 25% for over a month.  Tylenol 500mg  one to two tablets every four to six hours as needed for fever or myalgias.  No aspirin. Exitcare handout on bronchitis and inhaler use given to patient.  ER if hemopthysis, SOB, worst chest pain of life.   Patient instructed to follow up if symptoms worsen.  Patient verbalized agreement and understanding of treatment plan.  P2:  hand washing and cover cough  Continue zyrtec, nasal saline and consider adding flonase. Prednisone should help to decrease sinus pain and pressure also.  Discussed with patient antibiotics do not help allergic/viral sinusitis/rhinitis.   Shower after work or being outside prior to bedtime.  Patient may use normal saline nasal spray 2 sprays each nostril q2h wa as needed. flonase 15mcg 1 spray each nostril BID OTC.  Patient denied personal or family history of ENT cancer.  OTC antihistamine of choice claritin/zyrtec 10mg  po daily.  Avoid triggers if possible.  Shower prior to bedtime if exposed to triggers.  If allergic dust/dust mites recommend mattress/pillow covers/encasements; washing linens, vacuuming, sweeping, dusting weekly.  Call or return to clinic as needed if these symptoms worsen or fail to improve as anticipated.  Patient verbalized understanding of instructions, agreed with plan of care and had no further questions at this time.  P2:  Avoidance and hand washing.   No evidence of invasive bacterial infection, non toxic and well hydrated.  I do not see where any further testing or imaging is necessary at this time.   I will suggest supportive care, rest, good hygiene and encourage the patient to take adequate fluids.  The patient is to return to clinic or EMERGENCY ROOM if symptoms worsen or change significantly e.g. ear pain, fever, purulent discharge from ears or bleeding. Discussed with patient post nasal drip irritates throat/causes swelling blocks eustachian tubes from draining and fluid fills up middle  ear.  Bacteria/viruses can grow in fluid and with moving head tube compressed and increases pressure in tube/ear worsening pain.  Studies show will take 30 days for fluid to resolve after  post nasal drip controlled with nasal steroid/antihistamine. Antibiotics and steroids do not speed up fluid removal.  Patient verbalized agreement and understanding of treatment plan and had no further questions at this time.

## 2020-11-05 ENCOUNTER — Encounter: Payer: Self-pay | Admitting: Registered Nurse

## 2020-11-05 NOTE — Telephone Encounter (Signed)
Patient contacted via telephone.  She reported home covid test negative and cough resolved with prednisone taper.  She did not pick up her tessalon pearles rx from pharmacy yesterday.  She is feeling much better today and did not have any questions or concerns.  No cough audible during 2 minute telephone call.  A&Ox3 spoke full sentences without difficulty.  Patient to pick up rx if cough returns and notify clinic staff if tessalon pearles do not help.  Patient verbalized understanding information/instructions, agreed with plan of care and had no further questions at this time.  HR notified covid test negative and may be onsite with mask wear if coughing.

## 2021-04-23 ENCOUNTER — Telehealth: Payer: Self-pay | Admitting: Registered Nurse

## 2021-04-23 ENCOUNTER — Encounter: Payer: Self-pay | Admitting: Registered Nurse

## 2021-04-23 ENCOUNTER — Other Ambulatory Visit: Payer: Self-pay

## 2021-04-23 ENCOUNTER — Ambulatory Visit: Payer: Self-pay | Admitting: *Deleted

## 2021-04-23 DIAGNOSIS — E785 Hyperlipidemia, unspecified: Secondary | ICD-10-CM

## 2021-04-23 DIAGNOSIS — E559 Vitamin D deficiency, unspecified: Secondary | ICD-10-CM

## 2021-04-23 NOTE — Telephone Encounter (Signed)
Patient stopped NP in warehouse to let me know she had Vitamin D blood sample drawn by RN Hildred Alamin this am.  She has not been taking any Vitamin D Supplement only sun on skin daily except the last few days due to cold front (freezing at night). She was wondering if we could check her cholesterol today also.  Discussed typically that requires another tube of blood.  Patient denied taking meds for cholesterol and stated she has been on same diet for over a year.  Last labs Apr 2022.  Discussed with patient will check cholesterol with next [facility] labs in 2023.  Patient verbalized understanding information/instructions, agreed with plan of care and had no further questions at this time.  Results for VALECIA, BESKE (MRN [id number]) as of 04/23/2021 12:04  Ref. Range 10/20/2020 09:29  Sodium Latest Ref Range: 134 - 144 mmol/L 140  Potassium Latest Ref Range: 3.5 - 5.2 mmol/L 4.1  Chloride Latest Ref Range: 96 - 106 mmol/L 101  Glucose Latest Ref Range: 65 - 99 mg/dL 80  BUN Latest Ref Range: 6 - 24 mg/dL 14  Creatinine Latest Ref Range: 0.57 - 1.00 mg/dL 0.88  Calcium Latest Ref Range: 8.7 - 10.2 mg/dL 9.4  BUN/Creatinine Ratio Latest Ref Range: 9 - 23  16  eGFR Latest Ref Range: >59 mL/min/1.73 78  Phosphorus Latest Ref Range: 3.0 - 4.3 mg/dL 3.8  Magnesium Latest Ref Range: 1.6 - 2.3 mg/dL 2.1  Alkaline Phosphatase Latest Ref Range: 44 - 121 IU/L 71  Albumin Latest Ref Range: 3.8 - 4.9 g/dL 4.8  Albumin/Globulin Ratio Latest Ref Range: 1.2 - 2.2  1.6  Uric Acid Latest Ref Range: 3.0 - 7.2 mg/dL 6.1  AST Latest Ref Range: 0 - 40 IU/L 17  ALT Latest Ref Range: 0 - 32 IU/L 18  Total Protein Latest Ref Range: 6.0 - 8.5 g/dL 7.8  Total Bilirubin Latest Ref Range: 0.0 - 1.2 mg/dL 0.5  GGT Latest Ref Range: 0 - 60 IU/L 20  Estimated CHD Risk Latest Ref Range: 0.0 - 1.0 times avg. 1.2 (H)  LDH Latest Ref Range: 119 - 226 IU/L 162  Total CHOL/HDL Ratio Latest Ref Range: 0.0 - 4.4 ratio 4.7 (H)   Cholesterol, Total Latest Ref Range: 100 - 199 mg/dL 230 (H)  HDL Cholesterol Latest Ref Range: >39 mg/dL 49  Triglycerides Latest Ref Range: 0 - 149 mg/dL 188 (H)  VLDL Cholesterol Cal Latest Ref Range: 5 - 40 mg/dL 34  LDL Chol Calc (NIH) Latest Ref Range: 0 - 99 mg/dL 147 (H)  Iron Latest Ref Range: 27 - 159 ug/dL 79  Vitamin D, 25-Hydroxy Latest Ref Range: 30.0 - 100.0 ng/mL 15.1 (L)  Globulin, Total Latest Ref Range: 1.5 - 4.5 g/dL 3.0  WBC Latest Ref Range: 3.4 - 10.8 x10E3/uL 3.9  RBC Latest Ref Range: 3.77 - 5.28 x10E6/uL 4.77  Hemoglobin Latest Ref Range: 11.1 - 15.9 g/dL 14.6  HCT Latest Ref Range: 34.0 - 46.6 % 43.2  MCV Latest Ref Range: 79 - 97 fL 91  MCH Latest Ref Range: 26.6 - 33.0 pg 30.6  MCHC Latest Ref Range: 31.5 - 35.7 g/dL 33.8  RDW Latest Ref Range: 11.7 - 15.4 % 12.1  Platelets Latest Ref Range: 150 - 450 x10E3/uL 283  Neutrophils Latest Ref Range: Not Estab. % 33  Immature Granulocytes Latest Ref Range: Not Estab. % 0  NEUT# Latest Ref Range: 1.4 - 7.0 x10E3/uL 1.3 (L)  Lymphocyte # Latest  Ref Range: 0.7 - 3.1 x10E3/uL 1.7  Monocytes Absolute Latest Ref Range: 0.1 - 0.9 x10E3/uL 0.3  Basophils Absolute Latest Ref Range: 0.0 - 0.2 x10E3/uL 0.1  Immature Grans (Abs) Latest Ref Range: 0.0 - 0.1 x10E3/uL 0.0  Lymphs Latest Ref Range: Not Estab. % 44  Monocytes Latest Ref Range: Not Estab. % 8  Basos Latest Ref Range: Not Estab. % 2  Eos Latest Ref Range: Not Estab. % 13  EOS (ABSOLUTE) Latest Ref Range: 0.0 - 0.4 x10E3/uL 0.5 (H)  Hemoglobin A1C Latest Ref Range: 4.8 - 5.6 % 5.3  TSH Latest Ref Range: 0.450 - 4.500 uIU/mL 1.400  Thyroxine (T4) Latest Ref Range: 4.5 - 12.0 ug/dL 7.0  Free Thyroxine Index Latest Ref Range: 1.2 - 4.9  1.9  T3 Uptake Ratio Latest Ref Range: 24 - 39 % 27

## 2021-04-23 NOTE — Progress Notes (Signed)
Nonfasting labs per NP Tina's orders.

## 2021-04-24 LAB — VITAMIN D 25 HYDROXY (VIT D DEFICIENCY, FRACTURES): Vit D, 25-Hydroxy: 29.1 ng/mL — ABNORMAL LOW (ref 30.0–100.0)

## 2021-04-27 ENCOUNTER — Other Ambulatory Visit: Payer: Self-pay | Admitting: Family Medicine

## 2021-04-27 DIAGNOSIS — E559 Vitamin D deficiency, unspecified: Secondary | ICD-10-CM

## 2021-04-27 DIAGNOSIS — E785 Hyperlipidemia, unspecified: Secondary | ICD-10-CM

## 2021-04-27 NOTE — Addendum Note (Signed)
Addended by: Ria Bush on: 04/27/2021 07:54 PM   Modules accepted: Orders

## 2021-04-30 ENCOUNTER — Other Ambulatory Visit: Payer: PRIVATE HEALTH INSURANCE

## 2021-04-30 NOTE — Addendum Note (Signed)
Addended by: Gerarda Fraction A on: 04/30/2021 12:54 PM   Modules accepted: Orders

## 2021-05-01 ENCOUNTER — Ambulatory Visit: Payer: Self-pay | Admitting: *Deleted

## 2021-05-01 ENCOUNTER — Other Ambulatory Visit: Payer: Self-pay

## 2021-05-01 DIAGNOSIS — E785 Hyperlipidemia, unspecified: Secondary | ICD-10-CM

## 2021-05-01 DIAGNOSIS — E559 Vitamin D deficiency, unspecified: Secondary | ICD-10-CM

## 2021-05-01 NOTE — Addendum Note (Signed)
Addended by: Beckie Busing on: 05/01/2021 09:15 AM   Modules accepted: Orders

## 2021-05-02 ENCOUNTER — Encounter: Payer: Self-pay | Admitting: Registered Nurse

## 2021-05-02 LAB — SPECIMEN STATUS

## 2021-05-04 LAB — LIPID PANEL
Chol/HDL Ratio: 5.7 ratio — ABNORMAL HIGH (ref 0.0–4.4)
Cholesterol, Total: 270 mg/dL — ABNORMAL HIGH (ref 100–199)
HDL: 47 mg/dL (ref >39)
LDL Chol Calc (NIH): 181 mg/dL — ABNORMAL HIGH (ref 0–99)
Triglycerides: 220 mg/dL — ABNORMAL HIGH (ref 0–149)
VLDL Cholesterol Cal: 42 mg/dL — ABNORMAL HIGH (ref 5–40)

## 2021-05-04 LAB — COMPREHENSIVE METABOLIC PANEL
ALT: 47 IU/L — ABNORMAL HIGH (ref 0–32)
AST: 35 IU/L (ref 0–40)
Albumin/Globulin Ratio: 1.6 (ref 1.2–2.2)
Albumin: 4.7 g/dL (ref 3.8–4.9)
Alkaline Phosphatase: 85 IU/L (ref 44–121)
BUN/Creatinine Ratio: 16 (ref 9–23)
BUN: 14 mg/dL (ref 6–24)
Bilirubin Total: 0.6 mg/dL (ref 0.0–1.2)
CO2: 23 mmol/L (ref 20–29)
Calcium: 9.6 mg/dL (ref 8.7–10.2)
Chloride: 103 mmol/L (ref 96–106)
Creatinine, Ser: 0.87 mg/dL (ref 0.57–1.00)
Globulin, Total: 2.9 g/dL (ref 1.5–4.5)
Glucose: 89 mg/dL (ref 70–99)
Potassium: 4.3 mmol/L (ref 3.5–5.2)
Sodium: 141 mmol/L (ref 134–144)
Total Protein: 7.6 g/dL (ref 6.0–8.5)
eGFR: 79 mL/min/{1.73_m2} (ref >59)

## 2021-05-04 LAB — VITAMIN D 25 HYDROXY (VIT D DEFICIENCY, FRACTURES): Vit D, 25-Hydroxy: 29.8 ng/mL — ABNORMAL LOW (ref 30.0–100.0)

## 2021-05-04 NOTE — Progress Notes (Signed)
Noted CBC tube was extra drawn in case PCM add ons

## 2021-05-04 NOTE — Progress Notes (Signed)
CBC will not be resulting as it was an extra tube. Will be held for 7 days from draw date in case of any add-ons.  Results and NP notes reviewed by pt 10/29 per [facility].

## 2021-05-06 ENCOUNTER — Encounter: Payer: Self-pay | Admitting: Family Medicine

## 2021-05-06 ENCOUNTER — Other Ambulatory Visit: Payer: Self-pay

## 2021-05-06 ENCOUNTER — Ambulatory Visit (INDEPENDENT_AMBULATORY_CARE_PROVIDER_SITE_OTHER): Payer: No Typology Code available for payment source | Admitting: Family Medicine

## 2021-05-06 VITALS — BP 118/78 | HR 72 | Temp 97.8°F | Ht 61.5 in | Wt 150.1 lb

## 2021-05-06 DIAGNOSIS — Z0001 Encounter for general adult medical examination with abnormal findings: Secondary | ICD-10-CM

## 2021-05-06 DIAGNOSIS — R519 Headache, unspecified: Secondary | ICD-10-CM | POA: Diagnosis not present

## 2021-05-06 DIAGNOSIS — H539 Unspecified visual disturbance: Secondary | ICD-10-CM

## 2021-05-06 DIAGNOSIS — Z803 Family history of malignant neoplasm of breast: Secondary | ICD-10-CM

## 2021-05-06 DIAGNOSIS — Z0000 Encounter for general adult medical examination without abnormal findings: Secondary | ICD-10-CM

## 2021-05-06 DIAGNOSIS — E559 Vitamin D deficiency, unspecified: Secondary | ICD-10-CM

## 2021-05-06 DIAGNOSIS — R202 Paresthesia of skin: Secondary | ICD-10-CM | POA: Diagnosis not present

## 2021-05-06 DIAGNOSIS — K2100 Gastro-esophageal reflux disease with esophagitis, without bleeding: Secondary | ICD-10-CM

## 2021-05-06 DIAGNOSIS — J301 Allergic rhinitis due to pollen: Secondary | ICD-10-CM

## 2021-05-06 DIAGNOSIS — E785 Hyperlipidemia, unspecified: Secondary | ICD-10-CM

## 2021-05-06 MED ORDER — VITAMIN D 50 MCG (2000 UT) PO CAPS: 1.0000 | ORAL_CAPSULE | Freq: Every day | ORAL | Status: DC

## 2021-05-06 MED ORDER — ATORVASTATIN CALCIUM 40 MG PO TABS: 40.0000 mg | ORAL_TABLET | Freq: Every day | ORAL | 11 refills | Status: DC

## 2021-05-06 MED ORDER — ASPIRIN 81 MG PO TBEC: 81.0000 mg | DELAYED_RELEASE_TABLET | Freq: Every day | ORAL | Status: DC

## 2021-05-06 NOTE — Assessment & Plan Note (Signed)
Preventative protocols reviewed and updated unless pt declined. Discussed healthy diet and lifestyle.  

## 2021-05-06 NOTE — Patient Instructions (Addendum)
Considere vacuna contra tetano/tos ferina, y contra shingles.  [place] cantidad de [place] y legumbres en la dieta para mejorar niveles de colesterol. Recomiendo repetir laboratorios en 6 meses.  Para cambios de vision, la mandare a sonograma de arterias de cuello, a ofthalmologo, comienze aspirina 81mg  diarios y atorvastatina 40mg  diarios.  Laboratorios hoy.  Regresar en 6 meses para revisar colesterol.   [place] Maintenance for Postmenopausal Women La menopausia es un proceso normal en el cual la capacidad de quedar embarazada llega a su fin. Este proceso ocurre lentamente a lo largo de un perodo de muchos meses o aos; por lo general, entre los 37 y los 85 aos. La menopausia es completa cuando no se ha tenido el perodo menstrual por 12 meses. Es importante hablar con el mdico sobre algunas de las enfermedades ms comunes que afectan a las mujeres despus de la menopausia (mujeres posmenopusicas). Estas incluyen la enfermedad cardaca, el cncer y la prdida sea (osteoporosis). Adoptar un estilo de vida saludable y recibir atencin preventiva pueden ayudar a promover la salud y Musician. Las medidas que tome tambin pueden reducir las probabilidades de Actor algunas de estas enfermedades frecuentes. Qu debo saber acerca de la menopausia? Durante la menopausia, puede tener una serie de sntomas, por ejemplo: Acaloramiento. Estos pueden ser moderados o intensos. Sudoracin nocturna. Disminucin del deseo sexual. Cambios en el estado de nimo. Dolores de [place]. Cansancio. Irritabilidad. Problemas de memoria. Insomnio. Tratar o no estos sntomas es una decisin que se toma con el mdico. Necesito terapia de reemplazo hormonal? La terapia de reemplazo hormonal es eficaz para tratar los sntomas causados por la menopausia, como los acaloramientos y las sudoraciones nocturnas. La reposicin hormonal conlleva ciertos riesgos,  especialmente a medida que una mujer envejece. Si est pensando en usar estrgeno o estrgeno con progestina, analice los beneficios y los riesgos con el mdico. Cul es mi riesgo de sufrir enfermedad cardaca y accidente cerebrovascular? A medida que se envejece, aumenta el riesgo de enfermedad cardaca, infarto de miocardio y accidente cerebrovascular. Una de las causas puede ser un cambio en las hormonas del cuerpo durante la menopausia. Esto puede afectar la forma en que el organismo procesa las [place], los triglicridos y el colesterol de su dieta. El infarto de miocardio y el accidente cerebrovascular son emergencias mdicas. Hay muchas cosas que se pueden hacer para ayudar a prevenir la enfermedad cardaca y el accidente cerebrovascular. Contrlese la presin arterial La hipertensin arterial causa enfermedades cardacas y [place] el riesgo de accidente cerebrovascular. Es ms probable que esto se manifieste en las personas que tienen lecturas de presin arterial alta, tienen ascendencia africana o tienen sobrepeso. Hgase controlar la presin arterial: Cada 3 a 5 aos si tiene entre 18 y 11 aos. Todos los aos si es mayor de 40 aos. Consuma una dieta saludable  Consuma una dieta que incluya muchas verduras, frutas, productos lcteos con bajo contenido de [place] y Advertising account planner. No consuma muchos alimentos ricos en grasas slidas, azcares agregados o sodio. Haga ejercicio con regularidad Haga ejercicio con regularidad. Esta es una de las prcticas ms importantes que puede hacer por su salud. La mayora de los adultos deben seguir estas pautas: Intente realizar al menos 150 minutos de actividad fsica por semana. El ejercicio debe aumentar la frecuencia cardaca y Nature conservation officer transpirar (ejercicio de intensidad moderada). Intente hacer ejercicios de elongacin por lo menos dos veces por semana. Agrguelos al plan de ejercicio de intensidad moderada. Pasar menos tiempo  sentados. Incluso la  actividad fsica ligera puede ser beneficiosa. Otros consejos Trabaje con su mdico para Science writer o Theatre manager un peso saludable. No consuma ningn producto que contenga nicotina o tabaco, como cigarrillos, cigarrillos electrnicos y tabaco de Higher education careers adviser. Si necesita ayuda para dejar de fumar, consulte al mdico. Conozca sus cifras. Pdale al mdico que le controle el colesterol y el nivel sanguneo de azcar en la sangre (glucosa). Siga hacindose anlisis de [employer] se lo haya indicado el mdico. Necesito realizarme pruebas de deteccin del cncer? Segn su historia clnica y sus antecedentes familiares, es posible que deba realizarse pruebas de deteccin del cncer en diferentes etapas de la vida. Esto puede incluir pruebas de deteccin de lo siguiente: Cncer de mama. Cncer de cuello uterino. Cncer de pulmn. Cncer colorrectal. Cul es mi riesgo de tener osteoporosis? Despus de la menopausia, puede correr un riesgo ms alto de tener osteoporosis. La osteoporosis es una afeccin en la cual la destruccin de la masa sea ocurre con mayor rapidez que su formacin. Para ayudar a prevenir esta afeccin o las fracturas seas que pueden ocurrir a causa de [place], usted puede tomar las siguientes medidas: Si tiene entre 19 y 71 aos, tome como mnimo 1000 mg de calcio y 600 mg de vitamina D por Training and development officer. Si es mayor de 50 aos pero menor de 70 aos, tome como mnimo 1200 mg de calcio y 600 mg de vitamina D por da. Si es mayor de 70 aos, tome como mnimo 1200 mg de calcio y 800 mg de vitamina D por da. Fumar y beber alcohol en exceso aumentan el riesgo de osteoporosis. Consuma alimentos ricos en calcio y vitamina D, y haga ejercicios con soporte de peso varias veces a la [place], como se lo haya indicado el mdico. De qu manera la menopausia afecta mi salud mental? La depresin puede presentarse a cualquier edad, pero es ms frecuente a medida que una persona envejece. Los sntomas comunes de depresin  incluyen lo siguiente: Desnimo o tristeza. Cambios en los patrones de sueo. Cambios en el apetito o en los hbitos de alimentacin. Sensacin de falta general de motivacin o placer al [employer] actividades que sola disfrutar. Crisis frecuentes de llanto. Hable con el mdico si cree que tiene depresin. Instrucciones generales Visite a su mdico para hacerse exmenes de bienestar peridicos y aplicarse vacunas. Puede incluir: Programar exmenes peridicos dentales, de la salud y de Public librarian. Recibir y [employer]. Estos incluyen los siguientes: Human resources officer. Aplquese esta vacuna todos los aos antes de que comience la temporada de gripe. Vacuna contra la neumona. Vacuna contra el herpes. Vacuna contra el ttanos, la difteria y la tos Dyann Ruddle (Tdap). El mdico tambin puede recomendarle que se aplique otras vacunas. Notifique a su mdico si alguna vez ha sido vctima de abuso o si no se siente seguro en su hogar. Resumen La menopausia es un proceso normal en el cual la capacidad de quedar embarazada llega a su fin. Esta condicin causa acaloramientos, sudoraciones nocturnas, disminucin del inters en el sexo, cambios en el estado de nimo, dolores de [place] o falta de sueo. El tratamiento de esta afeccin puede incluir una terapia de reemplazo hormonal. Tome medidas para mantenerse 81, entre ellas, hacer ejercicio con regularidad, seguir una dieta saludable, controlar su peso y medirse la presin arterial y los niveles de Dispensing optician. Hgase pruebas para Film/video editor y depresin. Asegrese de estar al da con todas las vacunas. Esta informacin no tiene  como fin reemplazar el consejo del mdico. Asegrese de hacerle al mdico cualquier pregunta que tenga. Document Revised: 07/12/2018 Document Reviewed: 07/12/2018 Elsevier Patient Education  [place].

## 2021-05-06 NOTE — Progress Notes (Signed)
Patient ID: Isabella Robertson, female    DOB: April 29, 1966, 55 y.o.   MRN: [id number]  This visit was conducted in person.  BP 118/78   Pulse 72   Temp 97.8 F (36.6 C) (Temporal)   Ht 5' 1.5" (1.562 m)   Wt 150 lb 2 oz (68.1 kg)   SpO2 97%   BMI 27.91 kg/m    CC: CPE Subjective:   HPI: Isabella Robertson is a 55 y.o. female presenting on 05/06/2021 for Annual Exam   Chronic abd pain - saw [facility] ([facility]) s/p EGD, colonoscopy. EGD showed chronic gastritis and reflux esophagitis - omeprazole increased to BID dosing. Also found to have SIBO treated with xifaxan 2 wk course. After colonoscopy she had 2 wk course of doxycycline. Started courese of PPI BID. This year has been doing significantly better re GI - using omeprazole only PRN (a few times a week).   Allergies - takes singulair alternating with zyrtec.   By the way - episode 2 wks ago of vision disturbance described as difficulty seeing left side of visual field - trouble reading (could only see 2 zeros when reading number 500) and trouble seeing coworkers when eating (2 in front of her, only saw one on the right). This lasted 20 min, improved with rest and water intake. No full left visual field loss, no blurry or cloudy vision. Also has had ongoing L sided temporal headache for the past ~6 months.   Preventative: Colonoscopy 04/2020 - hemorrhoids, diverticulosis, repeat 10 yrs ([facility]) EGD 04/2020 - normal esophagus, duodenum, gastric mucosal atrophy s/p biopsies showing chronic gastritis and reflux esophagitis ([facility])  Breast cancer screening - mammogram Birads1 @ [facility] in [place] 04/2020 - gets this done at work, pending  Well woman exam - s/p hysterectomy 2010 for heavy bleeding ?fibroids, ovaries remain.  DEXA scan - not due yet Lung cancer screening - not eligible Flu shot - declines COVID vaccine Pfizer 09/2019, 10/2019, booster 06/2020  Tetanus - declines, thinks last done 2003  Pneumonia shot - not  due Shingrix - discussed, declines. Has not had chicken pox.  Seat belt use discussed  Sunscreen use discussed. No changing moles on skin. Non smoker Alcohol - none Dentist - yearly Eye exam - yearly - due, not scheduled until 07/2021  Lives with husband and daughter  Occ: Replacement limited since 2006  Activity: walks regularly at work  Diet: good water intake, fruits/vegetables daily      Relevant past medical, surgical, family and social history reviewed and updated as indicated. Interim medical history since our last visit reviewed. Allergies and medications reviewed and updated. Outpatient Medications Prior to Visit  Medication Sig Dispense Refill   albuterol (VENTOLIN HFA) 108 (90 Base) MCG/ACT inhaler Inhale 1-2 puffs into the lungs every 4 (four) hours as needed for wheezing or shortness of breath. 6.7 g 1   Cetirizine HCl (ZYRTEC ALLERGY) 10 MG CAPS Take 10 mg by mouth daily.     dicyclomine (BENTYL) 10 MG capsule Take 10 mg by mouth 2 (two) times daily.     montelukast (SINGULAIR) 10 MG tablet Take 1 tablet (10 mg total) by mouth daily as needed.     omeprazole (PRILOSEC) 40 MG capsule Take 1 capsule (40 mg total) by mouth 2 (two) times daily. 180 capsule 3   No facility-administered medications prior to visit.     Per HPI unless specifically indicated in ROS section below Review of Systems  Constitutional:  Negative for activity change, appetite change, chills, fatigue, fever and unexpected weight change.  HENT:  Negative for hearing loss.   Eyes:  Positive for visual disturbance (episode 2 wks ago of left sided vision loss that lasted 20 min).  Respiratory:  Negative for cough, chest tightness, shortness of breath and wheezing.   Cardiovascular:  Negative for chest pain, palpitations and leg swelling.  Gastrointestinal:  Negative for abdominal distention, abdominal pain, blood in stool, constipation, diarrhea, nausea and vomiting.  Genitourinary:  Negative for  difficulty urinating and hematuria.  Musculoskeletal:  Positive for arthralgias (hand pain without erythema, warmth, swelling). Negative for myalgias and neck pain.       Left medial lower leg burning discomfort  Skin:  Negative for rash.  Neurological:  Positive for headaches (L temporal HA x 6+ months). Negative for dizziness, seizures and syncope.  Hematological:  Negative for adenopathy. Does not bruise/bleed easily.  Psychiatric/Behavioral:  Negative for dysphoric mood. The patient is not nervous/anxious.    Objective:  BP 118/78   Pulse 72   Temp 97.8 F (36.6 C) (Temporal)   Ht 5' 1.5" (1.562 m)   Wt 150 lb 2 oz (68.1 kg)   SpO2 97%   BMI 27.91 kg/m   Wt Readings from Last 3 Encounters:  05/06/21 150 lb 2 oz (68.1 kg)  10/20/20 145 lb (65.8 kg)  10/03/20 149 lb (67.6 kg)      Physical Exam Vitals and nursing note reviewed.  Constitutional:      Appearance: Normal appearance. She is not ill-appearing.  HENT:     Head: Normocephalic and atraumatic.     Right Ear: Tympanic membrane, ear canal and external ear normal. There is no impacted cerumen.     Left Ear: Tympanic membrane, ear canal and external ear normal. There is no impacted cerumen.  Eyes:     General:        Right eye: No discharge.        Left eye: No discharge.     Extraocular Movements: Extraocular movements intact.     Conjunctiva/sclera: Conjunctivae normal.     Pupils: Pupils are equal, round, and reactive to light.  Neck:     Thyroid: No thyroid mass or thyromegaly.  Cardiovascular:     Rate and Rhythm: Normal rate and regular rhythm.     Pulses: Normal pulses.     Heart sounds: Normal heart sounds. No murmur heard. Pulmonary:     Effort: Pulmonary effort is normal. No respiratory distress.     Breath sounds: Normal breath sounds. No wheezing, rhonchi or rales.  Abdominal:     General: Bowel sounds are normal. There is no distension.     Palpations: Abdomen is soft. There is no mass.      Tenderness: There is no abdominal tenderness. There is no guarding or rebound.     Hernia: No hernia is present.  Musculoskeletal:     Cervical back: Normal range of motion and neck supple. No rigidity.     Right lower leg: No edema.     Left lower leg: No edema.  Lymphadenopathy:     Cervical: No cervical adenopathy.  Skin:    General: Skin is warm and dry.     Findings: No rash.  Neurological:     General: No focal deficit present.     Mental Status: She is alert. Mental status is at baseline.     Cranial Nerves: Cranial nerves 2-12 are intact.  Sensory: Sensation is intact.     Motor: Motor function is intact.     Coordination: Coordination is intact.     Gait: Gait is intact.     Comments:  CN 2-12 intact FTN intact EOMI No pronator drift  Psychiatric:        Mood and Affect: Mood normal.        Behavior: Behavior normal.      Results for orders placed or performed in visit on 05/01/21  VITAMIN D 25 Hydroxy (Vit-D Deficiency, Fractures)  Result Value Ref Range   Vit D, 25-Hydroxy 29.8 (L) 30.0 - 100.0 ng/mL  Comprehensive metabolic panel  Result Value Ref Range   Glucose 89 70 - 99 mg/dL   BUN 14 6 - 24 mg/dL   Creatinine, Ser 0.87 0.57 - 1.00 mg/dL   eGFR 79 >59 mL/min/1.73   BUN/Creatinine Ratio 16 9 - 23   Sodium 141 134 - 144 mmol/L   Potassium 4.3 3.5 - 5.2 mmol/L   Chloride 103 96 - 106 mmol/L   CO2 23 20 - 29 mmol/L   Calcium 9.6 8.7 - 10.2 mg/dL   Total Protein 7.6 6.0 - 8.5 g/dL   Albumin 4.7 3.8 - 4.9 g/dL   Globulin, Total 2.9 1.5 - 4.5 g/dL   Albumin/Globulin Ratio 1.6 1.2 - 2.2   Bilirubin Total 0.6 0.0 - 1.2 mg/dL   Alkaline Phosphatase 85 44 - 121 IU/L   AST 35 0 - 40 IU/L   ALT 47 (H) 0 - 32 IU/L  Lipid panel  Result Value Ref Range   Cholesterol, Total 270 (H) 100 - 199 mg/dL   Triglycerides 220 (H) 0 - 149 mg/dL   HDL 47 >39 mg/dL   VLDL Cholesterol Cal 42 (H) 5 - 40 mg/dL   LDL Chol Calc (NIH) 181 (H) 0 - 99 mg/dL   Chol/HDL Ratio  5.7 (H) 0.0 - 4.4 ratio  Specimen Status  Result Value Ref Range   WBC WILL FOLLOW    RBC WILL FOLLOW    Hemoglobin WILL FOLLOW    Hematocrit WILL FOLLOW    MCV WILL FOLLOW    MCH WILL FOLLOW    MCHC WILL FOLLOW    RDW WILL FOLLOW    Platelets WILL FOLLOW    Neutrophils WILL FOLLOW    Lymphs WILL FOLLOW    Monocytes WILL FOLLOW    Eos WILL FOLLOW    Basos WILL FOLLOW    Neutrophils Absolute WILL FOLLOW    Lymphocytes Absolute WILL FOLLOW    Monocytes Absolute WILL FOLLOW    EOS (ABSOLUTE) WILL FOLLOW    Basophils Absolute WILL FOLLOW    Immature Granulocytes WILL FOLLOW    Immature Grans (Abs) WILL FOLLOW     Assessment & Plan:  This visit occurred during the SARS-CoV-2 public health emergency.  Safety protocols were in place, including screening questions prior to the visit, additional usage of staff PPE, and extensive cleaning of exam room while observing appropriate contact time as indicated for disinfecting solutions.   Problem List Items Addressed This Visit     Encounter for general adult medical examination with abnormal findings - Primary (Chronic)    Preventative protocols reviewed and updated unless pt declined. Discussed healthy diet and lifestyle.       Family history of malignant neoplasm of breast    Continue yearly mammograms.      Left temporal headache    With vision changes noted. Check ESR inflammatory  marker to evaluate for temporal arteritis.       Relevant Medications   aspirin 81 MG EC tablet   Other Relevant Orders   Sedimentation rate   TSH   Ambulatory referral to Ophthalmology   VAS US CAROTID   GERD (gastroesophageal reflux disease)    Stable period on PPI PRN.       Allergic rhinitis    Managing with PRN zyrtec/singulair.       HLD (hyperlipidemia)    Deterioration noted from last check 6 months ago. rec increased fiber and legumes in the diet.  With concerning vision changes I did recommend she start atorvastatin 15m daily -  sent to pharmacy.  The 10-year ASCVD risk score (Arnett DK, et al., 2019) is: 2.7%   Values used to calculate the score:     Age: [age]years     Sex: Female     Is Non-Hispanic African American: No     Diabetic: No     Tobacco smoker: No     Systolic Blood Pressure: 1765mmHg     Is BP treated: No     HDL Cholesterol: 47 mg/dL     Total Cholesterol: 270 mg/dL       Relevant Medications   aspirin 81 MG EC tablet   atorvastatin (LIPITOR) 40 MG tablet   Other Relevant Orders   VAS [place]OTID   Vitamin D deficiency    Has had 50k vit D weekly replacement course with improvement. Recommend she start daily maintenance supplementation with 2000 IU daily.       Vision changes    Concerning vision changes reported today with non-focal neurological exam. In HLD history, will check carotid [place] refer to ophthalmology for eye exam. Recommend start aspirin/statin in interim.       Relevant Orders   TSH   Ambulatory referral to Ophthalmology   VAS [place]OTID   Other Visit Diagnoses     Paresthesias       Relevant Orders   Vitamin B12        Meds ordered this encounter  Medications   Cholecalciferol (VITAMIN D) 50 MCG (2000 UT) CAPS    Sig: Take 1 capsule (2,000 Units total) by mouth daily.    Dispense:  30 capsule   aspirin 81 MG EC tablet    Sig: Take 1 tablet (81 mg total) by mouth daily. Swallow whole.   atorvastatin (LIPITOR) 40 MG tablet    Sig: Take 1 tablet (40 mg total) by mouth daily.    Dispense:  30 tablet    Refill:  11   Orders Placed This Encounter  Procedures   Sedimentation rate   Vitamin B12   TSH   Ambulatory referral to Ophthalmology    Referral Priority:   Routine    Referral Type:   Consultation    Referral Reason:   Specialty Services Required    Requested Specialty:   Ophthalmology    Number of Visits Requested:   1     Patient instructions: Considere vacuna contra tetano/tos fDyann Ruddle y contra shingles.  SColombiacantidad de f[place]ermuday legumbres en  la dieta para mejorar niveles de colesterol. Recomiendo repetir laboratorios en 6 meses.  Para cambios de vision, la mandare a sonograma de arterias de cuello, a ofthalmologo, comienze aspirina 87mdiarios y atorvastatina 4095miarios.  Laboratorios hoy.  Regresar en 6 meses para revisar colesterol  Follow up plan: Return in about 6 months (around 11/03/2021), or if  symptoms worsen or fail to improve, for follow up visit.  Ria Bush, MD

## 2021-05-07 ENCOUNTER — Encounter: Payer: Self-pay | Admitting: Family Medicine

## 2021-05-07 DIAGNOSIS — H539 Unspecified visual disturbance: Secondary | ICD-10-CM | POA: Insufficient documentation

## 2021-05-07 LAB — VITAMIN B12: Vitamin B-12: 510 pg/mL (ref 211–911)

## 2021-05-07 LAB — SEDIMENTATION RATE: Sed Rate: 12 mm/hr (ref 0–30)

## 2021-05-07 LAB — TSH: TSH: 1.38 u[IU]/mL (ref 0.35–5.50)

## 2021-05-07 NOTE — Assessment & Plan Note (Signed)
Managing with PRN zyrtec/singulair.

## 2021-05-07 NOTE — Assessment & Plan Note (Signed)
Concerning vision changes reported today with non-focal neurological exam. In HLD history, will check carotid US and refer to ophthalmology for eye exam. Recommend start aspirin/statin in interim.

## 2021-05-07 NOTE — Assessment & Plan Note (Addendum)
Deterioration noted from last check 6 months ago. rec increased fiber and legumes in the diet.  With concerning vision changes I did recommend she start atorvastatin 40mg  daily - sent to pharmacy.  The 10-year ASCVD risk score (Arnett DK, et al., 2019) is: 2.7%   Values used to calculate the score:     Age: 55 years     Sex: Female     Is Non-Hispanic African American: No     Diabetic: No     Tobacco smoker: No     Systolic Blood Pressure: 747 mmHg     Is BP treated: No     HDL Cholesterol: 47 mg/dL     Total Cholesterol: 270 mg/dL

## 2021-05-07 NOTE — Assessment & Plan Note (Signed)
Has had 50k vit D weekly replacement course with improvement. Recommend she start daily maintenance supplementation with 2000 IU daily.

## 2021-05-07 NOTE — Assessment & Plan Note (Addendum)
With vision changes noted. Check ESR inflammatory marker to evaluate for temporal arteritis.

## 2021-05-07 NOTE — Assessment & Plan Note (Signed)
Stable period on PPI PRN.

## 2021-05-07 NOTE — Assessment & Plan Note (Signed)
Continue yearly mammograms 

## 2021-06-08 ENCOUNTER — Encounter: Payer: Self-pay | Admitting: *Deleted

## 2021-06-08 ENCOUNTER — Telehealth: Payer: Self-pay | Admitting: *Deleted

## 2021-06-08 DIAGNOSIS — Z20822 Contact with and (suspected) exposure to covid-19: Secondary | ICD-10-CM

## 2021-06-08 DIAGNOSIS — U071 COVID-19: Secondary | ICD-10-CM

## 2021-06-08 MED ORDER — MOLNUPIRAVIR EUA 200MG CAPSULE: 4.0000 | ORAL_CAPSULE | Freq: Two times a day (BID) | ORAL | 0 refills | Status: AC

## 2021-06-08 NOTE — Telephone Encounter (Signed)
HR notified clinic last night that pt called out of work for today citing spouse with positive covid test and pt with sx and pending results.  Pt's sx started Saturday evening 12/3. Endorses fever Tmax 189f, chills, body aches, cough productive. Theraflu at home. Was tested at [facility] 12/4. They told her 2-3 days for results.   Day 0 12/3 Day 5 12/8 Possible RTW 12/9 depending on sx and test results.  Strict mask use thru 12/13.

## 2021-06-08 NOTE — Telephone Encounter (Signed)
Reviewed RN Hildred Alamin note agreed with plan of care.  Will follow up with patient via telephone 06/09/21 for re-evaluation.

## 2021-06-08 NOTE — Telephone Encounter (Signed)
Notified by HR Tim patient reported positive covid test results.  Patient reported today first day without fever.  She would like covid antivirals pharmacy preference [facility] [place] and then [facility].  Patient stated tolerating po intake without difficulty.  Some cough did not want prescription strength cough suppressant at this time theraflu helping her OTC.  Denied trouble breathing.  Body aches improved today also.  Continue quarantine as previously discussed.  Patient stated she is in her room apart from family and this is her first time with covid.  Exitcare handouts on covid isolation guidelines and caring for self at home. Patient A&Ox3 with congestion no audible cough/wheezing/throat clearing during 4 minute telephone call.   Patient verbalized understanding information/instructions, agreed with plan of care and had no further questions at this time.

## 2021-06-11 NOTE — Telephone Encounter (Signed)
Spoke with pt by phone. Cough, sore throat, dizziness with sudden movements, fatigue. Does not feel she can make it through whole shift tomorrow. Will remain out 12/9 and re-eval Sun 12/11 prior to possible RTW 12/12. Pt agreeable and denies needs or concerns ahead of weekend and confirms she has NP contact info if needed.

## 2021-06-11 NOTE — Telephone Encounter (Signed)
Reviewed RN Hildred Alamin note and will follow up with patient via telephone as worsening symptoms.

## 2021-06-12 NOTE — Telephone Encounter (Signed)
Patient contacted via telephone spoke full sentences without difficulty A&Ox3 no audible cough/throat clearing or nasal congestion during 5 minute call.  Patient stated throat feeling dry then triggers cough.  Honey 1 tablespoon every 4 hours helping.  Asked patient if she wanted refill on tessalon pearles and she stated not at this time.  Last prescribed May 2022 bronchitis.  Patient stated going to check at home if she has any remaining.  Discussed increase hydration as dizziness can be sign of dehydration.  Could also be fluid in middle ear consider trial meclizine.  Patient did not want Rx at this time.   Patient stated she is drinking a lot of water.  Discussed soup broth clear or tea with honey for throat also.  Patient stated yesterday she was very fatigued and in bed most of the day.  Going to try and move around more this weekend.  Discussed body is fighting virus and that uses energy.  Increased fluid loss with mucous/mouth breathing/cough.  Discussed I will contact her again this weekend to follow up or if she has concerns can contact me sooner.  Patient verbalized understanding information/instructions, agreed with plan of care and had no further questions at this time.

## 2021-06-15 NOTE — Telephone Encounter (Signed)
Reviewed RN Hildred Alamin note employee RTW as expected today still having some post nasal drip will follow up with patient later this week to ensure resolving.

## 2021-06-15 NOTE — Telephone Encounter (Signed)
Spoke with pt by phone. She reports feeling better, slight PND still at times.Did RTW today 12/12 as expected. Strict mask use thru 12/13. Closing encounter.

## 2021-06-18 ENCOUNTER — Other Ambulatory Visit: Payer: Self-pay

## 2021-06-18 ENCOUNTER — Ambulatory Visit (INDEPENDENT_AMBULATORY_CARE_PROVIDER_SITE_OTHER): Payer: No Typology Code available for payment source

## 2021-06-18 DIAGNOSIS — E785 Hyperlipidemia, unspecified: Secondary | ICD-10-CM

## 2021-06-18 DIAGNOSIS — H539 Unspecified visual disturbance: Secondary | ICD-10-CM | POA: Diagnosis not present

## 2021-06-18 DIAGNOSIS — R519 Headache, unspecified: Secondary | ICD-10-CM

## 2021-06-20 NOTE — Telephone Encounter (Signed)
Patient contacted via telephone reported worked all week.  Occasional pain in chest but otherwise feeling well.  Discussed with patient to come to clinic next week for re-evaluation (vitals/lung and heart sounds) if still having pain.  Patient denied cough/congestion/shortness of breath.  Patient A&Ox3 spoke full sentences without difficulty and no audible cough/wheeze/shortness of breath/throat clearing/nasal congesting during 2 minute telephone call. Patient verbalized understanding information/instructions, agreed with plan of care and had no further questions at this time.

## 2021-06-23 ENCOUNTER — Other Ambulatory Visit: Payer: Self-pay

## 2021-06-23 ENCOUNTER — Ambulatory Visit: Payer: Self-pay | Admitting: Registered Nurse

## 2021-06-23 ENCOUNTER — Encounter: Payer: Self-pay | Admitting: Registered Nurse

## 2021-06-23 VITALS — BP 106/85 | HR 88 | Temp 98.7°F

## 2021-06-23 DIAGNOSIS — R051 Acute cough: Secondary | ICD-10-CM

## 2021-06-23 NOTE — Progress Notes (Signed)
Subjective:    Patient ID: Isabella Robertson, female    DOB: [birth year], 55 y.o.   MRN: [id number]  55y/o [place] established female pt c/o chest pain very short duration/intermittent and shortness of breath that is intermittent for past few weeks since having Covid. Endorses PND and fatigue as well.  Cough dry nonproductive.  Nasal saline makes sinus congestion/pressure/nasal congestion worse.  Phenylephrine RN Hildred Alamin gave her from clinic stock helped but could not find at the store ran out.  Using honey and OTC medication from [place] herbal.  Helping with cough.  Father recently discharged from hospital with pneumonia.   She is here for evaluation to ensure her cough not pneumonia too.  Patient still wearing mask at work.  Covid day 0 06/06/21.  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  Has family coming to visit and trying to get her symptoms resolved prior to their arrival from out of state/[place].     Review of Systems  Constitutional:  Positive for fatigue. Negative for activity change, appetite change, chills, diaphoresis and fever.  HENT:  Positive for congestion, postnasal drip, rhinorrhea, sinus pressure and sinus pain. Negative for dental problem, drooling, ear discharge, ear pain, facial swelling, hearing loss, mouth sores, nosebleeds, sneezing, sore throat, tinnitus, trouble swallowing and voice change.   Eyes:  Negative for photophobia and visual disturbance.  Respiratory:  Positive for cough. Negative for shortness of breath, wheezing and stridor.   Cardiovascular:  Positive for chest pain.  Gastrointestinal:  Negative for diarrhea, nausea and vomiting.  Endocrine: Negative for cold intolerance and heat intolerance.  Genitourinary:  Negative for difficulty urinating.  Musculoskeletal:  Negative for back pain, gait problem, neck pain and neck stiffness.  Skin:  Negative for rash.  Allergic/Immunologic: Positive for environmental allergies and food allergies.   Neurological:  Negative for dizziness, tremors, seizures, syncope, facial asymmetry, speech difficulty, weakness, light-headedness, numbness and headaches.  Hematological:  Negative for adenopathy. Does not bruise/bleed easily.  Psychiatric/Behavioral:  Negative for agitation, confusion and sleep disturbance.       Objective:   Physical Exam Vitals and nursing note reviewed.  Constitutional:      General: She is awake. She is not in acute distress.    Appearance: Normal appearance. She is well-developed, well-groomed and normal weight. She is not ill-appearing, toxic-appearing or diaphoretic.  HENT:     Head: Normocephalic and atraumatic.     Jaw: There is normal jaw occlusion. No trismus, tenderness, swelling, pain on movement or malocclusion.     Salivary Glands: Right salivary gland is not diffusely enlarged or tender. Left salivary gland is not diffusely enlarged or tender.     Right Ear: Hearing, ear canal and external ear normal. A middle ear effusion is present.     Left Ear: Hearing, ear canal and external ear normal. A middle ear effusion is present.     Nose: Nose normal. No congestion or rhinorrhea.     Right Turbinates: Not enlarged, swollen or pale.     Left Turbinates: Not enlarged, swollen or pale.     Right Sinus: No maxillary sinus tenderness or frontal sinus tenderness.     Left Sinus: No maxillary sinus tenderness or frontal sinus tenderness.     Mouth/Throat:     Lips: Pink. No lesions.     Mouth: Mucous membranes are moist. No lacerations, oral lesions or angioedema.     Dentition: No dental abscesses or gum lesions.     Tongue: No lesions. Tongue does  not deviate from midline.     Palate: No mass and lesions.     Pharynx: Uvula midline. Pharyngeal swelling and posterior oropharyngeal erythema present. No oropharyngeal exudate.     Tonsils: No tonsillar exudate. 0 on the right. 0 on the left.     Comments: Bilateral allergic shiners; cobblestoning posterior  pharynx; bilateral TMs air fluid level clear Eyes:     General: Lids are normal. Vision grossly intact. Gaze aligned appropriately. Allergic shiner present. No scleral icterus.       Right eye: No discharge.        Left eye: No discharge.     Extraocular Movements: Extraocular movements intact.     Conjunctiva/sclera: Conjunctivae normal.     Pupils: Pupils are equal, round, and reactive to light.  Neck:     Trachea: Trachea and phonation normal. No tracheal deviation.  Cardiovascular:     Rate and Rhythm: Normal rate and regular rhythm.     Chest Wall: PMI is not displaced.     Pulses: Normal pulses.          Radial pulses are 2+ on the right side and 2+ on the left side.     Heart sounds: Normal heart sounds, S1 normal and S2 normal. Heart sounds not distant. No murmur heard.   No friction rub. No gallop.  Pulmonary:     Effort: Pulmonary effort is normal. No respiratory distress.     Breath sounds: Normal breath sounds and air entry. No stridor, decreased air movement or transmitted upper airway sounds. No decreased breath sounds, wheezing, rhonchi or rales.     Comments: Spoke full sentences without difficulty; no cough observed in exam room; wearing cloth mask Abdominal:     General: Abdomen is flat. Bowel sounds are normal.     Palpations: Abdomen is soft.  Musculoskeletal:        General: No swelling, tenderness, deformity or signs of injury. Normal range of motion.     Cervical back: Normal range of motion and neck supple. No edema, erythema, signs of trauma, rigidity, torticollis, tenderness or crepitus. No pain with movement. Normal range of motion.     Right lower leg: No edema.     Left lower leg: No edema.  Lymphadenopathy:     Head:     Right side of head: No submandibular or preauricular adenopathy.     Left side of head: No submandibular or preauricular adenopathy.     Cervical: No cervical adenopathy.     Right cervical: No superficial cervical adenopathy.    Left  cervical: No superficial cervical adenopathy.  Skin:    General: Skin is warm and dry.     Capillary Refill: Capillary refill takes less than 2 seconds.     Coloration: Skin is not ashen, cyanotic, jaundiced, mottled, pale or sallow.     Findings: No abrasion, abscess, acne, bruising, burn, ecchymosis, erythema, signs of injury, laceration, lesion, petechiae, rash or wound.     Nails: There is no clubbing.  Neurological:     General: No focal deficit present.     Mental Status: She is alert and oriented to person, place, and time. Mental status is at baseline.     GCS: GCS eye subscore is 4. GCS verbal subscore is 5. GCS motor subscore is 6.     Cranial Nerves: Cranial nerves 2-12 are intact. No cranial nerve deficit, dysarthria or facial asymmetry.     Sensory: Sensation is intact. No sensory deficit.  Motor: Motor function is intact. No weakness, tremor, atrophy, abnormal muscle tone or seizure activity.     Coordination: Coordination is intact. Coordination normal.     Gait: Gait is intact. Gait normal.     Comments: In/out of chair without difficulty; gait sure and steady in clinic; bilateral hand grasp equal 5/5  Psychiatric:        Attention and Perception: Attention and perception normal.        Mood and Affect: Mood and affect normal.        Speech: Speech normal.        Behavior: Behavior normal. Behavior is cooperative.        Thought Content: Thought content normal.        Cognition and Memory: Cognition and memory normal.        Judgment: Judgment normal.          Assessment & Plan:   A-acute rhinitis and cough  P-post covid will continue to monitor but currently post nasal drip clear scant afebrile still noted and most likely cause of her cough.  Dry short mild cough occurred in clinic exam room able to speak full sentences without difficulty.  BBS CTA, afebrile sp02 99% RA.  Discussed dry up post nasal drip and cough will probably resolve.  Given plain  phenylephrine UD x 4 and cough and cold multiformula with dextromethorphan, tylenol, phenyphrine for her to trial to see which works best po QID prn cough/rhintiis.  May continue honey 1 tablespoon every 4 hours prn cough.  No antibiotics or steroids indicated at this time.  Did not want prescription cough suppressant.  Exitcare handout cough/rhinitis.  Patient to continue mask wear at work until symptoms resolved.  Patient verbalized understanding information/instructions, agreed with plan of care and had no further questions at this time.  Follow up if cough worsening or not improving over the next 7 days.

## 2021-06-23 NOTE — Patient Instructions (Signed)
How to Perform a Sinus Rinse A sinus rinse is a home treatment that is used to rinse your sinuses with a germ-free (sterile) mixture of salt and water (saline solution). Sinuses are air-filled spaces in your skull that are behind the bones of your face and forehead. They open into your nasal cavity. A sinus rinse can help to clear mucus, dirt, dust, or pollen from your nasal cavity. You may do a sinus rinse when you have a cold, a virus, nasal allergy symptoms, a sinus infection, or stuffiness in your nose or sinuses. What are the risks? A sinus rinse is generally safe and effective. However, there are a few risks, which include: A burning sensation in your sinuses. This may happen if you do not make the saline solution as directed. Be sure to follow all directions when making the saline solution. Nasal irritation. Infection. This may be from unclean supplies or from contaminated water. Infection from contaminated water is rare, but possible. Do not do a sinus rinse if you have had ear or nasal surgery, ear infection, or plugged ears, unless recommended by your health care provider. Supplies needed: Saline solution or powder. Distilled or sterile water to mix with saline powder. You may use boiled and cooled tap water. Boil tap water for 5 minutes; cool until it is lukewarm. Use within 24 hours. Do not use regular tap water to mix with the saline solution. Neti pot or nasal rinse bottle. These supplies release the saline solution into your nose and through your sinuses. Neti pots and nasal rinse bottles can be purchased at [employer], a health food store, or online. How to perform a sinus rinse  Wash your hands with soap and water for at least 20 seconds. If soap and water are not available, use hand sanitizer. Wash your device according to the directions that came with the product and then dry it. Use the solution that comes with your product or one that is sold separately in stores.  Follow the mixing directions on the package to mix with sterile or distilled water. Fill the device with the amount of saline solution noted in the device instructions. Stand by a sink and tilt your head sideways over the sink. Place the spout of the device in your upper nostril (the one closer to the ceiling). Gently pour or squeeze the saline solution into your nasal cavity. The liquid should drain out from the lower nostril if you are not too congested. While rinsing, breathe through your open mouth. Gently blow your nose to clear any mucus and rinse solution. Blowing too hard may cause ear pain. Turn your head in the other direction and repeat in your other nostril. Clean and rinse your device with clean water and then air-dry it. Talk with your health care provider or pharmacist if you have questions about how to do a sinus rinse. Summary A sinus rinse is a home treatment that is used to rinse your sinuses with a sterile mixture of salt and water (saline solution). You may do a sinus rinse when you have a cold, a virus, nasal allergy symptoms, a sinus infection, or stuffiness in your nose or sinuses. A sinus rinse is generally safe and effective. Follow all instructions carefully. This information is not intended to replace advice given to you by your health care provider. Make sure you discuss any questions you have with your health care provider. Document Revised: 12/08/2020 Document Reviewed: 12/08/2020 Elsevier Patient Education  [place]. Sinusitis,  Adult Sinusitis is inflammation of your sinuses. Sinuses are hollow spaces in the bones around your face. Your sinuses are located: Around your eyes. In the middle of your forehead. Behind your nose. In your cheekbones. Mucus normally drains out of your sinuses. When your nasal tissues become inflamed or swollen, mucus can become trapped or blocked. This allows bacteria, viruses, and fungi to grow, which leads to infection. Most  infections of the sinuses are caused by a virus. Sinusitis can develop quickly. It can last for up to 4 weeks (acute) or for more than 12 weeks (chronic). Sinusitis often develops after a cold. What are the causes? This condition is caused by anything that creates swelling in the sinuses or stops mucus from draining. This includes: Allergies. Asthma. Infection from bacteria or viruses. Deformities or blockages in your nose or sinuses. Abnormal growths in the nose (nasal polyps). Pollutants, such as chemicals or irritants in the air. Infection from fungi (rare). What increases the risk? You are more likely to develop this condition if you: Have a weak body defense system (immune system). Do a lot of swimming or diving. Overuse nasal sprays. Smoke. What are the signs or symptoms? The main symptoms of this condition are pain and a feeling of pressure around the affected sinuses. Other symptoms include: Stuffy nose or congestion. Thick drainage from your nose. Swelling and warmth over the affected sinuses. Headache. Upper toothache. A cough that may get worse at night. Extra mucus that collects in the throat or the back of the nose (postnasal drip). Decreased sense of smell and taste. Fatigue. A fever. Sore throat. Bad breath. How is this diagnosed? This condition is diagnosed based on: Your symptoms. Your medical history. A physical exam. Tests to find out if your condition is acute or chronic. This may include: Checking your nose for nasal polyps. Viewing your sinuses using a device that has a light (endoscope). Testing for allergies or bacteria. Imaging tests, such as an MRI or CT scan. In rare cases, a bone biopsy may be done to rule out more serious types of fungal sinus disease. How is this treated? Treatment for sinusitis depends on the cause and whether your condition is chronic or acute. If caused by a virus, your symptoms should go away on their own within 10 days.  You may be given medicines to relieve symptoms. They include: Medicines that shrink swollen nasal passages (topical intranasal decongestants). Medicines that treat allergies (antihistamines). A spray that eases inflammation of the nostrils (topical intranasal corticosteroids). Rinses that help get rid of thick mucus in your nose (nasal saline washes). If caused by bacteria, your health care provider may recommend waiting to see if your symptoms improve. Most bacterial infections will get better without antibiotic medicine. You may be given antibiotics if you have: A severe infection. A weak immune system. If caused by narrow nasal passages or nasal polyps, you may need to have surgery. Follow these instructions at home: Medicines Take, use, or apply over-the-counter and prescription medicines only as told by your health care provider. These may include nasal sprays. If you were prescribed an antibiotic medicine, take it as told by your health care provider. Do not stop taking the antibiotic even if you start to feel better. Hydrate and humidify  Drink enough fluid to keep your urine pale yellow. Staying hydrated will help to thin your mucus. Use a cool mist humidifier to keep the humidity level in your home above 50%. Inhale steam for 10-15 minutes, 3-4 times  a day, or as told by your health care provider. You can do this in the bathroom while a hot shower is running. Limit your exposure to cool or dry air. Rest Rest as much as possible. Sleep with your head raised (elevated). Make sure you get enough sleep each night. General instructions  Apply a warm, moist washcloth to your face 3-4 times a day or as told by your health care provider. This will help with discomfort. Wash your hands often with soap and water to reduce your exposure to germs. If soap and water are not available, use hand sanitizer. Do not smoke. Avoid being around people who are smoking (secondhand smoke). Keep all  follow-up visits as told by your health care provider. This is important. Contact a health care provider if: You have a fever. Your symptoms get worse. Your symptoms do not improve within 10 days. Get help right away if: You have a severe headache. You have persistent vomiting. You have severe pain or swelling around your face or eyes. You have vision problems. You develop confusion. Your neck is stiff. You have trouble breathing. Summary Sinusitis is soreness and inflammation of your sinuses. Sinuses are hollow spaces in the bones around your face. This condition is caused by nasal tissues that become inflamed or swollen. The swelling traps or blocks the flow of mucus. This allows bacteria, viruses, and fungi to grow, which leads to infection. If you were prescribed an antibiotic medicine, take it as told by your health care provider. Do not stop taking the antibiotic even if you start to feel better. Keep all follow-up visits as told by your health care provider. This is important. This information is not intended to replace advice given to you by your health care provider. Make sure you discuss any questions you have with your health care provider. Document Revised: 11/21/2017 Document Reviewed: 11/21/2017 Elsevier Patient Education  2022 [place]. Cough, Adult Coughing is a reflex that clears your throat and your airways (respiratory system). Coughing helps to heal and protect your lungs. It is normal to cough occasionally, but a cough that happens with other symptoms or lasts a long time may be a sign of a condition that needs treatment. An acute cough may only last 2-3 weeks, while a chronic cough may last 8 or more weeks. Coughing is commonly caused by: Infection of the respiratory systemby viruses or bacteria. Breathing in substances that irritate your lungs. Allergies. Asthma. Mucus that runs down the back of your throat (postnasal drip). Smoking. Acid backing up from the  stomach into the esophagus (gastroesophageal reflux). Certain medicines. Chronic lung problems. Other medical conditions such as heart failure or a blood clot in the lung (pulmonary embolism). Follow these instructions at home: Medicines Take over-the-counter and prescription medicines only as told by your health care provider. Talk with your health care provider before you take a cough suppressant medicine. Lifestyle  Avoid cigarette smoke. Do not use any products that contain nicotine or tobacco, such as cigarettes, e-cigarettes, and chewing tobacco. If you need help quitting, ask your health care provider. Drink enough fluid to keep your urine pale yellow. Avoid caffeine. Do not drink alcohol if your health care provider tells you not to drink. General instructions  Pay close attention to changes in your cough. Tell your health care provider about them. Always cover your mouth when you cough. Avoid things that make you cough, such as perfume, candles, cleaning products, or campfire or tobacco smoke. If the air  is dry, use a cool mist vaporizer or humidifier in your bedroom or your home to help loosen secretions. If your cough is worse at night, try to sleep in a semi-upright position. Rest as needed. Keep all follow-up visits as told by your health care provider. This is important. Contact a health care provider if you: Have new symptoms. Cough up pus. Have a cough that does not get better after 2-3 weeks or gets worse. Cannot control your cough with cough suppressant medicines and you are losing sleep. Have pain that gets worse or pain that is not helped with medicine. Have a fever. Have unexplained weight loss. Have night sweats. Get help right away if: You cough up blood. You have difficulty breathing. Your heartbeat is very fast. These symptoms may represent a serious problem that is an emergency. Do not wait to see if the symptoms will go away. Get medical help right away.  Call your local emergency services (911 in the [place] Do not drive yourself to the hospital. Summary Coughing is a reflex that clears your throat and your airways. It is normal to cough occasionally, but a cough that happens with other symptoms or lasts a long time may be a sign of a condition that needs treatment. Take over-the-counter and prescription medicines only as told by your health care provider. Always cover your mouth when you cough. Contact a health care provider if you have new symptoms or a cough that does not get better after 2-3 weeks or gets worse. This information is not intended to replace advice given to you by your health care provider. Make sure you discuss any questions you have with your health care provider. Document Revised: 07/10/2018 Document Reviewed: 07/10/2018 Elsevier Patient Education  [place].

## 2021-06-24 NOTE — Telephone Encounter (Signed)
Patient  seen in clinic 06/23/21 see office note.

## 2021-07-01 ENCOUNTER — Other Ambulatory Visit: Payer: Self-pay | Admitting: Family Medicine

## 2021-07-01 DIAGNOSIS — H539 Unspecified visual disturbance: Secondary | ICD-10-CM

## 2021-07-01 DIAGNOSIS — R519 Headache, unspecified: Secondary | ICD-10-CM

## 2021-07-10 ENCOUNTER — Telehealth: Payer: Self-pay | Admitting: Registered Nurse

## 2021-07-10 ENCOUNTER — Encounter: Payer: Self-pay | Admitting: Registered Nurse

## 2021-07-10 NOTE — Telephone Encounter (Signed)
Follow up with patient 07/09/20 at [facility] stated had appt with cardiology and imaging/MRI pending; carotid ultrasound normal; URI symptoms all resolved post covid infection Dec 2022.

## 2021-07-10 NOTE — Telephone Encounter (Signed)
Carotid Arterial Duplex Study   Patient Name:  DARIYA GAINER [facility]  Date of Exam:   06/18/2021  Medical Rec #: [id number]                Accession #:    [id number]  Date of Birth: [birth year]                Patient Gender: F  Patient Age:   56 years  Exam Location:  [place]  Procedure:      VAS US CAROTID  Referring Phys: Ria Bush    ---------------------------------------------------------------------------  -----     Indications:       Visual disturbance --- Loss of vision in her left eye,  3                     months ago for 30 min. Severe headaches on the left  side of                     her head.  Risk Factors:      Hyperlipidemia, no history of smoking.  Comparison Study:  None   Performing Technologist: Pilar Jarvis RDMS, RVT, RDCS      Examination Guidelines: A complete evaluation includes B-mode imaging,  spectral  Doppler, color Doppler, and power Doppler as needed of all accessible  portions  of each vessel. Bilateral testing is considered an integral part of a  complete  examination. Limited examinations for reoccurring indications may be  performed  as noted.      Right Carotid Findings:  +----------+--------+--------+---------+------------------+--------+              PSV cm/s EDV cm/s Stenosis  Plaque Description Comments   +----------+--------+--------+---------+------------------+--------+   CCA Prox   146      27                                               +----------+--------+--------+---------+------------------+--------+   CCA Distal 89       25                                               +----------+--------+--------+---------+------------------+--------+   ICA Prox   76       26       Normal                                  +----------+--------+--------+---------+------------------+--------+   ICA Mid    74       31       Normal                                  +----------+--------+--------+---------+------------------+--------+    ICA Distal 72       33                                               +----------+--------+--------+---------+------------------+--------+   ECA  87       23       No plaque                               +----------+--------+--------+---------+------------------+--------+   +----------+--------+-------+----------------+-------------------+              PSV cm/s EDV cms Describe         Arm Pressure (mmHG)   +----------+--------+-------+----------------+-------------------+   Subclavian 87       14      Multiphasic, WNL 126                   +----------+--------+-------+----------------+-------------------+   +---------+--------+--+--------+--+---------+   Vertebral PSV cm/s 51 EDV cm/s 15 Antegrade   +---------+--------+--+--------+--+---------+       Left Carotid Findings:  +----------+--------+--------+--------+------------------+--------+              PSV cm/s EDV cm/s Stenosis Plaque Description Comments   +----------+--------+--------+--------+------------------+--------+   CCA Prox   108      26                                              +----------+--------+--------+--------+------------------+--------+   CCA Distal 98       30                                              +----------+--------+--------+--------+------------------+--------+   ICA Prox   64       14       Normal                                 +----------+--------+--------+--------+------------------+--------+   ICA Mid    48       18       Normal                                 +----------+--------+--------+--------+------------------+--------+   ICA Distal 46       20                                              +----------+--------+--------+--------+------------------+--------+   ECA        77       16                                              +----------+--------+--------+--------+------------------+--------+   +----------+--------+--------+----------------+-------------------+              PSV  cm/s EDV cm/s Describe         Arm Pressure (mmHG)   +----------+--------+--------+----------------+-------------------+   Subclavian 174               Multiphasic, WNL 122                   +----------+--------+--------+----------------+-------------------+   +---------+--------+--+--------+--+---------+  Vertebral PSV cm/s 54 EDV cm/s 17 Antegrade   +---------+--------+--+--------+--+---------+           Summary:  Right Carotid: There was no evidence of thrombus, dissection,  atherosclerotic                 plaque or stenosis in the cervical carotid system.   Left Carotid: There was no evidence of thrombus, dissection,  atherosclerotic                plaque or stenosis in the cervical carotid system.   Vertebrals:  Bilateral vertebral arteries demonstrate antegrade flow.  Subclavians: Normal flow hemodynamics were seen in bilateral subclavian               arteries.   *See table(s) above for measurements and observations.       Electronically signed by Quay Burow MD on 06/19/2021 at 6:36:11 AM.         Final    Next appt with cardiology 09/30/2021 and labs scheduled 09/23/2021  MR Brain ordered by Dr Danise Mina 07/01/2021 and scheduling still pending per patient.  F/u visual changes after left temporal headache and rule out arteritis

## 2021-07-22 ENCOUNTER — Other Ambulatory Visit: Payer: Self-pay | Admitting: Family Medicine

## 2021-07-22 DIAGNOSIS — Z1231 Encounter for screening mammogram for malignant neoplasm of breast: Secondary | ICD-10-CM

## 2021-07-28 ENCOUNTER — Encounter: Payer: Self-pay | Admitting: *Deleted

## 2021-07-28 ENCOUNTER — Other Ambulatory Visit: Payer: Self-pay

## 2021-07-28 ENCOUNTER — Ambulatory Visit
Admission: RE | Admit: 2021-07-28 | Discharge: 2021-07-28 | Disposition: A | Discharge status: Home / Self Care | Payer: No Typology Code available for payment source | Source: Ambulatory Visit | Attending: Family Medicine | Admitting: Family Medicine

## 2021-07-28 DIAGNOSIS — Z1231 Encounter for screening mammogram for malignant neoplasm of breast: Secondary | ICD-10-CM

## 2021-07-31 ENCOUNTER — Other Ambulatory Visit: Payer: Self-pay | Admitting: Family Medicine

## 2021-07-31 DIAGNOSIS — R928 Other abnormal and inconclusive findings on diagnostic imaging of breast: Secondary | ICD-10-CM

## 2021-08-10 ENCOUNTER — Other Ambulatory Visit: Payer: Self-pay

## 2021-08-10 ENCOUNTER — Ambulatory Visit
Admission: RE | Admit: 2021-08-10 | Discharge: 2021-08-10 | Disposition: A | Discharge status: Home / Self Care | Payer: No Typology Code available for payment source | Source: Ambulatory Visit | Attending: Family Medicine | Admitting: Family Medicine

## 2021-08-10 DIAGNOSIS — R519 Headache, unspecified: Secondary | ICD-10-CM | POA: Diagnosis present

## 2021-08-10 DIAGNOSIS — H539 Unspecified visual disturbance: Secondary | ICD-10-CM | POA: Insufficient documentation

## 2021-08-13 ENCOUNTER — Other Ambulatory Visit: Payer: Self-pay | Admitting: Family Medicine

## 2021-08-13 DIAGNOSIS — H539 Unspecified visual disturbance: Secondary | ICD-10-CM

## 2021-08-13 DIAGNOSIS — R9089 Other abnormal findings on diagnostic imaging of central nervous system: Secondary | ICD-10-CM | POA: Insufficient documentation

## 2021-08-13 DIAGNOSIS — R519 Headache, unspecified: Secondary | ICD-10-CM

## 2021-08-19 ENCOUNTER — Other Ambulatory Visit: Payer: Self-pay

## 2021-08-19 ENCOUNTER — Ambulatory Visit
Admission: RE | Admit: 2021-08-19 | Discharge: 2021-08-19 | Disposition: A | Discharge status: Home / Self Care | Payer: No Typology Code available for payment source | Source: Ambulatory Visit | Attending: Family Medicine | Admitting: Family Medicine

## 2021-08-19 DIAGNOSIS — R928 Other abnormal and inconclusive findings on diagnostic imaging of breast: Secondary | ICD-10-CM

## 2021-08-20 ENCOUNTER — Encounter: Payer: Self-pay | Admitting: Family

## 2021-08-20 ENCOUNTER — Ambulatory Visit (INDEPENDENT_AMBULATORY_CARE_PROVIDER_SITE_OTHER)
Admission: RE | Admit: 2021-08-20 | Discharge: 2021-08-20 | Disposition: A | Discharge status: Home / Self Care | Payer: No Typology Code available for payment source | Source: Ambulatory Visit | Attending: Family | Admitting: Family

## 2021-08-20 ENCOUNTER — Telehealth: Payer: No Typology Code available for payment source | Admitting: Family

## 2021-08-20 VITALS — BP 110/80 | HR 76 | Ht 62.0 in | Wt 150.0 lb

## 2021-08-20 DIAGNOSIS — J40 Bronchitis, not specified as acute or chronic: Secondary | ICD-10-CM | POA: Diagnosis not present

## 2021-08-20 DIAGNOSIS — R0602 Shortness of breath: Secondary | ICD-10-CM

## 2021-08-20 DIAGNOSIS — J209 Acute bronchitis, unspecified: Secondary | ICD-10-CM

## 2021-08-20 DIAGNOSIS — J45901 Unspecified asthma with (acute) exacerbation: Secondary | ICD-10-CM | POA: Diagnosis not present

## 2021-08-20 MED ORDER — PREDNISONE 20 MG PO TABS: ORAL_TABLET | ORAL | 0 refills | Status: DC

## 2021-08-20 MED ORDER — AMOXICILLIN-POT CLAVULANATE 875-125 MG PO TABS: 1.0000 | ORAL_TABLET | Freq: Two times a day (BID) | ORAL | 0 refills | Status: DC

## 2021-08-20 NOTE — Patient Instructions (Signed)
Complete xray(s) prior to leaving today. I will notify you of your results once received.  Antibiotic sent to preferred pharmacy.  Also sent steroid to help you breath better.   Please increase oral fluids, steamy hot shower/humidifier prn.  Please follow up if no improvement in 2-3 days.   It was a pleasure seeing you today! Please do not hesitate to reach out with any questions and or concerns.  Regards,   Eugenia Pancoast

## 2021-08-20 NOTE — Progress Notes (Signed)
Established Patient Office Visit  Subjective:  Patient ID: Isabella Robertson, female    DOB: 08/24/1965  Age: 56 y.o. MRN: [id number]  CC:  Chief Complaint  Patient presents with   Nasal Congestion    Congestion, coughing with yellow mucus--8 days.     HPI Isabella Robertson is here today with concerns.   Seven days ago started with fatigue, cough productive with yellow mucous, and also chest congestion.  She has sore throat, nasal congestion, sinus pressure,  and increasing shortness of breath. She has been having to use her inhaler more often, 1-2 times  day.   Tested for covid seven days ago and was negative.   Past Medical History:  Diagnosis Date   Allergy    Asthma    triggers are dust, smoke, weather changes   Chronic abdominal pain    Diffuse cystic mastopathy    Diverticulitis    E coli infection    abdomen   Endometriosis    History of; Now s/p hysterectomy.   Genetic screening 11/28/2012   negative /[facility]   Helicobacter pylori (H. pylori) infection    x 3 per as of 06/2017    IBS (irritable bowel syndrome)    diarrhea type per pt as of 06/2017     Past Surgical History:  Procedure Laterality Date   ABDOMINAL HYSTERECTOMY  2007   ovaries remain   CESAREAN SECTION  2003   COLONOSCOPY  2010   COLONOSCOPY  04/2020   hem, diverticulosis rpt 10 yrs [facility])   COLONOSCOPY WITH PROPOFOL N/A 04/08/2017   Procedure: COLONOSCOPY WITH PROPOFOL;  Surgeon: Lollie Sails, MD;  Location: [facility] ENDOSCOPY;  Service: Endoscopy;  Laterality: N/A;   ECTOPIC PREGNANCY SURGERY  4098,1191   ESOPHAGOGASTRODUODENOSCOPY  04/2020   chronic gastritis, reflux esophagitis, neg H pylori - rec PPI BID x 12 wks ([facility])   ESOPHAGOGASTRODUODENOSCOPY  10/2020   normal esophagus, mild gastropathy, no H pylori, duodenal diverticulum ([facility])   EYE SURGERY  2003   HERNIA REPAIR  4782   umbilical   UPPER GASTROINTESTINAL ENDOSCOPY      Family History  Problem  Relation Age of Onset   Breast cancer Mother 49   High blood pressure Mother    Hyperlipidemia Mother    Heart attack Father 75   High blood pressure Father    Hyperlipidemia Father    Breast cancer Paternal Aunt    Breast cancer Cousin        breast - paternal side   Colon cancer Neg Hx    Stomach cancer Neg Hx    Esophageal cancer Neg Hx    Pancreatic cancer Neg Hx    Rectal cancer Neg Hx     Social History   Socioeconomic History   Marital status: Married    Spouse name: Not on file   Number of children: Not on file   Years of education: Not on file   Highest education level: Not on file  Occupational History   Not on file  Tobacco Use   Smoking status: Never   Smokeless tobacco: Never  Vaping Use   Vaping Use: Never used  Substance and Sexual Activity   Alcohol use: No   Drug use: No   Sexual activity: Not Currently  Other Topics Concern   Not on file  Social History Narrative   Lives with husband and daughter    Occ: Replacement limited since 2006    Activity: walks regularly  at work    Diet: good water intake, fruits/vegetables daily    Social Determinants of Health   Financial Resource Strain: Not on file  Food Insecurity: Not on file  Transportation Needs: Not on file  Physical Activity: Not on file  Stress: Not on file  Social Connections: Not on file  Intimate Partner Violence: Not on file    Outpatient Medications Prior to Visit  Medication Sig Dispense Refill   albuterol (VENTOLIN HFA) 108 (90 Base) MCG/ACT inhaler Inhale 1-2 puffs into the lungs every 4 (four) hours as needed for wheezing or shortness of breath. 6.7 g 1   aspirin 81 MG EC tablet Take 1 tablet (81 mg total) by mouth daily. Swallow whole.     atorvastatin (LIPITOR) 40 MG tablet Take 1 tablet (40 mg total) by mouth daily. 30 tablet 11   Cetirizine HCl (ZYRTEC ALLERGY) 10 MG CAPS Take 10 mg by mouth daily.     Cholecalciferol (VITAMIN D) 50 MCG (2000 UT) CAPS Take 1 capsule (2,000  Units total) by mouth daily. 30 capsule    dicyclomine (BENTYL) 10 MG capsule Take 10 mg by mouth 2 (two) times daily.     montelukast (SINGULAIR) 10 MG tablet Take 1 tablet (10 mg total) by mouth daily as needed.     omeprazole (PRILOSEC) 40 MG capsule Take 1 capsule (40 mg total) by mouth 2 (two) times daily. 180 capsule 3   No facility-administered medications prior to visit.    Allergies  Allergen Reactions   Shrimp [Shellfish Allergy]     ROS Review of Systems  Constitutional:  Negative for chills and fever.  HENT:  Positive for congestion, ear pain and sore throat. Negative for sinus pressure.   Respiratory:  Positive for cough, shortness of breath and wheezing.   Cardiovascular:  Negative for chest pain and palpitations.     Objective:    Physical Exam Constitutional:      General: She is not in acute distress.    Appearance: Normal appearance. She is normal weight. She is ill-appearing. She is not toxic-appearing or diaphoretic.  HENT:     Head: Normocephalic.     Right Ear: Tympanic membrane normal.     Left Ear: Tympanic membrane normal.     Nose: Nose normal.     Mouth/Throat:     Mouth: Mucous membranes are moist.  Eyes:     Pupils: Pupils are equal, round, and reactive to light.  Cardiovascular:     Rate and Rhythm: Normal rate and regular rhythm.  Pulmonary:     Breath sounds: Decreased air movement present. Examination of the right-upper field reveals decreased breath sounds. Examination of the left-lower field reveals decreased breath sounds. Decreased breath sounds present. No wheezing, rhonchi or rales.  Neurological:     Mental Status: She is alert.    BP 110/80    Pulse 76    Ht '5\' 2"'  (1.575 m)    Wt 150 lb (68 kg)    SpO2 96%    BMI 27.44 kg/m  Wt Readings from Last 3 Encounters:  08/20/21 150 lb (68 kg)  05/06/21 150 lb 2 oz (68.1 kg)  10/20/20 145 lb (65.8 kg)     Health Maintenance Due  Topic Date Due   HIV Screening  Never done   Zoster  Vaccines- Shingrix (1 of 2) Never done   COVID-19 Vaccine (4 - Booster for Pfizer series) 08/28/2020    There are no preventive care reminders to  display for this patient.  Lab Results  Component Value Date   TSH 1.38 05/06/2021   Lab Results  Component Value Date   WBC WILL FOLLOW 05/01/2021   HGB WILL FOLLOW 05/01/2021   HCT WILL FOLLOW 05/01/2021   MCV WILL FOLLOW 05/01/2021   PLT WILL FOLLOW 05/01/2021   Lab Results  Component Value Date   NA 141 05/01/2021   K 4.3 05/01/2021   CO2 23 05/01/2021   GLUCOSE 89 05/01/2021   BUN 14 05/01/2021   CREATININE 0.87 05/01/2021   BILITOT 0.6 05/01/2021   ALKPHOS 85 05/01/2021   AST 35 05/01/2021   ALT 47 (H) 05/01/2021   PROT 7.6 05/01/2021   ALBUMIN 4.7 05/01/2021   CALCIUM 9.6 05/01/2021   ANIONGAP 9 01/15/2020   EGFR 79 05/01/2021   GFR 83.93 06/20/2017   Lab Results  Component Value Date   HGBA1C 5.3 10/20/2020      Assessment & Plan:   Problem List Items Addressed This Visit       Respiratory   Bronchitis   Relevant Medications   amoxicillin-clavulanate (AUGMENTIN) 875-125 MG tablet   predniSONE (DELTASONE) 20 MG tablet   Acute bronchitis with asthma with acute exacerbation    Augmentin sent to pharmacy chest x-ray also ordered pending results to rule out pneumonia.  Patient to follow-up if no improvement in symptoms in the next 2 to 3 days.      Relevant Medications   predniSONE (DELTASONE) 20 MG tablet     Other   Shortness of breath - Primary    Decreased breath sounds on physical exam I have ordered a chest x-ray to rule out pneumonia.  I have also sent a prednisone 40 mg once daily for 5 days to patient to get started.  If worsening shortness of breath please go to the emergency room.  Patient to continue with albuterol as needed      Relevant Orders   DG Chest 2 View    Meds ordered this encounter  Medications   amoxicillin-clavulanate (AUGMENTIN) 875-125 MG tablet    Sig: Take 1 tablet by  mouth 2 (two) times daily.    Dispense:  20 tablet    Refill:  0    Order Specific Question:   Supervising Provider    Answer:   BEDSOLE, AMY E [[id number]]   predniSONE (DELTASONE) 20 MG tablet    Sig: Take two tablets once daily (total 40 mg) for five days    Dispense:  10 tablet    Refill:  0    Order Specific Question:   Supervising Provider    Answer:   Diona Browner, AMY E [[id number]]    Follow-up: Return if symptoms worsen or fail to improve.    Eugenia Pancoast, FNP

## 2021-08-20 NOTE — Assessment & Plan Note (Signed)
Augmentin sent to pharmacy chest x-ray also ordered pending results to rule out pneumonia.  Patient to follow-up if no improvement in symptoms in the next 2 to 3 days.

## 2021-08-20 NOTE — Assessment & Plan Note (Signed)
Decreased breath sounds on physical exam I have ordered a chest x-ray to rule out pneumonia.  I have also sent a prednisone 40 mg once daily for 5 days to patient to get started.  If worsening shortness of breath please go to the emergency room.  Patient to continue with albuterol as needed

## 2021-08-21 ENCOUNTER — Other Ambulatory Visit: Payer: Self-pay

## 2021-08-21 ENCOUNTER — Ambulatory Visit: Payer: No Typology Code available for payment source | Admitting: Neurology

## 2021-08-21 ENCOUNTER — Encounter: Payer: Self-pay | Admitting: Neurology

## 2021-08-21 VITALS — BP 117/82 | HR 93 | Ht 62.0 in | Wt 151.5 lb

## 2021-08-21 DIAGNOSIS — G43709 Chronic migraine without aura, not intractable, without status migrainosus: Secondary | ICD-10-CM | POA: Diagnosis not present

## 2021-08-21 NOTE — Progress Notes (Signed)
Please let patient know that her recent checks x-ray did come back with bronchial thickening which is consistent with bronchitis and/or her asthma exacerbating.  There are no signs of pneumonia as this is a good sign.  Patient to continue with Augmentin that was the antibiotic that was prescribed as well as the steroid.  Please have patient let me know if her symptoms do not improve in the next few days

## 2021-08-21 NOTE — Progress Notes (Signed)
Chief Complaint  Patient presents with   New Patient (Initial Visit)    Room 14 - alone. She would like to review her recent abnormal MRI brain. Reports left-sided headaches nearly every day. She does not take oral medication for the pain. She lays down to rest which helps. She had one episode of partial vision loss in left eye. This occurred six months ago and lasted around 30 minutes.       ASSESSMENT AND PLAN  Isabella Robertson is a 56 y.o. female   Chronic migraine headaches with aura  MRI findings are most consistent with migraine headaches, no acute abnormalities  I have suggested her  magnesium oxide plus riboflavin twice a day for migraine prevention  NSAIDs or aspirin as needed for abortive treatment,  DIAGNOSTIC DATA (LABS, IMAGING, TESTING) - I reviewed patient records, labs, notes, testing and imaging myself where available.   MEDICAL HISTORY:  Isabella Robertson, is a 56 year old female, seen in request by her primary care physician Dr. Ria Bush, for evaluation of left-sided headache, MRI of the brain, initial evaluation was on August 21, 2021  I reviewed and summarized the referring note. PMHx HLD Allergy  Patient had long history of intermittent headaches, sometimes proceeding by visual distortion, zigzag lines, her headache sometimes associated with nausea, light noise sensitivity, lasting for few hours  She had MRI of the brain of August 10, 2021, due to 1 episode of left temporal area headaches, proceeding by loss of vision on the left side for few minutes,  I personally reviewed MRI of the brain without contrast August 10, 2021, mild T2/FLAIR hyperintensity, nonspecific, most consistent with her history of migraine headache  Laboratory evaluations in November 2022: Normal TSH, B12, ESR,  PHYSICAL EXAM:   Vitals:   08/21/21 0953  BP: 117/82  Pulse: 93  Weight: 151 lb 8 oz (68.7 kg)  Height: '5\' 2"'  (1.575 m)   Not recorded      Body mass index is 27.71 kg/m.  PHYSICAL EXAMNIATION:  Gen: NAD, conversant, well nourised, well groomed                     Cardiovascular: Regular rate rhythm, no peripheral edema, warm, nontender. Eyes: Conjunctivae clear without exudates or hemorrhage Neck: Supple, no carotid bruits. Pulmonary: Clear to auscultation bilaterally   NEUROLOGICAL EXAM:  MENTAL STATUS: Speech:    Speech is normal; fluent and spontaneous with normal comprehension.  Cognition:     Orientation to time, place and person     Normal recent and remote memory     Normal Attention span and concentration     Normal Language, naming, repeating,spontaneous speech     Fund of knowledge   CRANIAL NERVES: CN II: Visual fields are full to confrontation. Pupils are round equal and briskly reactive to light. CN III, IV, VI: extraocular movement are normal. No ptosis. CN V: Facial sensation is intact to light touch CN VII: Face is symmetric with normal eye closure  CN VIII: Hearing is normal to causal conversation. CN IX, X: Phonation is normal. CN XI: Head turning and shoulder shrug are intact  MOTOR: There is no pronator drift of out-stretched arms. Muscle bulk and tone are normal. Muscle strength is normal.  REFLEXES: Reflexes are 2+ and symmetric at the biceps, triceps, knees, and ankles. Plantar responses are flexor.  SENSORY: Intact to light touch, pinprick and vibratory sensation are intact in fingers and toes.  COORDINATION: There is  no trunk or limb dysmetria noted.  GAIT/STANCE: Posture is normal. Gait is steady with normal steps, base, arm swing, and turning. Heel and toe walking are normal. Tandem gait is normal.  Romberg is absent.  REVIEW OF SYSTEMS:  Full 14 system review of systems performed and notable only for as above All other review of systems were negative.   ALLERGIES: Allergies  Allergen Reactions   Shrimp [Shellfish Allergy]     HOME MEDICATIONS: Current  Outpatient Medications  Medication Sig Dispense Refill   albuterol (VENTOLIN HFA) 108 (90 Base) MCG/ACT inhaler Inhale 1-2 puffs into the lungs every 4 (four) hours as needed for wheezing or shortness of breath. 6.7 g 1   amoxicillin-clavulanate (AUGMENTIN) 875-125 MG tablet Take 1 tablet by mouth 2 (two) times daily. 20 tablet 0   aspirin 81 MG EC tablet Take 1 tablet (81 mg total) by mouth daily. Swallow whole.     atorvastatin (LIPITOR) 40 MG tablet Take 1 tablet (40 mg total) by mouth daily. 30 tablet 11   Cetirizine HCl (ZYRTEC ALLERGY) 10 MG CAPS Take 10 mg by mouth as needed.     Cholecalciferol (VITAMIN D) 50 MCG (2000 UT) CAPS Take 1 capsule (2,000 Units total) by mouth daily. 30 capsule    dicyclomine (BENTYL) 10 MG capsule Take 10 mg by mouth 2 (two) times daily.     montelukast (SINGULAIR) 10 MG tablet Take 1 tablet (10 mg total) by mouth daily as needed.     omeprazole (PRILOSEC) 40 MG capsule Take 1 capsule (40 mg total) by mouth 2 (two) times daily. 180 capsule 3   predniSONE (DELTASONE) 20 MG tablet Take two tablets once daily (total 40 mg) for five days 10 tablet 0   No current facility-administered medications for this visit.    PAST MEDICAL HISTORY: Past Medical History:  Diagnosis Date   Allergy    Asthma    triggers are dust, smoke, weather changes   Chronic abdominal pain    Diffuse cystic mastopathy    Diverticulitis    E coli infection    abdomen   Endometriosis    History of; Now s/p hysterectomy.   Genetic screening 11/28/2012   negative /[facility]   Helicobacter pylori (H. pylori) infection    x 3 per as of 06/2017    IBS (irritable bowel syndrome)    diarrhea type per pt as of 06/2017     PAST SURGICAL HISTORY: Past Surgical History:  Procedure Laterality Date   ABDOMINAL HYSTERECTOMY  2007   ovaries remain   CESAREAN SECTION  2003   COLONOSCOPY  2010   COLONOSCOPY  04/2020   hem, diverticulosis rpt 10 yrs [facility])   COLONOSCOPY WITH PROPOFOL N/A  04/08/2017   Procedure: COLONOSCOPY WITH PROPOFOL;  Surgeon: Lollie Sails, MD;  Location: [facility] ENDOSCOPY;  Service: Endoscopy;  Laterality: N/A;   ECTOPIC PREGNANCY SURGERY  0623,7628   ESOPHAGOGASTRODUODENOSCOPY  04/2020   chronic gastritis, reflux esophagitis, neg H pylori - rec PPI BID x 12 wks ([facility])   ESOPHAGOGASTRODUODENOSCOPY  10/2020   normal esophagus, mild gastropathy, no H pylori, duodenal diverticulum ([facility])   EYE SURGERY  2003   HERNIA REPAIR  3151   umbilical   UPPER GASTROINTESTINAL ENDOSCOPY      FAMILY HISTORY: Family History  Problem Relation Age of Onset   Breast cancer Mother 28   High blood pressure Mother    Hyperlipidemia Mother    Heart attack Father 27   High blood  pressure Father    Hyperlipidemia Father    Breast cancer Paternal Aunt    Breast cancer Cousin        breast - paternal side   Colon cancer Neg Hx    Stomach cancer Neg Hx    Esophageal cancer Neg Hx    Pancreatic cancer Neg Hx    Rectal cancer Neg Hx     SOCIAL HISTORY: Social History   Socioeconomic History   Marital status: Married    Spouse name: Not on file   Number of children: 1   Years of education: college   Highest education level: Not on file  Occupational History   Occupation: Teaching laboratory technician - admin work/inventory  Tobacco Use   Smoking status: Never   Smokeless tobacco: Never  Vaping Use   Vaping Use: Never used  Substance and Sexual Activity   Alcohol use: No   Drug use: No   Sexual activity: Not Currently  Other Topics Concern   Not on file  Social History Narrative   Lives with husband and daughter    Occ: Replacement limited since 2006    Activity: walks regularly at work    Diet: good water intake, fruits/vegetables daily    Left-handed.   One cup caffeine daily.    Social Determinants of Health   Financial Resource Strain: Not on file  Food Insecurity: Not on file  Transportation Needs: Not on file  Physical Activity: Not on  file  Stress: Not on file  Social Connections: Not on file  Intimate Partner Violence: Not on file      Marcial Pacas, M.D. Ph.D.  [facility] [address], [place], [place] [address] Ph: [phone] Fax: [phone]  CC:  Ria Bush, MD [place],  [place] [address]  Ria Bush, MD

## 2021-08-25 ENCOUNTER — Other Ambulatory Visit: Payer: No Typology Code available for payment source

## 2021-09-22 ENCOUNTER — Other Ambulatory Visit: Payer: Self-pay | Admitting: Family Medicine

## 2021-09-22 DIAGNOSIS — E785 Hyperlipidemia, unspecified: Secondary | ICD-10-CM

## 2021-09-23 ENCOUNTER — Other Ambulatory Visit: Payer: Self-pay

## 2021-09-23 ENCOUNTER — Other Ambulatory Visit (INDEPENDENT_AMBULATORY_CARE_PROVIDER_SITE_OTHER): Payer: No Typology Code available for payment source

## 2021-09-23 DIAGNOSIS — E785 Hyperlipidemia, unspecified: Secondary | ICD-10-CM

## 2021-09-23 LAB — LIPID PANEL
Cholesterol: 125 mg/dL (ref 0–200)
HDL: 44.3 mg/dL (ref >39.00)
LDL Cholesterol: 47 mg/dL (ref 0–99)
NonHDL: 80.23
Total CHOL/HDL Ratio: 3
Triglycerides: 168 mg/dL — ABNORMAL HIGH (ref 0.0–149.0)
VLDL: 33.6 mg/dL (ref 0.0–40.0)

## 2021-09-30 ENCOUNTER — Encounter: Payer: Self-pay | Admitting: Family Medicine

## 2021-09-30 ENCOUNTER — Ambulatory Visit (INDEPENDENT_AMBULATORY_CARE_PROVIDER_SITE_OTHER): Payer: No Typology Code available for payment source | Admitting: Family Medicine

## 2021-09-30 VITALS — BP 114/76 | HR 86 | Temp 98.0°F | Ht 62.0 in | Wt 142.5 lb

## 2021-09-30 DIAGNOSIS — K2100 Gastro-esophageal reflux disease with esophagitis, without bleeding: Secondary | ICD-10-CM | POA: Diagnosis not present

## 2021-09-30 DIAGNOSIS — G43709 Chronic migraine without aura, not intractable, without status migrainosus: Secondary | ICD-10-CM | POA: Diagnosis not present

## 2021-09-30 DIAGNOSIS — J301 Allergic rhinitis due to pollen: Secondary | ICD-10-CM | POA: Diagnosis not present

## 2021-09-30 DIAGNOSIS — J4520 Mild intermittent asthma, uncomplicated: Secondary | ICD-10-CM

## 2021-09-30 DIAGNOSIS — E782 Mixed hyperlipidemia: Secondary | ICD-10-CM | POA: Diagnosis not present

## 2021-09-30 MED ORDER — CO Q-10 100 MG PO CAPS: 1.0000 | ORAL_CAPSULE | Freq: Every day | ORAL | 0 refills | Status: AC

## 2021-09-30 MED ORDER — MAGNESIUM 400 MG PO TABS: 1.0000 | ORAL_TABLET | Freq: Every day | ORAL | Status: AC

## 2021-09-30 MED ORDER — RIBOFLAVIN 400 MG PO TABS: 1.0000 | ORAL_TABLET | Freq: Every day | ORAL | Status: DC

## 2021-09-30 NOTE — Patient Instructions (Addendum)
Busque supplemento riboflavina (vitamina B2) '400mg'$  diarios igual que magnesium oxide 250-'400mg'$  diarios. El magnesio puede causar soltura/diarrhea.  ?Estos supplementos pueden ayudar a prevenir migra?as.  ?Use ibuprofeno '600mg'$  cuando tenga migra?a fuerte.  ?Para dolor de piernas puede tratar CoEnzyme Q10 suplemento '100mg'$  diarios sin receta. Tomelo por 1 mes, si nota beneficio, siga tomando.  ?Cambie cita de 11/2021 a despues de 05/06/2022 para fisico.  ?

## 2021-09-30 NOTE — Progress Notes (Signed)
? ? Patient ID: Isabella Robertson, female    DOB: 03-15-1966, 56 y.o.   MRN: [id number] ? ?This visit was conducted in person. ? ?BP 114/76   Pulse 86   Temp 98 ?F (36.7 ?C) (Temporal)   Ht '5\' 2"'$  (1.575 m)   Wt 142 lb 8 oz (64.6 kg)   SpO2 95%   BMI 26.06 kg/m?   ? ?CC: 4 mo f/u visit  ?Subjective:  ? ?HPI: ?Isabella Robertson is a 56 y.o. female presenting on 09/30/2021 for Hyperlipidemia (Here for 3 mo f/u.) ? ? ?See prior note for details.  ?Seen for CPE 05/2021 at that time endorsed episode of visual disturbance manifesting as trouble seeing left side of visual field ie only seeing "00" with 500 number, only seeing 1 of 2 coworkers in front of her.  ?We started statin, aspirin. Brain MR reassuring - nonspecific changes consistent with migraine headache history.  ? ?She subsequently saw neurology - dx chronic migraine with aura, started on magnesium and riboflavin for prevention and NSAIDs or aspirin for abortive treatment. No f/u recommended. She hasn't started supplements yet.  ? ?She continues to have ongoing headaches but overall improved - mild AH about 2x/wk, activity limiting HA - 1 in the past 2 months. Trigger - highly fragranced cream/lotion. Notes improvement with healthier diet/lifestyle and  ? ?Fmhx migraines - mother and father as well.  ? ?HLD - cholesterol markedly elevated at that time, we started atorvastatin '40mg'$  daily with benefit. Tolerating med well without myalgias. We also started aspirin '81mg'$  daily. She has also implemented healthy diet changes and lost 9 lbs ?   ? ?Relevant past medical, surgical, family and social history reviewed and updated as indicated. Interim medical history since our last visit reviewed. ?Allergies and medications reviewed and updated. ?Outpatient Medications Prior to Visit  ?Medication Sig Dispense Refill  ? albuterol (VENTOLIN HFA) 108 (90 Base) MCG/ACT inhaler Inhale 1-2 puffs into the lungs every 4 (four) hours as needed for wheezing or  shortness of breath. 6.7 g 1  ? aspirin 81 MG EC tablet Take 1 tablet (81 mg total) by mouth daily. Swallow whole.    ? atorvastatin (LIPITOR) 40 MG tablet Take 1 tablet (40 mg total) by mouth daily. 30 tablet 11  ? Cetirizine HCl (ZYRTEC ALLERGY) 10 MG CAPS Take 10 mg by mouth as needed.    ? Cholecalciferol (VITAMIN D) 50 MCG (2000 UT) CAPS Take 1 capsule (2,000 Units total) by mouth daily. 30 capsule   ? montelukast (SINGULAIR) 10 MG tablet Take 1 tablet (10 mg total) by mouth daily as needed.    ? omeprazole (PRILOSEC) 40 MG capsule Take 1 capsule (40 mg total) by mouth 2 (two) times daily. 180 capsule 3  ? amoxicillin-clavulanate (AUGMENTIN) 875-125 MG tablet Take 1 tablet by mouth 2 (two) times daily. 20 tablet 0  ? dicyclomine (BENTYL) 10 MG capsule Take 10 mg by mouth 2 (two) times daily.    ? predniSONE (DELTASONE) 20 MG tablet Take two tablets once daily (total 40 mg) for five days 10 tablet 0  ? ?No facility-administered medications prior to visit.  ?  ? ?Per HPI unless specifically indicated in ROS section below ?Review of Systems ? ?Objective:  ?BP 114/76   Pulse 86   Temp 98 ?F (36.7 ?C) (Temporal)   Ht '5\' 2"'$  (1.575 m)   Wt 142 lb 8 oz (64.6 kg)   SpO2 95%   BMI 26.06 kg/m?   ?  Wt Readings from Last 3 Encounters:  ?09/30/21 142 lb 8 oz (64.6 kg)  ?08/21/21 151 lb 8 oz (68.7 kg)  ?08/20/21 150 lb (68 kg)  ?  ?  ?Physical Exam ?Vitals and nursing note reviewed.  ?Constitutional:   ?   Appearance: Normal appearance. She is not ill-appearing.  ?Cardiovascular:  ?   Rate and Rhythm: Normal rate and regular rhythm.  ?   Pulses: Normal pulses.  ?   Heart sounds: Normal heart sounds. No murmur heard. ?Pulmonary:  ?   Effort: Pulmonary effort is normal. No respiratory distress.  ?   Breath sounds: Normal breath sounds. No wheezing, rhonchi or rales.  ?Musculoskeletal:  ?   Right lower leg: No edema.  ?   Left lower leg: No edema.  ?Skin: ?   General: Skin is warm and dry.  ?   Findings: No rash.   ?Neurological:  ?   Mental Status: She is alert.  ?Psychiatric:     ?   Mood and Affect: Mood normal.     ?   Behavior: Behavior normal.  ? ?   ?Results for orders placed or performed in visit on 09/23/21  ?Lipid panel  ?Result Value Ref Range  ? Cholesterol 125 0 - 200 mg/dL  ? Triglycerides 168.0 (H) 0.0 - 149.0 mg/dL  ? HDL 44.30 >39.00 mg/dL  ? VLDL 33.6 0.0 - 40.0 mg/dL  ? LDL Cholesterol 47 0 - 99 mg/dL  ? Total CHOL/HDL Ratio 3   ? NonHDL 80.23   ? ? ?Assessment & Plan:  ? ?Problem List Items Addressed This Visit   ? ? Chronic migraine w/o aura w/o status migrainosus, not intractable  ?  Saw neurology, diagnosed with chronic migraines.  ?Actually headaches have significantly improved with healthy diet and lifestyle changes.  ?I did suggest she trial magnesium and riboflavin preventative supplementation as recommended by neurology. She will continue PRN ibuprofen for abortive therapy.  ?  ?  ? GERD (gastroesophageal reflux disease)  ?  Now managed with PRN PPI.  ?  ?  ? Allergic rhinitis  ?  Stable period on singulair and cetirizine  ?  ?  ? HLD (hyperlipidemia)  ?  Marked improvement on atorvastatin '40mg'$  along with healthy diet and lifestyle changes. She does note some myalgias, ?statin related - recommended she try CoQ10 suppelmentation ?The ASCVD Risk score (Arnett DK, et al., 2019) failed to calculate for the following reasons: ?  The valid total cholesterol range is 130 to 320 mg/dL  ?  ?  ? Asthma  ?  Stable period on daily singulair with PRN albuterol.  ?  ?  ?  ? ?Meds ordered this encounter  ?Medications  ? Riboflavin 400 MG TABS  ?  Sig: Take 1 tablet by mouth daily.  ?  Dispense:  30 tablet  ? Magnesium 400 MG TABS  ?  Sig: Take 1 tablet by mouth daily.  ? Coenzyme Q10 (CO Q-10) 100 MG CAPS  ?  Sig: Take 1 capsule by mouth daily.  ?  Refill:  0  ? ?No orders of the defined types were placed in this encounter. ? ? ?Patient Instructions  ?Busque supplemento riboflavina (vitamina B2) '400mg'$  diarios  igual que magnesium oxide 250-'400mg'$  diarios. El magnesio puede causar soltura/diarrhea.  ?Estos supplementos pueden ayudar a prevenir migra?as.  ?Use ibuprofeno '600mg'$  cuando tenga migra?a fuerte.  ?Para dolor de piernas puede tratar CoEnzyme Q10 suplemento '100mg'$  diarios sin receta. Tomelo por 1 mes,  si nota beneficio, siga tomando.  ?Cambie cita de 11/2021 a despues de 05/06/2022 para fisico.  ? ?Follow up plan: ?Return in about 7 months (around 05/07/2022) for annual exam, prior fasting for blood work. ? ?Ria Bush, MD   ?

## 2021-10-01 ENCOUNTER — Encounter: Payer: Self-pay | Admitting: Family Medicine

## 2021-10-01 NOTE — Assessment & Plan Note (Addendum)
Saw neurology, diagnosed with chronic migraines.  ?Actually headaches have significantly improved with healthy diet and lifestyle changes.  ?I did suggest she trial magnesium and riboflavin preventative supplementation as recommended by neurology. She will continue PRN ibuprofen for abortive therapy.  ?

## 2021-10-01 NOTE — Assessment & Plan Note (Addendum)
Now managed with PRN PPI.  ?

## 2021-10-01 NOTE — Assessment & Plan Note (Signed)
Stable period on daily singulair with PRN albuterol.  ?

## 2021-10-01 NOTE — Assessment & Plan Note (Signed)
Stable period on singulair and cetirizine  ?

## 2021-10-01 NOTE — Assessment & Plan Note (Signed)
Marked improvement on atorvastatin '40mg'$  along with healthy diet and lifestyle changes. She does note some myalgias, ?statin related - recommended she try CoQ10 suppelmentation ?The ASCVD Risk score (Arnett DK, et al., 2019) failed to calculate for the following reasons: ?  The valid total cholesterol range is 130 to 320 mg/dL  ?

## 2021-10-27 ENCOUNTER — Telehealth: Payer: Self-pay | Admitting: *Deleted

## 2021-10-27 ENCOUNTER — Encounter: Payer: Self-pay | Admitting: *Deleted

## 2021-10-27 DIAGNOSIS — Z79899 Other long term (current) drug therapy: Secondary | ICD-10-CM

## 2021-10-27 MED ORDER — DICYCLOMINE HCL 10 MG PO CAPS: 10.0000 mg | ORAL_CAPSULE | Freq: Two times a day (BID) | ORAL | 0 refills | Status: DC

## 2021-10-27 MED ORDER — ALBUTEROL SULFATE HFA 108 (90 BASE) MCG/ACT IN AERS: 1.0000 | INHALATION_SPRAY | RESPIRATORY_TRACT | 1 refills | Status: DC | PRN

## 2021-10-27 NOTE — Telephone Encounter (Signed)
Case discussed with RN Hildred Alamin Rx sent to patient pharmacy of choice electronically. ?

## 2021-10-27 NOTE — Telephone Encounter (Signed)
Pt in to clinic requesting refill of Albuterol inh and dicyclomine. Advised dicyclomine should come from pcp but bridge refill will be done for 30 day supply as pt reports being out and having abd cramps/stomach pain this weekend. Albuterol last ordered one year ago and uses prn for seasonal allergy flares. Does not need currently but wants to have on hand entering spring season.  ?

## 2021-11-03 ENCOUNTER — Ambulatory Visit: Payer: No Typology Code available for payment source | Admitting: Family Medicine

## 2021-11-06 NOTE — Telephone Encounter (Signed)
Patient contacted via telephone and stated picked up Rx feeling better and had no further questions or concerns. ?

## 2021-11-12 NOTE — Telephone Encounter (Signed)
Unable to see cardiology office visit notes in Epic. MRI completed in February ? ?IMPRESSION: ?No evidence of acute intracranial abnormality. ?  ?Mild multifocal T2 FLAIR hyperintense signal abnormality within the cerebral white matter. These signal changes are nonspecific and ?differential considerations include chronic small vessel ischemic disease, sequela of chronic migraine headaches, sequela of a prior ?infectious/inflammatory process or sequela of a demyelinating process (such as multiple sclerosis), among others. ?  ?Paranasal sinus disease, as described. ?  ?  ?Electronically Signed ?  By: Kellie Simmering D.O. ?  On: 08/11/2021 10:50 ?

## 2021-11-22 ENCOUNTER — Other Ambulatory Visit: Payer: Self-pay | Admitting: Registered Nurse

## 2021-11-25 ENCOUNTER — Encounter: Payer: Self-pay | Admitting: Registered Nurse

## 2021-11-25 NOTE — Telephone Encounter (Signed)
Patient contacted via telephone stated she does not need refill at this time.  Still has pills from last fill.  Response sent to pharmacy patient needs appt for next refill request and Rx refused at this time.

## 2021-12-02 ENCOUNTER — Encounter: Payer: No Typology Code available for payment source | Admitting: Family Medicine

## 2021-12-23 NOTE — Telephone Encounter (Signed)
Patient seen in clinic reported all results discussed with her at specialist office and no further questions or concerns at this time.

## 2022-01-26 ENCOUNTER — Ambulatory Visit: Payer: No Typology Code available for payment source | Admitting: Registered Nurse

## 2022-01-26 ENCOUNTER — Encounter: Payer: Self-pay | Admitting: Registered Nurse

## 2022-01-26 VITALS — BP 120/78 | HR 66 | Temp 98.2°F | Resp 16

## 2022-01-26 MED ORDER — BENZONATATE 200 MG PO CAPS
200.0000 mg | ORAL_CAPSULE | Freq: Three times a day (TID) | ORAL | 0 refills | Status: AC | PRN
Start: 2022-01-26 — End: 2022-02-05

## 2022-01-26 NOTE — Patient Instructions (Addendum)
Cough, Adult Coughing is a reflex that clears your throat and your airways (respiratory system). Coughing helps to heal and protect your lungs. It is normal to cough occasionally, but a cough that happens with other symptoms or lasts a long time may be a sign of a condition that needs treatment. An acute cough may only last 2-3 weeks, while a chronic cough may last 8 or more weeks. Coughing is commonly caused by: Infection of the respiratory systemby viruses or bacteria. Breathing in substances that irritate your lungs. Allergies. Asthma. Mucus that runs down the back of your throat (postnasal drip). Smoking. Acid backing up from the stomach into the esophagus (gastroesophageal reflux). Certain medicines. Chronic lung problems. Other medical conditions such as heart failure or a blood clot in the lung (pulmonary embolism). Follow these instructions at home: Medicines Take over-the-counter and prescription medicines only as told by your health care provider. Talk with your health care provider before you take a cough suppressant medicine. Lifestyle  Avoid cigarette smoke. Do not use any products that contain nicotine or tobacco, such as cigarettes, e-cigarettes, and chewing tobacco. If you need help quitting, ask your health care provider. Drink enough fluid to keep your urine pale yellow. Avoid caffeine. Do not drink alcohol if your health care provider tells you not to drink. General instructions  Pay close attention to changes in your cough. Tell your health care provider about them. Always cover your mouth when you cough. Avoid things that make you cough, such as perfume, candles, cleaning products, or campfire or tobacco smoke. If the air is dry, use a cool mist vaporizer or humidifier in your bedroom or your home to help loosen secretions. If your cough is worse at night, try to sleep in a semi-upright position. Rest as needed. Keep all follow-up visits as told by your health care  provider. This is important. Contact a health care provider if you: Have new symptoms. Cough up pus. Have a cough that does not get better after 2-3 weeks or gets worse. Cannot control your cough with cough suppressant medicines and you are losing sleep. Have pain that gets worse or pain that is not helped with medicine. Have a fever. Have unexplained weight loss. Have night sweats. Get help right away if: You cough up blood. You have difficulty breathing. Your heartbeat is very fast. These symptoms may represent a serious problem that is an emergency. Do not wait to see if the symptoms will go away. Get medical help right away. Call your local emergency services (911 in the [place] Do not drive yourself to the hospital. Summary Coughing is a reflex that clears your throat and your airways. It is normal to cough occasionally, but a cough that happens with other symptoms or lasts a long time may be a sign of a condition that needs treatment. Take over-the-counter and prescription medicines only as told by your health care provider. Always cover your mouth when you cough. Contact a health care provider if you have new symptoms or a cough that does not get better after 2-3 weeks or gets worse. This information is not intended to replace advice given to you by your health care provider. Make sure you discuss any questions you have with your health care provider. Document Revised: 07/10/2018 Document Reviewed: 07/10/2018 Elsevier Patient Education  [place]. Plantar Fasciitis Rehab Ask your health care provider which exercises are safe for you. Do exercises exactly as told by your health care provider and adjust them as  directed. It is normal to feel mild stretching, pulling, tightness, or discomfort as you do these exercises. Stop right away if you feel sudden pain or your pain gets worse. Do not begin these exercises until told by your health care provider. Stretching and  range-of-motion exercises These exercises warm up your muscles and joints and improve the movement and flexibility of your foot. These exercises also help to relieve pain. Plantar fascia stretch  Sit with your left / right leg crossed over your opposite knee. Hold your heel with one hand with that thumb near your arch. With your other hand, hold your toes and gently pull them back toward the top of your foot. You should feel a stretch on the base (bottom) of your toes, or the bottom of your foot (plantar fascia), or both. Hold this stretch for_____5-15_____ seconds. Slowly release your toes and return to the starting position. Repeat ____3______ times. Complete this exercise ___2_______ times a day. Gastrocnemius stretch, standing This exercise is also called a calf (gastroc) stretch. It stretches the muscles in the back of the upper calf. Stand with your hands against a wall. Extend your left / right leg behind you, and bend your front knee slightly. Keeping your heels on the floor, your toes facing forward, and your back knee straight, shift your weight toward the wall. Do not arch your back. You should feel a gentle stretch in your upper calf. Hold this position for ____5-15______ seconds. Repeat ___3_______ times. Complete this exercise _____2_____ times a day. Soleus stretch, standing This exercise is also called a calf (soleus) stretch. It stretches the muscles in the back of the lower calf. Stand with your hands against a wall. Extend your left / right leg behind you, and bend your front knee slightly. Keeping your heels on the floor and your toes facing forward, bend your back knee and shift your weight slightly over your back leg. You should feel a gentle stretch deep in your lower calf. Hold this position for __5-15________ seconds. Repeat ____3______ times. Complete this exercise ____2______ times a day. Gastroc and soleus stretch, standing step This exercise stretches the muscles  in the back of the lower leg. These muscles are in the upper calf (gastrocnemius) and the lower calf (soleus). Stand with the ball of your left / right foot on the front of a step. The ball of your foot is on the walking surface, right under your toes. Keep your other foot firmly on the same step. Hold on to the wall or a railing for balance. Slowly lift your other foot, allowing your body weight to press your heel down over the edge of the front of the step. Keep knee straight and unbent. You should feel a stretch in your calf. Hold this position for ___5-15_______ seconds. Return both feet to the step. Repeat this exercise with a slight bend in your left / right knee. Repeat ___3_______ times with your left / right knee straight and ____2______ times with your left / right knee bent. Complete this exercise _____2_____ times a day. Balance exercise This exercise builds your balance and strength control of your arch to help take pressure off your plantar fascia. Single leg stand If this exercise is too easy, you can try it with your eyes closed or while standing on a pillow. Without shoes, stand near a railing or in a doorway. You may hold on to the railing or door frame as needed. Stand on your left / right foot. Keep your big  toe down on the floor and lift the arch of your foot. You should feel a stretch across the bottom of your foot and your arch. Do not let your foot roll inward. Hold this position for ____5-15______ seconds. Repeat ____3______ times. Complete this exercise _____2_____ times a day. This information is not intended to replace advice given to you by your health care provider. Make sure you discuss any questions you have with your health care provider. Document Revised: 04/03/2020 Document Reviewed: 04/03/2020 Elsevier Patient Education  [place]. Plantar Fasciitis  Plantar fasciitis is a painful foot condition that affects the heel. It occurs when the band of  tissue that connects the toes to the heel bone (plantar fascia) becomes irritated. This can happen as the result of exercising too much or doing other repetitive activities (overuse injury). Plantar fasciitis can cause mild irritation to severe pain that makes it difficult to walk or move. The pain is usually worse in the morning after sleeping, or after sitting or lying down for a period of time. Pain may also be worse after long periods of walking or standing. What are the causes? This condition may be caused by: Standing for long periods of time. Wearing shoes that do not have good arch support. Doing activities that put stress on joints (high-impact activities). This includes ballet and exercise that makes your heart beat faster (aerobic exercise), such as running. Being overweight. An abnormal way of walking (gait). Tight muscles in the back of your lower leg (calf). High arches in your feet or flat feet. Starting a new athletic activity. What are the signs or symptoms? The main symptom of this condition is heel pain. Pain may get worse after the following: Taking the first steps after a time of rest, especially in the morning after awakening, or after you have been sitting or lying down for a while. Long periods of standing still. Pain may decrease after 30-45 minutes of activity, such as gentle walking. How is this diagnosed? This condition may be diagnosed based on your medical history, a physical exam, and your symptoms. Your health care provider will check for: A tender area on the bottom of your foot. A high arch in your foot or flat feet. Pain when you move your foot. Difficulty moving your foot. You may have imaging tests to confirm the diagnosis, such as: X-rays. Ultrasound. MRI. How is this treated? Treatment for plantar fasciitis depends on how severe your condition is. Treatment may include: Rest, ice, pressure (compression), and raising (elevating) the affected foot.  This is called RICE therapy. Your health care provider may recommend RICE therapy along with over-the-counter pain medicines to manage your pain. Exercises to stretch your calves and your plantar fascia. A splint that holds your foot in a stretched, upward position while you sleep (night splint). Physical therapy to relieve symptoms and prevent problems in the future. Injections of steroid medicine (cortisone) to relieve pain and inflammation. Stimulating your plantar fascia with electrical impulses (extracorporeal shock wave therapy). This is usually the last treatment option before surgery. Surgery, if other treatments have not worked after 12 months. Follow these instructions at home: Managing pain, stiffness, and swelling  If directed, put ice on the painful area. To do this: Put ice in a plastic bag, or use a frozen bottle of water. Place a towel between your skin and the bag or bottle. Roll the bottom of your foot over the bag or bottle. Do this for 20 minutes, 2-3 times a  day. Wear athletic shoes that have air-sole or gel-sole cushions, or try soft shoe inserts that are designed for plantar fasciitis. Elevate your foot above the level of your heart while you are sitting or lying down. Activity Avoid activities that cause pain. Ask your health care provider what activities are safe for you. Do physical therapy exercises and stretches as told by your health care provider. Try activities and forms of exercise that are easier on your joints (low impact). Examples include swimming, water aerobics, and biking. General instructions Take over-the-counter and prescription medicines only as told by your health care provider. Wear a night splint while sleeping, if told by your health care provider. Loosen the splint if your toes tingle, become numb, or turn cold and blue. Maintain a healthy weight, or work with your health care provider to lose weight as needed. Keep all follow-up visits. This  is important. Contact a health care provider if you have: Symptoms that do not go away with home treatment. Pain that gets worse. Pain that affects your ability to move or do daily activities. Summary Plantar fasciitis is a painful foot condition that affects the heel. It occurs when the band of tissue that connects the toes to the heel bone (plantar fascia) becomes irritated. Heel pain is the main symptom of this condition. It may get worse after exercising too much or standing still for a long time. Treatment varies, but it usually starts with rest, ice, pressure (compression), and raising (elevating) the affected foot. This is called RICE therapy. Over-the-counter medicines can also be used to manage pain. This information is not intended to replace advice given to you by your health care provider. Make sure you discuss any questions you have with your health care provider. Document Revised: 10/08/2019 Document Reviewed: 10/08/2019 Elsevier Patient Education  [place]. Patellar Tendinitis Rehab Ask your health care provider which exercises are safe for you. Do exercises exactly as told by your health care provider and adjust them as directed. It is normal to feel mild stretching, pulling, tightness, or discomfort as you do these exercises. Stop right away if you feel sudden pain or your pain gets worse. Do not begin these exercises until told by your health care provider. Stretching and range-of-motion exercise This exercise warms up your muscles and joints and improves the movement and flexibility of your knee. The exercise also helps to relieve pain and stiffness. Hamstring, doorway stretch This is an exercise in which you lie in a doorway and prop your leg on a wall to stretch the back of your knee and thigh (hamstring). Lie on your back in front of a doorway with your left / right leg resting against the wall and your other leg flat on the floor in the doorway. There should be a  slight bend in your left / right knee. Straighten your left / right knee. You should feel a stretch behind your knee or thigh. If you do not, scoot your buttocks closer to the door. Hold this position for ___5-15_______ seconds. Repeat ___3_______ times. Complete this exercise ____2______ times a day. Strengthening exercises These exercises build strength and endurance in your knee. Endurance is the ability to use your muscles for a long time, even after they get tired. Quadriceps, isometric This exercise stretches the muscles in front of your thigh (quadriceps) without moving your knee joint (isometric). Lie on your back with your left / right leg extended and your other knee bent. Slowly tense the muscles in the  front of your left / right thigh. When you do this, you should see your kneecap slide up toward your hip or see increased dimpling just above the knee. This motion will push the back of your knee toward the floor. If this is painful, try putting a rolled-up hand towel under your knee to support it in a bent position. Change the size of the towel to find a position that allows you to do this exercise without any pain. For ____5-15______ seconds, hold the muscle as tight as you can without increasing your pain. Relax the muscles slowly and completely. Repeat ____3______ times. Complete this exercise ____2______ times a day. Straight leg raises, flexors This exercise stretches the muscles in front of your thigh (quadriceps) and the muscles that move your hips (hip flexors). Lie on your back with your left / right leg extended and your other knee bent. Tense the muscles in the front of your left / right thigh. When you do this, you should see your kneecap slide up or see increased dimpling just above the knee. Keep these muscles tight as you raise your leg 4-6 inches (10-15 cm) off the floor. Do not let your moving knee bend. Hold this position for ____5-15______ seconds. Keep these muscles  tense as you slowly lower your leg. Relax your muscles slowly and completely. Repeat ___3_______ times. Complete this exercise ____2______ times a day. Squats This is a weight-bearing exercise in which you bend your knees and lower your hips while engaging your thigh muscles. Stand in front of a table, with your feet and knees pointing straight ahead. You may rest your hands on the table for balance but not for support. Slowly bend your knees and lower your hips like you are going to sit in a chair. Keep your weight over your heels, not over your toes. Keep your lower legs upright so they are parallel with the table legs. Do not let your hips go lower than your knees. Do not bend lower than told by your health care provider. If your knee pain increases, do not bend as low. Hold the squat position for ___5-15_______ seconds. Slowly push with your legs to return to standing. Do not use your hands to pull yourself to standing. Repeat _____3_____ times. Complete this exercise ____2______ times a day. Step-downs This is an exercise in which you step down slowly while engaging your leg muscles. Stand on the edge of a step. Keeping your weight over your left / right heel, slowly bend your left / right knee to bring your left / right heel toward the floor. Lower your heel as far as you can while keeping control and without increasing any discomfort. Do not let your left / right knee come forward. Use your leg muscles, not gravity, to lower your body. Hold on to a wall or rail for balance if needed. Slowly push through your heel to lift your body weight back up. Return to the starting position. Repeat ____3______ times. Complete this exercise ____2______ times a day. Straight leg raises, abductors This exercise strengthens the muscles that rotate the leg at the hip and move it away from your body (hip abductors). Lie on your side with your left / right leg in the top position. Lie so your head,  shoulder, knee, and hip line up. You may bend your bottom knee to help you keep your balance. Roll your hips slightly forward, so that your hips are stacked directly over each other and your left / right  knee is facing forward. Leading with your heel, lift your top leg 4-6 inches (10-15 cm). You should feel the muscles in your outer hip lifting. Do not let your foot drift forward. Do not let your knee roll toward the ceiling. Hold this position for _____5-15_____ seconds. Slowly lower your leg to the starting position. Let your muscles relax completely after each repetition. Repeat _____3_____ times. Complete this exercise ____2______ times a day. This information is not intended to replace advice given to you by your health care provider. Make sure you discuss any questions you have with your health care provider. Document Revised: 01/27/2021 Document Reviewed: 01/27/2021 Elsevier Patient Education  [place]. Patellar Tendinitis  Patellar tendinitis is also called jumper's knee or patellar tendinopathy. This condition happens when there is damage to the patellar tendon. Tendons are cord-like tissues that connect muscles to bones. The patellar tendon connects the bottom of the kneecap (patella) to the top of the shin bone (tibia). Patellar tendinitis causes pain in the front of the knee. The condition is classified into the following stages: Stage 1: You have pain only after activity. Stage 2: You have pain during and after activity. Stage 3: You have pain at rest as well as during and after activity. The pain limits your ability to do the activity. Stage 4: You have tendon tears. The tears severely limit your activity. What are the causes? This condition is caused by repeated (repetitive) stress on the tendon. This stress may cause the tendon to stretch, swell, thicken, or tear. What increases the risk? The following factors may make you more likely to develop this  condition: Participating in sports that involve running, kicking, and jumping, especially on hard surfaces. These include: Basketball. Volleyball. Soccer. Track and field. Training too hard. Having tight thigh muscles. Having received steroid injections in the tendon. Having had knee surgery. Being 68-75 years old. Having rheumatoid arthritis, diabetes, or kidney disease. These conditions interrupt blood flow to the knee, causing the tendon to weaken. What are the signs or symptoms? The main symptom of this condition is pain and swelling in the front of the knee. The pain usually starts slowly and gradually gets worse. It may become painful to straighten your leg. The pain may get worse when you walk, run, or jump. How is this diagnosed? This condition may be diagnosed based on: Your symptoms. Your medical history. A physical exam. During the physical exam, your health care provider may check for: Tenderness along the tendon just below the patella. Tightness in your thigh muscles. Pain when you straighten your knee. Imaging tests, including: X-rays. These will show the position and condition of your patella. An MRI. This will show any abnormality of the tendon. Ultrasound. This will show any swelling or other abnormalities of the tendon. How is this treated? Treatment for this condition depends on the stage of the condition. It may involve: Avoiding activities that cause pain, such as jumping. Icing and elevating your knee. Having sound wave stimulation to promote healing. Doing physical therapy exercises to improve movement and strength in your knee when pain and swelling improve. Wearing a knee brace. This may be needed if your condition does not improve with treatment. Using crutches or a walker. This may be needed if your condition does not improve with treatment. Surgery. This may be done if you have stage 4 tendinitis. Follow these instructions at home: If you have a  removable brace: Wear the brace as told by your health care provider.  Remove it only as told by your health care provider. Check the skin around the brace every day. Tell your health care provider about any concerns. Loosen the brace if your toes tingle, become numb, or turn cold and blue. Keep the brace clean. If the brace is not waterproof: Do not let it get wet. Cover it with a watertight covering when you take a bath or shower. Ask your health care provider when it is safe for you to drive. Managing pain, stiffness, and swelling  If directed, put ice on the injured area. To do this: If you have a removable brace, remove it as told by your health care provider. Put ice in a plastic bag. Place a towel between your skin and the bag. Leave the ice on for 20 minutes, 2-3 times a day. Remove the ice if your skin turns bright red. This is very important. If you cannot feel pain, heat, or cold, you have a greater risk of damage to the area. Move your toes often to reduce stiffness and swelling. Raise (elevate) your knee above the level of your heart while you are sitting or lying down. Activity Do not use the injured limb to support your body weight until your health care provider says that you can. Use your crutches or a walker as told by your health care provider. Do exercises as told by your health care provider or physical therapist. Return to your normal activities as told by your health care provider. Ask your health care provider what activities are safe for you. General instructions Take over-the-counter and prescription medicines only as told by your health care provider. Do not use any products that contain nicotine or tobacco. These products include cigarettes, chewing tobacco, and vaping devices, such as e-cigarettes. These can delay healing. If you need help quitting, ask your health care provider. Keep all follow-up visits. This is important. How is this prevented? Warm up and  stretch before being active. Cool down and stretch after being active. Give your body time to rest between periods of activity. You may need to reduce how often you play a sport that requires frequent jumping. Make sure to use equipment that fits you. Be safe and responsible while being active. This will help you avoid falls which can damage the tendon. Do at least 150 minutes of moderate-intensity exercise each week, such as brisk walking or water aerobics. Maintain physical fitness, including: Strength. Flexibility. Cardiovascular fitness. Endurance. Contact a health care provider if: Your symptoms have not improved in 6 weeks. Your symptoms get worse. Summary Patellar tendinitis is also called jumper's knee or patellar tendinopathy. This condition happens when there is damage to the patellar tendon. Treatment for this condition depends on the stage of the condition and may include rest, ice, exercises, a knee brace, and surgery. Do not use the injured limb to support your body weight until your health care provider says that you can. Take over-the-counter and prescription medicines only as told by your health care provider. This information is not intended to replace advice given to you by your health care provider. Make sure you discuss any questions you have with your health care provider. Document Revised: 01/27/2021 Document Reviewed: 01/27/2021 Elsevier Patient Education  [place].
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
## 2022-02-04 ENCOUNTER — Telehealth: Payer: Self-pay | Admitting: Registered Nurse

## 2022-02-04 ENCOUNTER — Encounter: Payer: Self-pay | Admitting: Registered Nurse

## 2022-02-04 DIAGNOSIS — M722 Plantar fascial fibromatosis: Secondary | ICD-10-CM

## 2022-02-04 NOTE — Telephone Encounter (Signed)
Patient brought note from her chiropractor to clinic today.  Weakness when trying to get off the couch and worsening back pain.  Had chiropractic treatment.  Returned crutches she had borrowed from clinic today.  At work today in [employer].  She brought copy of work note to [employer] and wanted clinic to have copy also of her scheduled appts.  See paper record at [facility].

## 2022-02-26 DIAGNOSIS — M722 Plantar fascial fibromatosis: Secondary | ICD-10-CM | POA: Insufficient documentation

## 2022-03-02 NOTE — Telephone Encounter (Signed)
Patient seen in workcenter.  Stated back pain improved off all restrictions but plantar fasciitis right especially flared up.  Trying new shoes/inserts/stretching/ice.  Sometimes pain is radiating from heel up calf to back of knee also.  Patient stated she had appt with orthopedics and following recommendations for exercises/stretching/ice/shoes/inserts.  Discussed with patient may be trying too forceful of gastrocmemius stretch demonstrated how to decrease force/intensity of stretch by width of legs/positioning.  Standing versus seated hamstring/hip/knee stretches also along with straight leg raise.  Patient verbalized understanding information/instructions, repeated demonstrated stretches and will follow up if no improvement/worsening of symptoms.  Patient wearing toms slip on shoes with hard orthotic inserts.  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

## 2022-03-11 ENCOUNTER — Ambulatory Visit: Payer: No Typology Code available for payment source

## 2022-03-11 DIAGNOSIS — J301 Allergic rhinitis due to pollen: Secondary | ICD-10-CM

## 2022-03-11 NOTE — Patient Instructions (Signed)
Patient complained of allergies.  Loratine packet given to patient.

## 2022-04-08 ENCOUNTER — Encounter: Payer: Self-pay | Admitting: Registered Nurse

## 2022-04-08 ENCOUNTER — Ambulatory Visit: Payer: No Typology Code available for payment source | Admitting: Registered Nurse

## 2022-04-08 ENCOUNTER — Ambulatory Visit
Admission: RE | Admit: 2022-04-08 | Discharge: 2022-04-08 | Disposition: A | Discharge status: Home / Self Care | Payer: No Typology Code available for payment source | Attending: Registered Nurse | Admitting: Registered Nurse

## 2022-04-08 ENCOUNTER — Ambulatory Visit
Admission: RE | Admit: 2022-04-08 | Discharge: 2022-04-08 | Disposition: A | Discharge status: Home / Self Care | Payer: No Typology Code available for payment source | Source: Ambulatory Visit | Attending: Registered Nurse | Admitting: Registered Nurse

## 2022-04-08 VITALS — BP 109/89 | HR 93 | Temp 98.2°F

## 2022-04-08 DIAGNOSIS — R0781 Pleurodynia: Secondary | ICD-10-CM

## 2022-04-08 MED ORDER — IBUPROFEN 800 MG PO TABS: 800.0000 mg | ORAL_TABLET | Freq: Three times a day (TID) | ORAL | 0 refills | Status: DC

## 2022-04-08 MED ORDER — ACETAMINOPHEN 500 MG PO TABS: 1000.0000 mg | ORAL_TABLET | Freq: Four times a day (QID) | ORAL | 0 refills | Status: AC | PRN

## 2022-04-08 NOTE — Patient Instructions (Signed)
Chest Wall Pain Chest wall pain is pain in or around the bones and muscles of your chest. Sometimes, an injury causes this pain. Excessive coughing or overuse of arm and chest muscles may also cause chest wall pain. Sometimes, the cause may not be known. This pain may take several weeks or longer to get better. Follow these instructions at home: Managing pain, stiffness, and swelling  If directed, put ice on the painful area: Put ice in a plastic bag. Place a towel between your skin and the bag. Leave the ice on for 20 minutes, 2-3 times per day. Activity Rest as told by your health care provider. Avoid activities that cause pain. These include any activities that use your chest muscles or your abdominal and side muscles to lift heavy items. Ask your health care provider what activities are safe for you. General instructions  Take over-the-counter and prescription medicines only as told by your health care provider. Do not use any products that contain nicotine or tobacco, such as cigarettes, e-cigarettes, and chewing tobacco. These can delay healing after injury. If you need help quitting, ask your health care provider. Keep all follow-up visits as told by your health care provider. This is important. Contact a health care provider if: You have a fever. Your chest pain becomes worse. You have new symptoms. Get help right away if: You have nausea or vomiting. You feel sweaty or light-headed. You have a cough with mucus from your lungs (sputum) or you cough up blood. You develop shortness of breath. These symptoms may represent a serious problem that is an emergency. Do not wait to see if the symptoms will go away. Get medical help right away. Call your local emergency services (911 in the [place] Do not drive yourself to the hospital. Summary Chest wall pain is pain in or around the bones and muscles of your chest. Depending on the cause, it may be treated with ice, rest, medicines, and  avoiding activities that cause pain. Contact a health care provider if you have a fever, worsening chest pain, or new symptoms. Get help right away if you feel light-headed or you develop shortness of breath. These symptoms may be an emergency. This information is not intended to replace advice given to you by your health care provider. Make sure you discuss any questions you have with your health care provider. Document Revised: 09/05/2020 Document Reviewed: 09/05/2020 Elsevier Patient Education  2023 Elsevier Inc.  
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
## 2022-04-09 ENCOUNTER — Telehealth: Payer: Self-pay | Admitting: Registered Nurse

## 2022-04-09 DIAGNOSIS — R0781 Pleurodynia: Secondary | ICD-10-CM

## 2022-04-09 NOTE — Telephone Encounter (Signed)
Notified patient I am still waiting on results from radiologist.  Viewed images but not clear as I have small laptop screen and overlying breast tissue.  Awaiting radiologist review.  Patient stated pain slightly improved today.  Denied dyspnea/hemoptysis.  Discussed rest this weekend.  ER if dyspnea/hemoptysis/shortness of breath for re-evaluation.  Discussed I was told results will be to me on Monday per [facility] Radiology staff (I called office today).  Patient spoke full sentences without difficulty respirations even and unlabored no cough/wheezing/shortness of breath audible.  Patient verbalized understanding information/instructions, agreed with plan of care and had no further questions at this time.

## 2022-04-15 ENCOUNTER — Ambulatory Visit: Payer: No Typology Code available for payment source | Admitting: Registered Nurse

## 2022-04-15 VITALS — HR 70 | Resp 16

## 2022-04-15 DIAGNOSIS — E559 Vitamin D deficiency, unspecified: Secondary | ICD-10-CM

## 2022-04-15 DIAGNOSIS — R071 Chest pain on breathing: Secondary | ICD-10-CM

## 2022-04-15 DIAGNOSIS — R079 Chest pain, unspecified: Secondary | ICD-10-CM

## 2022-04-15 DIAGNOSIS — R0781 Pleurodynia: Secondary | ICD-10-CM

## 2022-04-15 NOTE — Progress Notes (Signed)
Subjective:    Patient ID: Isabella Robertson, female    DOB: 1965/10/14, 56 y.o.   MRN: [id number]  56y/o hispanic female established married patient here for re-evaluation left rib pain and upper chest wall pain with palpation refers back to rib/triggers pain.  Denied worsening with putting on shoes/head down positions.  Worst pain is with palpating affected rib/deep breathing.  Intermittent sharp pain.  Rests after work when at home.  Denied fever/chills/cough/shortness of breath/hemoptysis/productive cough/wheezing/rash/bruising/swelling torso.  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  Doesn't typically take pain medication as it upsets her stomach.      Review of Systems  Constitutional:  Negative for activity change, appetite change, chills, diaphoresis, fatigue and fever.  HENT:  Negative for congestion, postnasal drip, rhinorrhea, sinus pressure, sinus pain, trouble swallowing and voice change.   Eyes:  Negative for photophobia and visual disturbance.  Respiratory:  Negative for cough, choking, chest tightness, shortness of breath, wheezing and stridor.   Cardiovascular:  Positive for chest pain. Negative for palpitations and leg swelling.  Gastrointestinal:  Negative for abdominal pain, diarrhea and nausea.  Endocrine: Negative for cold intolerance and heat intolerance.  Genitourinary:  Negative for difficulty urinating.  Musculoskeletal:  Positive for myalgias. Negative for back pain, gait problem, neck pain and neck stiffness.  Skin:  Negative for pallor, rash and wound.  Allergic/Immunologic: Positive for food allergies.  Neurological:  Negative for dizziness, tremors, seizures, syncope, facial asymmetry, speech difficulty, weakness, light-headedness, numbness and headaches.  Hematological:  Negative for adenopathy. Does not bruise/bleed easily.  Psychiatric/Behavioral:  Negative for agitation,  confusion and sleep disturbance.        Objective:   Physical Exam Vitals reviewed.  Constitutional:      General: She is awake. She is not in acute distress.    Appearance: Normal appearance. She is well-developed, well-groomed and normal weight. She is not ill-appearing, toxic-appearing or diaphoretic.     Interventions: She is not intubated. HENT:     Head: Normocephalic and atraumatic.     Jaw: There is normal jaw occlusion.     Salivary Glands: Right salivary gland is not diffusely enlarged. Left salivary gland is not diffusely enlarged.     Right Ear: Hearing and external ear normal.     Left Ear: Hearing and external ear normal.     Nose: Nose normal. No nasal deformity, signs of injury, laceration, mucosal edema, congestion or rhinorrhea.     Mouth/Throat:     Lips: Pink. No lesions.     Mouth: Mucous membranes are moist. No oral lesions or angioedema.     Dentition: No gum lesions.     Tongue: No lesions. Tongue does not deviate from midline.     Palate: No mass and lesions.     Pharynx: Oropharynx is clear. Uvula midline. No pharyngeal swelling, posterior oropharyngeal erythema or uvula swelling.     Tonsils: No tonsillar exudate.  Eyes:     General: Lids are normal. Vision grossly intact. Gaze aligned appropriately. No allergic shiner or scleral icterus.       Right eye: No discharge.        Left eye: No discharge.     Extraocular Movements: Extraocular movements intact.     Conjunctiva/sclera: Conjunctivae normal.     Pupils: Pupils are equal, round, and reactive to light.  Neck:     Trachea: Trachea and phonation normal. No tracheal  deviation.  Cardiovascular:     Rate and Rhythm: Normal rate and regular rhythm.     Pulses: Normal pulses.          Radial pulses are 2+ on the right side and 2+ on the left side.     Heart sounds: Normal heart sounds, S1 normal and S2 normal. Heart sounds not distant. No murmur heard. Pulmonary:     Effort: Pulmonary effort is  normal. No accessory muscle usage, prolonged expiration, respiratory distress or retractions. She is not intubated.     Breath sounds: Normal breath sounds and air entry. No stridor or transmitted upper airway sounds. No decreased breath sounds, wheezing, rhonchi or rales.     Comments: Spoke full sentences without difficulty; no cough observed in exam room Chest:     Chest wall: Tenderness present. No mass, lacerations, deformity, swelling, crepitus or edema.       Comments: TTP continues over rib left at bra line; intercostal muscle palpation patient denied pain; pectoral muscle palpation worsens rib pain; patient spoke full sentences without difficulty; no defect/step off/crepitus/erythema/bruising/edema/flail chest Abdominal:     General: Abdomen is flat.  Musculoskeletal:        General: Normal range of motion.     Right shoulder: No deformity, laceration or crepitus. Normal range of motion. Normal strength.     Left shoulder: No deformity, laceration or crepitus. Normal range of motion. Normal strength.     Right hand: Normal strength. Normal capillary refill.     Left hand: Normal strength. Normal capillary refill.     Cervical back: Normal range of motion and neck supple. No swelling, edema, deformity, erythema, signs of trauma, lacerations, rigidity, torticollis, tenderness or crepitus. No pain with movement. Normal range of motion.     Thoracic back: No swelling, edema, deformity, signs of trauma, lacerations, spasms, tenderness or bony tenderness. Normal range of motion.     Lumbar back: No swelling, edema, deformity, signs of trauma, lacerations or tenderness. Normal range of motion.     Right lower leg: No edema.     Left lower leg: No edema.  Lymphadenopathy:     Head:     Right side of head: No submandibular or preauricular adenopathy.     Left side of head: No submandibular or preauricular adenopathy.     Cervical: No cervical adenopathy.     Right cervical: No superficial  cervical adenopathy.    Left cervical: No superficial cervical adenopathy.  Skin:    General: Skin is warm and dry.     Capillary Refill: Capillary refill takes less than 2 seconds.     Coloration: Skin is not ashen, cyanotic, jaundiced, mottled, pale or sallow.     Findings: No abrasion, abscess, acne, bruising, burn, ecchymosis, erythema, signs of injury, laceration, lesion, petechiae, rash or wound.     Nails: There is no clubbing.  Neurological:     General: No focal deficit present.     Mental Status: She is alert and oriented to person, place, and time. Mental status is at baseline.     GCS: GCS eye subscore is 4. GCS verbal subscore is 5. GCS motor subscore is 6.     Cranial Nerves: Cranial nerves 2-12 are intact. No cranial nerve deficit, dysarthria or facial asymmetry.     Sensory: Sensation is intact.     Motor: Motor function is intact. No weakness, tremor, atrophy, abnormal muscle tone or seizure activity.     Coordination: Coordination is intact.  Coordination normal.     Gait: Gait is intact. Gait normal.     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        Mood and Affect: Mood and affect normal.        Speech: Speech normal.        Behavior: Behavior normal. Behavior is cooperative.        Thought Content: Thought content normal.        Cognition and Memory: Cognition and memory normal.        Judgment: Judgment normal.     No crepitus with palpation chest wall; patient reported with supraclavicular palpation worsens pain left anterior rib distal to bra line xrayed last week.  No step off deformity of ribs or flail chest.  Patient spoke full sentences without difficulty.  No cough observed in clinic.      Assessment & Plan:   A-pain of  anterior chest wall with respiration and left rib pain subsequent visit, vitamin D deficiency  P-consider repeat rib series and chest xray next week discussed if very small fracture sometimes not seen until healing on imaging in 7-10 days.  DDx: pleurisy/chondritis/intercostal muscle strain  May take tylenol '1000mg'$  po q6h prn pain or ibuprofen '800mg'$  po TID prn pain with omeprazole/take with food.  ER precautions reinforced e.g. dyspnea/hemoptysis/worsening chest pain.  Pain not worsening but has changed from only rib pinpoint last week to upper chest nonspecific with deep inhalation now.  BBS CTA sp02 stable rib series last week negative for fracture/dislocation. [employer] handouts on pleurisy/costochondritis/chest wall pain. Patient verbalized understanding information/instructions/agreed with plan of care and had no further questions at this time.  Noted on chart review has not had vitamin D recheck/history of deficiency ordered to complete with 2024 [facility] labs this winter.

## 2022-04-17 ENCOUNTER — Encounter: Payer: Self-pay | Admitting: Registered Nurse

## 2022-04-17 MED ORDER — ACETAMINOPHEN 500 MG PO TABS: 1000.0000 mg | ORAL_TABLET | Freq: Four times a day (QID) | ORAL | 0 refills | Status: AC | PRN

## 2022-04-17 NOTE — Telephone Encounter (Signed)
Patient seen in clinic 04/15/22 and was notified of xray results normal via my chart once available.  Still having symptoms follow up with patient/re-evaluation again week of 04/20/22 clinic

## 2022-04-17 NOTE — Patient Instructions (Signed)
Chest Wall Pain Chest wall pain is pain in or around the bones and muscles of your chest. Sometimes, an injury causes this pain. Excessive coughing or overuse of arm and chest muscles may also cause chest wall pain. Sometimes, the cause may not be known. This pain may take several weeks or longer to get better. Follow these instructions at home: Managing pain, stiffness, and swelling  If directed, put ice on the painful area: Put ice in a plastic bag. Place a towel between your skin and the bag. Leave the ice on for 20 minutes, 2-3 times per day. Activity Rest as told by your health care provider. Avoid activities that cause pain. These include any activities that use your chest muscles or your abdominal and side muscles to lift heavy items. Ask your health care provider what activities are safe for you. General instructions  Take over-the-counter and prescription medicines only as told by your health care provider. Do not use any products that contain nicotine or tobacco, such as cigarettes, e-cigarettes, and chewing tobacco. These can delay healing after injury. If you need help quitting, ask your health care provider. Keep all follow-up visits as told by your health care provider. This is important. Contact a health care provider if: You have a fever. Your chest pain becomes worse. You have new symptoms. Get help right away if: You have nausea or vomiting. You feel sweaty or light-headed. You have a cough with mucus from your lungs (sputum) or you cough up blood. You develop shortness of breath. These symptoms may represent a serious problem that is an emergency. Do not wait to see if the symptoms will go away. Get medical help right away. Call your local emergency services (911 in the [place] Do not drive yourself to the hospital. Summary Chest wall pain is pain in or around the bones and muscles of your chest. Depending on the cause, it may be treated with ice, rest, medicines, and  avoiding activities that cause pain. Contact a health care provider if you have a fever, worsening chest pain, or new symptoms. Get help right away if you feel light-headed or you develop shortness of breath. These symptoms may be an emergency. This information is not intended to replace advice given to you by your health care provider. Make sure you discuss any questions you have with your health care provider. Document Revised: 09/05/2020 Document Reviewed: 09/05/2020 Elsevier Patient Education  [place] is irritation and inflammation of the linings of the lungs (pleura). Pleura cover the outside of the lungs and the inside of the chest wall. Normally, there is a small amount of fluid (pleural fluid) between the pleura that allows the lungs to move in and out smoothly when you breathe. Pleurisy can cause the pleura to be rough and dry and rub together when breathing, making it difficult to breathe or cough. In some cases, pleurisy can be associated with a buildup of fluid between the pleura (pleural effusion). Pleurisy is also called pleuritis. What are the causes? Common causes of this condition include: A lung infection caused by bacteria or a virus. A blood clot that travels to the lungs (pulmonary embolism). Air leaking into the pleural space (pneumothorax). This can happen due to injury or trauma to the chest. Lung cancer or a lung tumor. Heart or chest surgery. Lung damage from inhaling asbestos. A lung reaction to certain medicines, or treatments for cancer, such as chemotherapy or radiation therapy to the chest. Diseases that  can cause lung inflammation. These include rheumatoid arthritis, lupus, sickle cell disease, inflammatory bowel disease, and pancreatitis. Sometimes, the cause of this condition is not known. What are the signs or symptoms? The main symptom of this condition is chest pain. The pain is usually on one side. Chest pain may start  suddenly and be sharp or stabbing. It may become a constant dull ache. You may also feel pain in your back or shoulder. The pain may get worse when you cough, take deep breaths, or make sudden movements. Other symptoms may include: Shortness of breath. Noisy breathing (wheezing or rattling). Cough. Chills. Fever. Coughing up blood (hemoptysis) or yellowish mucus from your lungs (sputum). Symptoms can be worse with certain positions, such as when lying down or lying to one side. Signs and symptoms of pleurisy may be very similar to the signs and symptoms of a heart attack or inflammation of the heart (pericarditis). How is this diagnosed? This condition may be diagnosed based on: Your medical history, especially if you have heart or lung disease. Your symptoms. A physical exam. Your health care provider will listen to your breathing with a stethoscope to check for a rough, rubbing sound (friction rub) when you breathe. Your breath sounds may be muffled and decreased on the affected side. Tests. You may have: Blood tests to check for infections or diseases and to measure the oxygen in your blood. An EKG to look at your heart rhythm. Imaging tests of your lungs. These may include a chest X-ray, ultrasound, an MRI, or a CT scan. A procedure using a needle to remove pleural fluid for testing (thoracentesis). How is this treated? Treatment for this condition depends on the cause. Pleurisy that was caused by a virus usually clears up within 2 weeks. Treatment for pleurisy may include: NSAIDs, such as ibuprofen, to help relieve pain and inflammation. Antibiotic medicines, if your condition was caused by a bacterial infection. Prescription pain medicine or cough medicine. Blood-thinning (anticoagulant) medicines to treat blood clots, if your condition was caused by pulmonary embolism. Removal of pleural fluid (thoracentesis) or air, using a chest tube to vacuum fluid or air from the pleural  space. Follow these instructions at home: Medicines Take over-the-counter and prescription medicines only as told by your health care provider. If you were prescribed an antibiotic, take it as told by your health care provider. Do not stop taking the antibiotic even if you start to feel better. If you were prescribed medicines to remove extra fluid from your lungs (diuretics), take them as told by your health care provider. If you were prescribed an anticoagulant, take it exactly as told by your health care provider. This is important. Activity Rest and return to your normal activities as told by your health care provider. Ask your health care provider what activities are safe for you. Ask your health care provider if the medicine prescribed to you requires you to avoid driving or using machinery. General instructions  Watch for any changes in your condition. Take deep breaths often, even if it is painful. This can help prevent lung infection (pneumonia) and collapse of lung tissue (atelectasis). You may be given a medical device (incentive spirometer) to help exercise your lungs and breathing, to prevent lung complications. Do not use any products that contain nicotine or tobacco, such as cigarettes, e-cigarettes, and chewing tobacco. If you need help quitting, ask your health care provider. Keep all follow-up visits as told by your health care provider. This is important. Contact a  health care provider if: You have pain that: Gets worse or more frequent. Does not get better with medicines you were prescribed. You have a fever or chills. Your cough or shortness of breath is not improving or getting worse. You cough up pus-like (purulent) fluid. Get help right away if: You cough up blood. You have any of the following symptoms that get worse: Difficulty breathing with activity, especially minimal activity. Shortness of breath. Wheezing. You have pain that spreads into your neck, arms,  or jaw. You feel dizzy or faint. These symptoms may represent a serious problem that is an emergency. Do not wait to see if the symptoms will go away. Get medical help right away. Call your local emergency services (911 in the [place] Do not drive yourself to the hospital. Summary Pleurisy is inflammation of the linings of the lungs. Pleurisy causes pain that makes it difficult for you to breathe or cough. Do not use any products that contain nicotine or tobacco, such as cigarettes, e-cigarettes, and chewing tobacco. If you need help quitting, ask your health care provider. Rest and return to your normal activities as told by your health care provider. Ask your health care provider what activities are safe for you. Keep all follow-up visits as told by your health care provider. This is important. This information is not intended to replace advice given to you by your health care provider. Make sure you discuss any questions you have with your health care provider. Document Revised: 10/13/2021 Document Reviewed: 07/26/2019 Elsevier Patient Education  [place]. Costochondritis  Costochondritis is inflammation of the tissue (cartilage) that connects the ribs to the breastbone (sternum). This causes pain in the front of the chest. The pain usually starts slowly and involves more than one rib. What are the causes? The exact cause of this condition is not always known. It results from stress on the cartilage where your ribs attach to your sternum. The cause of this stress could be: Chest injury. Exercise or activity, such as lifting. Severe coughing. What increases the risk? You are more likely to develop this condition if you: Are female. Are 80-37 years old. Recently started a new exercise or work activity. Have low levels of vitamin D. Have a condition that makes you cough frequently. What are the signs or symptoms? The main symptom of this condition is chest pain. The pain: Usually  starts gradually and can be sharp or dull. Gets worse with deep breathing, coughing, or exercise. Gets better with rest. May be worse when you press on the affected area of your ribs and sternum. How is this diagnosed? This condition is diagnosed based on your symptoms, your medical history, and a physical exam. Your health care provider will check for pain when pressing on your sternum. You may also have tests to rule out other causes of chest pain. These may include: A chest X-ray to check for lung problems. An ECG (electrocardiogram) to see if you have a heart problem that could be causing the pain. An imaging scan to rule out a chest or rib fracture. How is this treated? This condition usually goes away on its own over time. Your health care provider may prescribe an NSAID, such as ibuprofen, to reduce pain and inflammation. Treatment may also include: Resting and avoiding activities that make pain worse. Applying heat or ice to the area to reduce pain and inflammation. Doing exercises to stretch your chest muscles. If these treatments do not help, your health care provider  may inject a numbing medicine at the sternum-rib connection to help relieve the pain. Follow these instructions at home: Managing pain, stiffness, and swelling     If directed, put ice on the painful area. To do this: Put ice in a plastic bag. Place a towel between your skin and the bag. Leave the ice on for 20 minutes, 2-3 times a day. If directed, apply heat to the affected area as often as told by your health care provider. Use the heat source that your health care provider recommends, such as a moist heat pack or a heating pad. Place a towel between your skin and the heat source. Leave the heat on for 20-30 minutes. Remove the heat if your skin turns bright red. This is especially important if you are unable to feel pain, heat, or cold. You may have a greater risk of getting burned. Activity Rest as told by  your health care provider. Avoid activities that make pain worse. This includes any activities that use chest, abdominal, and side muscles. Do not lift anything that is heavier than 10 lb (4.5 kg), or the limit that you are told, until your health care provider says that it is safe. Return to your normal activities as told by your health care provider. Ask your health care provider what activities are safe for you. General instructions Take over-the-counter and prescription medicines only as told by your health care provider. Keep all follow-up visits as told by your health care provider. This is important. Contact a health care provider if: You have chills or a fever. Your pain does not go away or it gets worse. You have a cough that does not go away. Get help right away if: You have shortness of breath. You have severe chest pain that is not relieved by medicines, heat, or ice. These symptoms may represent a serious problem that is an emergency. Do not wait to see if the symptoms will go away. Get medical help right away. Call your local emergency services (911 in the [place] Do not drive yourself to the hospital.  Summary Costochondritis is inflammation of the tissue (cartilage) that connects the ribs to the breastbone (sternum). This condition causes pain in the front of the chest. Costochondritis results from stress on the cartilage where your ribs attach to your sternum. Treatment may include medicines, rest, heat or ice, and exercises. This information is not intended to replace advice given to you by your health care provider. Make sure you discuss any questions you have with your health care provider. Document Revised: 09/08/2021 Document Reviewed: 05/04/2019 Elsevier Patient Education  [place].

## 2022-04-19 NOTE — Telephone Encounter (Signed)
Spoke with patient via telephone stated pain has remained the same, resting this weekend, denied new symptoms.  Has tried OTC pain medication.  Denied dyspnea/hemoptysis/worsening chest pain.  Reminded patient I am onsite again Tuesday 17 Oct.  Patient A&Ox3 spoke full sentences without difficulty no audible wheezing/cough/shortness of breath during 2 minute telephone call.  Patient verbalized understanding information/instructions, agreed with plan of care and had no further questions at this time.

## 2022-04-20 ENCOUNTER — Ambulatory Visit: Payer: No Typology Code available for payment source | Admitting: Registered Nurse

## 2022-04-20 VITALS — HR 76 | Resp 16

## 2022-04-20 DIAGNOSIS — R0781 Pleurodynia: Secondary | ICD-10-CM

## 2022-04-21 ENCOUNTER — Encounter: Payer: Self-pay | Admitting: Registered Nurse

## 2022-04-21 NOTE — Progress Notes (Signed)
Subjective 56y/o hispanic female established married patient here for re-evaluation left rib pain.  Last visit 12 Oct.  Upper chest wall pain with palpation resolved.  Rested all weekend until work yesterday.  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  Most painful is putting on seatbelt.  Denied worsening with putting on shoes/head down positions.  Rests after work when at home.  Denied fever/chills/cough/shortness of breath/hemoptysis/productive cough/wheezing/rash/bruising/swelling torso. Doesn't typically take pain medication as it upsets her stomach.           Review of Systems  Constitutional:  Negative for activity change, appetite change, chills, diaphoresis, fatigue and fever.  HENT:  Negative for congestion, postnasal drip, rhinorrhea, sinus pressure, sinus pain, trouble swallowing and voice change.   Eyes:  Negative for photophobia and visual disturbance.  Respiratory:  Negative for cough, choking, chest tightness, shortness of breath, wheezing and stridor.   Cardiovascular:  Positive for chest pain. Negative for palpitations and leg swelling.  Gastrointestinal:  Negative for abdominal pain, diarrhea and nausea.  Endocrine: Negative for cold intolerance and heat intolerance.  Genitourinary:  Negative for difficulty urinating.  Musculoskeletal:  Positive for myalgias. Negative for back pain, gait problem, neck pain and neck stiffness.  Skin:  Negative for pallor, rash and wound.  Allergic/Immunologic: Positive for food allergies.  Neurological:  Negative for dizziness, tremors, seizures, syncope, facial asymmetry, speech difficulty, weakness, light-headedness, numbness and headaches.  Hematological:  Negative for adenopathy. Does not bruise/bleed easily.  Psychiatric/Behavioral:  Negative for agitation, confusion and sleep disturbance  Objective '[]'$ Expand by Default Physical Exam Vitals reviewed.  Constitutional:       General: She is awake. She is not in acute distress.    Appearance: Normal appearance. She is well-developed, well-groomed and normal weight. She is not ill-appearing, toxic-appearing or diaphoretic.     Interventions: She is not intubated. HENT:     Head: Normocephalic and atraumatic.     Jaw: There is normal jaw occlusion.     Salivary Glands: Right salivary gland is not diffusely enlarged. Left salivary gland is not diffusely enlarged.     Right Ear: Hearing and external ear normal.     Left Ear: Hearing and external ear normal.     Nose: Nose normal. No nasal deformity, signs of injury, laceration, mucosal edema, congestion or rhinorrhea.     Mouth/Throat:     Lips: Pink. No lesions.     Mouth: Mucous membranes are moist. No oral lesions or angioedema.     Dentition: No gum lesions.     Tongue: No lesions. Tongue does not deviate from midline.     Palate: No mass and lesions.     Pharynx: Oropharynx is clear. Uvula midline. No pharyngeal swelling, posterior oropharyngeal erythema or uvula swelling.     Tonsils: No tonsillar exudate.  Eyes:     General: Lids are normal. Vision grossly intact. Gaze aligned appropriately. No allergic shiner or scleral icterus.       Right eye: No discharge.        Left eye: No discharge.     Extraocular Movements: Extraocular movements intact.     Conjunctiva/sclera: Conjunctivae normal.     Pupils: Pupils are equal, round, and reactive to light.  Neck:     Trachea: Trachea and phonation normal. No tracheal deviation.  Cardiovascular:     Rate and Rhythm: Normal rate and regular rhythm.     Pulses: Normal pulses.  Radial pulses are 2+ on the right side and 2+ on the left side.     Heart sounds: Normal heart sounds, S1 normal and S2 normal. Heart sounds not distant. No murmur heard. Pulmonary:     Effort: Pulmonary effort is normal. No accessory muscle usage, prolonged expiration, respiratory distress or retractions. She is not  intubated.     Breath sounds: Normal breath sounds and air entry. No stridor or transmitted upper airway sounds. No decreased breath sounds, wheezing, rhonchi or rales.     Comments: Spoke full sentences without difficulty; no cough observed in exam room Chest:     Chest wall: Tenderness present. No mass, lacerations, deformity, swelling, crepitus or edema.     Comments: TTP continues over rib left at bra line; intercostal muscle palpation patient denied pain; pectoral muscle palpation does not worsen rib pain today; patient spoke full sentences without difficulty; no defect/step off/crepitus/erythema/bruising/edema/flail chest Abdominal:     General: Abdomen is flat.  Musculoskeletal:        General: Normal range of motion.     Right shoulder: No deformity, laceration or crepitus. Normal range of motion. Normal strength.     Left shoulder: No deformity, laceration or crepitus. Normal range of motion. Normal strength.     Right hand: Normal strength. Normal capillary refill.     Left hand: Normal strength. Normal capillary refill.     Cervical back: Normal range of motion and neck supple. No swelling, edema, deformity, erythema, signs of trauma, lacerations, rigidity, torticollis, tenderness or crepitus. No pain with movement. Normal range of motion.     Thoracic back: No swelling, edema, deformity, signs of trauma, lacerations, spasms, tenderness or bony tenderness. Normal range of motion.     Lumbar back: No swelling, edema, deformity, signs of trauma, lacerations or tenderness. Normal range of motion.     Right lower leg: No edema.     Left lower leg: No edema.  Lymphadenopathy:     Head:     Right side of head: No submandibular or preauricular adenopathy.     Left side of head: No submandibular or preauricular adenopathy.     Cervical: No cervical adenopathy.     Right cervical: No superficial cervical adenopathy.    Left cervical: No superficial cervical adenopathy.  Skin:    General:  Skin is warm and dry.     Capillary Refill: Capillary refill takes less than 2 seconds.     Coloration: Skin is not ashen, cyanotic, jaundiced, mottled, pale or sallow.     Findings: No abrasion, abscess, acne, bruising, burn, ecchymosis, erythema, signs of injury, laceration, lesion, petechiae, rash or wound.     Nails: There is no clubbing.  Neurological:     General: No focal deficit present.     Mental Status: She is alert and oriented to person, place, and time. Mental status is at baseline.     GCS: GCS eye subscore is 4. GCS verbal subscore is 5. GCS motor subscore is 6.     Cranial Nerves: Cranial nerves 2-12 are intact. No cranial nerve deficit, dysarthria or facial asymmetry.     Sensory: Sensation is intact.     Motor: Motor function is intact. No weakness, tremor, atrophy, abnormal muscle tone or seizure activity.     Coordination: Coordination is intact. Coordination normal.     Gait: Gait is intact. Gait normal.     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        Mood and Affect: Mood and affect normal.        Speech: Speech normal.        Behavior: Behavior normal. Behavior is cooperative.        Thought Content: Thought content normal.        Cognition and Memory: Cognition and memory normal.        Judgment: Judgment normal.        No crepitus with palpation chest wall; No step off deformity of ribs or flail chest.  Patient spoke full sentences without difficulty.  No cough observed.         Assessment & Plan:    A-left rib pain subsequent visit   P-consider repeat rib series and chest xray.  Patient stated improving/refused at this time.  Sp02 stable BBS CTA  DDx: pleurisy/chondritis/intercostal muscle strain  May take tylenol '1000mg'$  po  q6h prn pain or ibuprofen '800mg'$  po TID prn pain with omeprazole/take with food.  ER precautions reinforced e.g. dyspnea/hemoptysis/worsening chest pain.  BBS CTA sp02 stable rib series last week negative for fracture/dislocation. She still plans to rest after work each day as that seemed to help the most. Patient verbalized understanding information/instructions/agreed with plan of care and had no further questions at this time.

## 2022-04-28 ENCOUNTER — Other Ambulatory Visit (INDEPENDENT_AMBULATORY_CARE_PROVIDER_SITE_OTHER): Payer: No Typology Code available for payment source

## 2022-04-28 ENCOUNTER — Other Ambulatory Visit: Payer: Self-pay | Admitting: Family Medicine

## 2022-04-28 DIAGNOSIS — E782 Mixed hyperlipidemia: Secondary | ICD-10-CM

## 2022-04-28 DIAGNOSIS — E559 Vitamin D deficiency, unspecified: Secondary | ICD-10-CM

## 2022-04-28 LAB — COMPREHENSIVE METABOLIC PANEL
ALT: 27 U/L (ref 0–35)
AST: 25 U/L (ref 0–37)
Albumin: 4.4 g/dL (ref 3.5–5.2)
Alkaline Phosphatase: 59 U/L (ref 39–117)
BUN: 15 mg/dL (ref 6–23)
CO2: 29 mEq/L (ref 19–32)
Calcium: 9.4 mg/dL (ref 8.4–10.5)
Chloride: 101 mEq/L (ref 96–112)
Creatinine, Ser: 0.87 mg/dL (ref 0.40–1.20)
GFR: 74.67 mL/min (ref >60.00)
Glucose, Bld: 86 mg/dL (ref 70–99)
Potassium: 4 mEq/L (ref 3.5–5.1)
Sodium: 138 mEq/L (ref 135–145)
Total Bilirubin: 1 mg/dL (ref 0.2–1.2)
Total Protein: 7.6 g/dL (ref 6.0–8.3)

## 2022-04-28 LAB — LIPID PANEL
Cholesterol: 236 mg/dL — ABNORMAL HIGH (ref 0–200)
HDL: 45.9 mg/dL (ref >39.00)
LDL Cholesterol: 152 mg/dL — ABNORMAL HIGH (ref 0–99)
NonHDL: 189.66
Total CHOL/HDL Ratio: 5
Triglycerides: 189 mg/dL — ABNORMAL HIGH (ref 0.0–149.0)
VLDL: 37.8 mg/dL (ref 0.0–40.0)

## 2022-04-28 LAB — VITAMIN D 25 HYDROXY (VIT D DEFICIENCY, FRACTURES): VITD: 23 ng/mL — ABNORMAL LOW (ref 30.00–100.00)

## 2022-05-03 NOTE — Telephone Encounter (Signed)
Late entry 04/29/22 follow up Patient seen in [facility] stated feeling about the same denied worsening of symptoms able to perform job duties and denied concerns.  A&Ox3 spoke full sentences without difficulty gait sure and steady skin warm dry and pink respirations even and unlabored RA.

## 2022-05-03 NOTE — Telephone Encounter (Signed)
Late entry 04/20/22 follow up.  Patient reported rest over weekend helped her a lot and pain improved.  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  A&Ox3 spoke full sentences without difficulty RA sp02 98% skin warm dry and pink gait sure and steady.  Discussed if worsening symptoms I recommend chest xray.  Patient verbalized understanding information/instructions, agreed with plan of care and had no further questions at this time.

## 2022-05-07 ENCOUNTER — Telehealth: Payer: Self-pay | Admitting: Registered Nurse

## 2022-05-07 ENCOUNTER — Encounter: Payer: Self-pay | Admitting: Registered Nurse

## 2022-05-07 DIAGNOSIS — E559 Vitamin D deficiency, unspecified: Secondary | ICD-10-CM

## 2022-05-07 DIAGNOSIS — E785 Hyperlipidemia, unspecified: Secondary | ICD-10-CM

## 2022-05-07 NOTE — Telephone Encounter (Signed)
Patient reported rib/chest pain almost completely resolved feeling well denied concerns.  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 Gait sure and steady.

## 2022-05-08 NOTE — Telephone Encounter (Signed)
Patient requested that I review lab results PCM ordered with her.  Has appt to follow up with PCM later in the month.  She had stopped taking cholesterol medications as ran out and concerned it would make her bones weak.  Up to date reviewed with patient and no increased risk fractures/osteoporosis noted in adverse effects of medication.  Patient reported she had been having muscle/joint/bone aches while on atorvastatin.  Discussed with patient this is a common side effect reported by patients.  Dose and/or timing of administration eg lowering dose/taking every other day or once a week can sometimes be considered or changing to a different drug in same family can be helpful and she should discuss with her PCM.  Discussed we have atorvastatin on formulary [facility] if she continues on medication to bring new Rx to clinic to fill onsite with no copay.  Discussed with patient vitamin D low recommend D50,000 units po weekly.  If PCM does not order supplement for her to notify me and I will send in Rx for her.  Low vitamin D can have bone aches as a symptom also.  Elevated cholesterol, LDL and triglycerides (patient had run out of atorvastatin) rx expired.  Blood sugar, kidney/liver function and electrolytes normal.  Patient stated she would discuss with PCM and follow up with me prn.  Patient reported rib pain improved and almost resolved.  Denied dyspnea/pain with deep inspiration now.  Patient verbalized understanding information/instructions, agreed with plan of care and had no further questions at this time.   Latest Reference Range & Units 09/23/21 07:24 04/28/22 07:37  COMPREHENSIVE METABOLIC PANEL   Rpt  Sodium 135 - 145 mEq/L  138  Potassium 3.5 - 5.1 mEq/L  4.0  Chloride 96 - 112 mEq/L  101  CO2 19 - 32 mEq/L  29  Glucose 70 - 99 mg/dL  86  BUN 6 - 23 mg/dL  15  Creatinine 0.40 - 1.20 mg/dL  0.87  Calcium 8.4 - 10.5 mg/dL  9.4  Alkaline Phosphatase 39 - 117 U/L  59  Albumin 3.5 - 5.2  g/dL  4.4  AST 0 - 37 U/L  25  ALT 0 - 35 U/L  27  Total Protein 6.0 - 8.3 g/dL  7.6  Total Bilirubin 0.2 - 1.2 mg/dL  1.0  GFR >60.00 mL/min  74.67  Total CHOL/HDL Ratio  3 5  Cholesterol 0 - 200 mg/dL 125 236 (H)  HDL Cholesterol >39.00 mg/dL 44.30 45.90  LDL (calc) 0 - 99 mg/dL 47 152 (H)  NonHDL  80.23 189.66  Triglycerides 0.0 - 149.0 mg/dL 168.0 (H) 189.0 (H)  VLDL 0.0 - 40.0 mg/dL 33.6 37.8  VITD 30.00 - 100.00 ng/mL  23.00 (L)  (H): Data is abnormally high (L): Data is abnormally low Rpt: View report in Results Review for more information

## 2022-05-10 ENCOUNTER — Encounter: Payer: Self-pay | Admitting: Family Medicine

## 2022-05-10 ENCOUNTER — Ambulatory Visit (INDEPENDENT_AMBULATORY_CARE_PROVIDER_SITE_OTHER): Payer: No Typology Code available for payment source | Admitting: Family Medicine

## 2022-05-10 VITALS — BP 118/64 | HR 82 | Temp 97.8°F | Ht 61.0 in | Wt 149.6 lb

## 2022-05-10 DIAGNOSIS — E782 Mixed hyperlipidemia: Secondary | ICD-10-CM

## 2022-05-10 DIAGNOSIS — K2100 Gastro-esophageal reflux disease with esophagitis, without bleeding: Secondary | ICD-10-CM

## 2022-05-10 DIAGNOSIS — K580 Irritable bowel syndrome with diarrhea: Secondary | ICD-10-CM

## 2022-05-10 DIAGNOSIS — E559 Vitamin D deficiency, unspecified: Secondary | ICD-10-CM

## 2022-05-10 DIAGNOSIS — Z0000 Encounter for general adult medical examination without abnormal findings: Secondary | ICD-10-CM

## 2022-05-10 DIAGNOSIS — G43709 Chronic migraine without aura, not intractable, without status migrainosus: Secondary | ICD-10-CM | POA: Diagnosis not present

## 2022-05-10 MED ORDER — PRAVASTATIN SODIUM 40 MG PO TABS: 40.0000 mg | ORAL_TABLET | Freq: Every day | ORAL | 3 refills | Status: DC

## 2022-05-10 MED ORDER — VITAMIN D3 1.25 MG (50000 UT) PO TABS: 1.0000 | ORAL_TABLET | ORAL | 3 refills | Status: DC

## 2022-05-10 NOTE — Progress Notes (Signed)
Patient ID: Isabella Robertson, female    DOB: 11-16-1965, 56 y.o.   MRN: [id number]  This visit was conducted in person.  BP 118/64   Pulse 82   Temp 97.8 F (36.6 C) (Temporal)   Ht '5\' 1"'$  (1.549 m)   Wt 149 lb 9.6 oz (67.9 kg)   SpO2 94%   BMI 28.27 kg/m    CC: CPE Subjective:   HPI: Isabella Robertson is a 56 y.o. female presenting on 05/10/2022 for Annual Exam   Episode of visual disturbance 04/2021 - brain MR reassuring. Started on aspirin and statin given concern for TIA. Does have h/o migraines. Subsequently saw neurology who diagnosed her with chronic migraine with aura, started on magnesium and riboflavin for prevention and NSAID or aspirin for abortive treatment. She notes ongoing left eye discomfort s/p reassuring evaluation.   HLD - on atorvastatin, notes diffuse joint pains since starting atorvastatin. Stop statin without benefit.   R hip pain present for 6 months.   Preventative: Colonoscopy 04/2020 - hemorrhoids, diverticulosis, repeat 10 yrs ([facility]) EGD 04/2020 - normal esophagus, duodenum, gastric mucosal atrophy s/p biopsies showing chronic gastritis and reflux esophagitis ([facility])  Mammogram Bir[facility]2 @ 08/2021 - gets this done at work through mobile unit  Well woman exam - s/p hysterectomy 2010 for heavy bleeding ?fibroids, ovaries remain.  DEXA scan - not due yet Lung cancer screening - not eligible Flu shot - declines COVID vaccine Pfizer 09/2019, 10/2019, booster 06/2020  Tetanus - declines, thinks last done 2003  Pneumonia shot - not due Shingrix - discussed, declines. Has not had chicken pox.  Seat belt use discussed  Sunscreen use discussed. No changing moles on skin.  Sleep - averaging 8 hours/night  Non smoker Alcohol - none Dentist - yearly Eye exam - yearly   Lives with husband and daughter  From [place] Occ: Replacement limited since 2006  Activity: walks regularly at work  Diet: good water intake, fruits/vegetables  daily      Relevant past medical, surgical, family and social history reviewed and updated as indicated. Interim medical history since our last visit reviewed. Allergies and medications reviewed and updated. Outpatient Medications Prior to Visit  Medication Sig Dispense Refill   albuterol (VENTOLIN HFA) 108 (90 Base) MCG/ACT inhaler Inhale 1-2 puffs into the lungs every 4 (four) hours as needed for wheezing or shortness of breath. 6.7 g 1   Cetirizine HCl (ZYRTEC ALLERGY) 10 MG CAPS Take 10 mg by mouth as needed.     Coenzyme Q10 (CO Q-10) 100 MG CAPS Take 1 capsule by mouth daily.  0   dicyclomine (BENTYL) 10 MG capsule Take 1 capsule (10 mg total) by mouth 2 (two) times daily. 60 capsule 0   Magnesium 400 MG TABS Take 1 tablet by mouth daily.     omeprazole (PRILOSEC) 40 MG capsule Take 1 capsule (40 mg total) by mouth 2 (two) times daily. 180 capsule 3   Riboflavin 400 MG TABS Take 1 tablet by mouth daily. 30 tablet    aspirin 81 MG EC tablet Take 1 tablet (81 mg total) by mouth daily. Swallow whole.     atorvastatin (LIPITOR) 40 MG tablet Take 1 tablet (40 mg total) by mouth daily. 30 tablet 11   Cholecalciferol (VITAMIN D) 50 MCG (2000 UT) CAPS Take 1 capsule (2,000 Units total) by mouth daily. 30 capsule    diclofenac Sodium (VOLTAREN) 1 % GEL Apply 2 g topically 4 (four) times  daily as needed.     ibuprofen (ADVIL) 800 MG tablet Take 1 tablet (800 mg total) by mouth 3 (three) times daily. 21 tablet 0   montelukast (SINGULAIR) 10 MG tablet Take 1 tablet (10 mg total) by mouth daily as needed.     No facility-administered medications prior to visit.     Per HPI unless specifically indicated in ROS section below Review of Systems  Constitutional:  Negative for activity change, appetite change, chills, fatigue, fever and unexpected weight change.  HENT:  Negative for hearing loss.   Eyes:  Negative for visual disturbance.  Respiratory:  Negative for cough, chest tightness, shortness  of breath and wheezing.   Cardiovascular:  Negative for chest pain, palpitations and leg swelling.  Gastrointestinal:  Positive for abdominal pain (mild intermittent). Negative for abdominal distention, blood in stool, constipation, diarrhea, nausea and vomiting.  Genitourinary:  Negative for difficulty urinating and hematuria.  Musculoskeletal:  Negative for arthralgias, myalgias and neck pain.  Skin:  Negative for rash.  Neurological:  Positive for headaches (left eye pain). Negative for dizziness, seizures and syncope.  Hematological:  Negative for adenopathy. Does not bruise/bleed easily.  Psychiatric/Behavioral:  Negative for dysphoric mood. The patient is not nervous/anxious.     Objective:  BP 118/64   Pulse 82   Temp 97.8 F (36.6 C) (Temporal)   Ht '5\' 1"'$  (1.549 m)   Wt 149 lb 9.6 oz (67.9 kg)   SpO2 94%   BMI 28.27 kg/m   Wt Readings from Last 3 Encounters:  05/10/22 149 lb 9.6 oz (67.9 kg)  09/30/21 142 lb 8 oz (64.6 kg)  08/21/21 151 lb 8 oz (68.7 kg)      Physical Exam Vitals and nursing note reviewed.  Constitutional:      Appearance: Normal appearance. She is not ill-appearing.  HENT:     Head: Normocephalic and atraumatic.     Right Ear: Tympanic membrane, ear canal and external ear normal. There is no impacted cerumen.     Left Ear: Tympanic membrane, ear canal and external ear normal. There is no impacted cerumen.     Nose: Nose normal.     Mouth/Throat:     Mouth: Mucous membranes are moist.     Pharynx: Oropharynx is clear. No oropharyngeal exudate or posterior oropharyngeal erythema.  Eyes:     General:        Right eye: No discharge.        Left eye: No discharge.     Extraocular Movements: Extraocular movements intact.     Conjunctiva/sclera: Conjunctivae normal.     Pupils: Pupils are equal, round, and reactive to light.  Neck:     Thyroid: No thyroid mass or thyromegaly.  Cardiovascular:     Rate and Rhythm: Normal rate and regular rhythm.      Pulses: Normal pulses.     Heart sounds: Normal heart sounds. No murmur heard. Pulmonary:     Effort: Pulmonary effort is normal. No respiratory distress.     Breath sounds: Normal breath sounds. No wheezing, rhonchi or rales.  Abdominal:     General: Bowel sounds are normal. There is no distension.     Palpations: Abdomen is soft. There is no mass.     Tenderness: There is no abdominal tenderness. There is no guarding or rebound.     Hernia: No hernia is present.  Musculoskeletal:        General: No tenderness.     Cervical back: Normal  range of motion and neck supple. No rigidity.     Right lower leg: No edema.     Left lower leg: No edema.     Comments:  Neg SLR on right No pain with int/ext rotation at right hip. No significant pain at SIJ, GTB or sciatic notch bilaterally.   Lymphadenopathy:     Cervical: No cervical adenopathy.  Skin:    General: Skin is warm and dry.     Findings: No rash.  Neurological:     General: No focal deficit present.     Mental Status: She is alert. Mental status is at baseline.  Psychiatric:        Mood and Affect: Mood normal.        Behavior: Behavior normal.       Results for orders placed or performed in visit on 04/28/22  VITAMIN D 25 Hydroxy (Vit-D Deficiency, Fractures)  Result Value Ref Range   VITD 23.00 (L) 30.00 - 100.00 ng/mL  Comprehensive metabolic panel  Result Value Ref Range   Sodium 138 135 - 145 mEq/L   Potassium 4.0 3.5 - 5.1 mEq/L   Chloride 101 96 - 112 mEq/L   CO2 29 19 - 32 mEq/L   Glucose, Bld 86 70 - 99 mg/dL   BUN 15 6 - 23 mg/dL   Creatinine, Ser 0.87 0.40 - 1.20 mg/dL   Total Bilirubin 1.0 0.2 - 1.2 mg/dL   Alkaline Phosphatase 59 39 - 117 U/L   AST 25 0 - 37 U/L   ALT 27 0 - 35 U/L   Total Protein 7.6 6.0 - 8.3 g/dL   Albumin 4.4 3.5 - 5.2 g/dL   GFR 74.67 >60.00 mL/min   Calcium 9.4 8.4 - 10.5 mg/dL  Lipid panel  Result Value Ref Range   Cholesterol 236 (H) 0 - 200 mg/dL   Triglycerides 189.0 (H)  0.0 - 149.0 mg/dL   HDL 45.90 >39.00 mg/dL   VLDL 37.8 0.0 - 40.0 mg/dL   LDL Cholesterol 152 (H) 0 - 99 mg/dL   Total CHOL/HDL Ratio 5    NonHDL 189.66     Assessment & Plan:   Problem List Items Addressed This Visit     Health maintenance examination - Primary (Chronic)    Preventative protocols reviewed and updated unless pt declined. Discussed healthy diet and lifestyle.       Irritable bowel syndrome with diarrhea    Continues dicyclomine PRN, rare use.       Chronic migraine w/o aura w/o status migrainosus, not intractable    Dx chronic migraine by neurology.  Ok to stop aspirin. Managing with daily magnesium as well as PRN ibuprofen.  She will look to start riboflavin.       Relevant Medications   pravastatin (PRAVACHOL) 40 MG tablet   GERD (gastroesophageal reflux disease)    Stable period on PRN PPI       HLD (hyperlipidemia)    Chronic, deteriorated off statin. Initially started for concern for TIA however neurology thought symptoms were related to migraines. Worried about myalgias, arthralgias due to atorvastatin - will trial pravastatin '40mg'$  daily sent to pharmacy, keep me updated with effect.  RTC 6 mo FLP The 10-year ASCVD risk score (Arnett DK, et al., 2019) is: 2.6%   Values used to calculate the score:     Age: 25 years     Sex: Female     Is Non-Hispanic African American: No     Diabetic: No  Tobacco smoker: No     Systolic Blood Pressure: 122 mmHg     Is BP treated: No     HDL Cholesterol: 45.9 mg/dL     Total Cholesterol: 236 mg/dL       Relevant Medications   pravastatin (PRAVACHOL) 40 MG tablet   Vitamin D deficiency    Levels low despite OTC 2000 IU daily - restart 50k vit D weekly.         Meds ordered this encounter  Medications   DISCONTD: Cholecalciferol (VITAMIN D3) 1.25 MG (50000 UT) TABS    Sig: Take 1 tablet by mouth once a week.    Dispense:  12 tablet    Refill:  3   DISCONTD: pravastatin (PRAVACHOL) 40 MG tablet     Sig: Take 1 tablet (40 mg total) by mouth daily.    Dispense:  90 tablet    Refill:  3   Cholecalciferol (VITAMIN D3) 1.25 MG (50000 UT) TABS    Sig: Take 1 tablet by mouth once a week.    Dispense:  12 tablet    Refill:  3   pravastatin (PRAVACHOL) 40 MG tablet    Sig: Take 1 tablet (40 mg total) by mouth daily.    Dispense:  90 tablet    Refill:  3   No orders of the defined types were placed in this encounter.   Patient instructions: Trate pravastatina '40mg'$  diario para colesterol Comienze vitamina D 50,000 unidades semanal de receta.  Pare aspirinita.  Gusto verla hoy  Regresar en 6 meses para laboratorios Regresar en 1 ao para proximo examen fisico   Follow up plan: Return in about 1 year (around 05/11/2023) for annual exam, prior fasting for blood work.  Ria Bush, MD

## 2022-05-10 NOTE — Assessment & Plan Note (Signed)
Continues dicyclomine PRN, rare use.

## 2022-05-10 NOTE — Assessment & Plan Note (Signed)
Levels low despite OTC 2000 IU daily - restart 50k vit D weekly.

## 2022-05-10 NOTE — Assessment & Plan Note (Signed)
Stable period on PRN PPI

## 2022-05-10 NOTE — Assessment & Plan Note (Signed)
Dx chronic migraine by neurology.  Ok to stop aspirin. Managing with daily magnesium as well as PRN ibuprofen.  She will look to start riboflavin.

## 2022-05-10 NOTE — Assessment & Plan Note (Signed)
Preventative protocols reviewed and updated unless pt declined. Discussed healthy diet and lifestyle.  

## 2022-05-10 NOTE — Assessment & Plan Note (Addendum)
Chronic, deteriorated off statin. Initially started for concern for TIA however neurology thought symptoms were related to migraines. Worried about myalgias, arthralgias due to atorvastatin - will trial pravastatin '40mg'$  daily sent to pharmacy, keep me updated with effect.  RTC 6 mo FLP The 10-year ASCVD risk score (Arnett DK, et al., 2019) is: 2.6%   Values used to calculate the score:     Age: 56 years     Sex: Female     Is Non-Hispanic African American: No     Diabetic: No     Tobacco smoker: No     Systolic Blood Pressure: 101 mmHg     Is BP treated: No     HDL Cholesterol: 45.9 mg/dL     Total Cholesterol: 236 mg/dL

## 2022-05-10 NOTE — Patient Instructions (Addendum)
Trate pravastatina '40mg'$  diario para colesterol Comienze vitamina D 50,000 unidades semanal de receta.  Pare aspirinita.  Gusto verla hoy  Regresar en 6 meses para laboratorios Regresar en 1 ao para proximo examen fisico   Mantenimiento de la salud de las mujeres posmenopusicas Health Maintenance for Postmenopausal Women La menopausia es un proceso normal en el cual la capacidad de quedar embarazada llega a su fin. Este proceso ocurre lentamente a lo largo de un perodo de muchos meses o aos; por lo general, entre los 67 y los 64 aos. La menopausia es completa cuando no se ha tenido el perodo menstrual por 12 meses. Es importante hablar con el mdico sobre algunas de las enfermedades ms comunes que afectan a las mujeres despus de la menopausia (mujeres posmenopusicas). Estas incluyen la enfermedad cardaca, el cncer y la prdida sea (osteoporosis). Adoptar un estilo de vida saludable y recibir atencin preventiva pueden ayudar a promover la salud y Musician. Las medidas que tome tambin pueden reducir las probabilidades de Actor algunas de estas enfermedades frecuentes. Cules son los signos y sntomas de la menopausia? Durante la menopausia, puede tener los siguientes sntomas: Nurse, learning disability. Estos pueden ser moderados o intensos. Sudoracin nocturna. Disminucin del deseo sexual. Cambios en el estado de nimo. Dolores de [place]. Cansancio (fatiga). Irritabilidad. Problemas de memoria. Problemas para quedarse dormida o para seguir durmiendo. Hable con el mdico sobre las opciones de tratamiento para sus sntomas. Necesito terapia de reemplazo hormonal? La terapia de reemplazo hormonal es eficaz para tratar los sntomas causados por la menopausia, como los acaloramientos y las sudoraciones nocturnas. La reposicin hormonal conlleva ciertos riesgos, especialmente a medida que una mujer envejece. Si est pensando en usar estrgeno o estrgeno con progestina, analice los  beneficios y los riesgos con el mdico. Cmo puedo reducir el riesgo de tener enfermedad cardaca y accidente cerebrovascular? A medida que se envejece, aumenta el riesgo de enfermedad cardaca, infarto de miocardio y accidente cerebrovascular. Una de las causas puede ser un cambio en las hormonas del cuerpo durante la menopausia. Esto puede afectar la forma en que el organismo procesa las [place], los triglicridos y el colesterol de su dieta. El infarto de miocardio y el accidente cerebrovascular son emergencias mdicas. Hay muchas cosas que se pueden hacer para ayudar a prevenir la enfermedad cardaca y el accidente cerebrovascular. Contrlese la presin arterial La hipertensin arterial causa enfermedades cardacas y [place] el riesgo de accidente cerebrovascular. Es ms probable que esto se manifieste en las personas que tienen lecturas de presin arterial alta o tienen sobrepeso. Hgase controlar la presin arterial: Cada 3 a 5 aos si tiene entre 18 y 53 aos. Todos los aos si es mayor de 40 aos. Consuma una dieta saludable  Consuma una dieta que incluya muchas verduras, frutas, productos lcteos con bajo contenido de [place] y Advertising account planner. No consuma muchos alimentos ricos en grasas slidas, azcares agregados o sodio. Haga ejercicio con regularidad Haga ejercicio con regularidad. Esta es una de las prcticas ms importantes que puede hacer por su salud. La mayora de los adultos deben seguir estas pautas: Intente realizar al menos 150 minutos de actividad fsica por semana. El ejercicio debe aumentar la frecuencia cardaca y Nature conservation officer transpirar (ejercicio de intensidad moderada). Intente hacer ejercicios de elongacin por lo menos dos veces por semana. Agrguelos al plan de ejercicio de intensidad moderada. Pase menos tiempo sentada. Incluso la actividad fsica ligera puede ser beneficiosa. Otros consejos Trabaje con su mdico para Science writer o Theatre manager un peso saludable. No consuma  ningn producto que contenga nicotina o tabaco. Estos productos incluyen cigarrillos, tabaco para Higher education careers adviser y aparatos de vapeo, como los Psychologist, sport and exercise. Si necesita ayuda para dejar de consumir estos productos, consulte al mdico. Conozca sus cifras. Pdale al mdico que le controle el colesterol y el nivel sanguneo de azcar en la sangre (glucosa). Siga hacindose anlisis de [employer] se lo haya indicado el mdico. Necesito realizarme pruebas de deteccin del cncer? Segn sus antecedentes mdicos y familiares, es posible que deba realizarse pruebas de deteccin del cncer en diferentes etapas de la vida. Esto puede incluir pruebas de deteccin de lo siguiente: Cncer de mama. Cncer de cuello uterino. Cncer de pulmn. Cncer colorrectal. Cul es mi riesgo de tener osteoporosis? Despus de la menopausia, puede correr un riesgo ms alto de tener osteoporosis. La osteoporosis es una afeccin en la cual la destruccin de la masa sea ocurre con mayor rapidez que su formacin. Para ayudar a prevenir esta afeccin o las fracturas seas que pueden ocurrir a causa de [place], usted puede tomar las siguientes medidas: Si tiene entre 28 y 22 aos, reciba como mnimo 1000 mg de calcio y 600 unidades internacionales (UI) de vitamina D por Training and development officer. Si tiene ms de 50 aos pero menos de 70 aos, reciba como mnimo 1200 mg de calcio y al menos 600 unidades internacionales (UI) de vitamina D por da. Si tiene ms de 70 aos, reciba como mnimo 1200 mg de calcio y al menos 800 unidades internacionales (UI) de vitamina D por da. Fumar y beber alcohol en exceso aumentan el riesgo de osteoporosis. Consuma alimentos ricos en calcio y vitamina D, y haga ejercicios con soporte de peso varias veces a la [place], como se lo haya indicado el mdico. De qu manera la menopausia afecta mi salud mental? La depresin puede presentarse a cualquier edad, pero es ms frecuente a medida que una persona envejece. Los  sntomas comunes de depresin incluyen lo siguiente: Sensacin de depresin. Cambios en los patrones de sueo. Cambios en el apetito o en los hbitos de alimentacin. Sensacin de falta general de motivacin o placer al [employer] actividades que sola disfrutar. Crisis frecuentes de llanto. Hable con el mdico si cree que est experimentando alguno de estos sntomas. Indicaciones generales Visite a su mdico para hacerse exmenes de bienestar peridicos y aplicarse vacunas. Puede incluir: Programar exmenes peridicos dentales, de la salud y de Public librarian. Recibir y [employer]. Estos incluyen los siguientes: Human resources officer. Aplquese esta vacuna todos los aos antes de que comience la temporada de gripe. Vacuna contra la neumona. Vacuna contra el herpes. Vacuna contra el ttanos, la difteria y la tos Dyann Ruddle (Tdap). El mdico tambin puede recomendarle que se aplique otras vacunas. Notifique a su mdico si alguna vez ha sido vctima de abuso o si no se siente seguro en su hogar. Resumen La menopausia es un proceso normal en el cual la capacidad de quedar embarazada llega a su fin. Esta condicin causa acaloramientos, sudoraciones nocturnas, disminucin del inters en el sexo, cambios en el estado de nimo, dolores de [place] o falta de sueo. El tratamiento de esta afeccin puede incluir una terapia de reemplazo hormonal. Tome medidas para mantenerse 37, entre ellas, hacer ejercicio con regularidad, seguir una dieta saludable, controlar su peso y medirse la presin arterial y los niveles de Dispensing optician. Hgase pruebas para Film/video editor y depresin. Asegrese de estar al da con todas las vacunas. Esta informacin no tiene Psychologist, clinical  consejo del mdico. Asegrese de hacerle al mdico cualquier pregunta que tenga. Document Revised: 11/25/2020 Document Reviewed: 11/25/2020 Elsevier Patient Education  [place].

## 2022-05-14 MED ORDER — VITAMIN D3 1.25 MG (50000 UT) PO TABS: 1.0000 | ORAL_TABLET | ORAL | 3 refills | Status: DC

## 2022-05-14 NOTE — Addendum Note (Signed)
Addended by: Kris Mouton on: 05/14/2022 03:04 PM   Modules accepted: Orders

## 2022-07-22 LAB — HM MAMMOGRAPHY

## 2022-09-13 ENCOUNTER — Other Ambulatory Visit: Payer: Self-pay | Admitting: Registered Nurse

## 2022-09-13 DIAGNOSIS — K580 Irritable bowel syndrome with diarrhea: Secondary | ICD-10-CM

## 2022-09-14 ENCOUNTER — Encounter: Payer: Self-pay | Admitting: Registered Nurse

## 2022-09-14 MED ORDER — DICYCLOMINE HCL 10 MG PO CAPS: 10.0000 mg | ORAL_CAPSULE | Freq: Two times a day (BID) | ORAL | 0 refills | Status: DC | PRN

## 2022-09-14 NOTE — Telephone Encounter (Signed)
Patient reported ran out last Rx Apr 2023 60 tabs per epic review.  Patient stated not taking daily only as needed working well for her.  Epocrates reviewed no labs/monitoring recommended for annual follow up.  Patient notified rx sent to her pharmacy of choice bentyl '10mg'$  po BID prn #60 RF0.  Patient verbalized understanding information/instructions, agreed with plan of care and had no further questions at this time.  Pharmacy closed message left for staff with Rx information and that I received transmittal failure from Epic.

## 2022-09-29 ENCOUNTER — Other Ambulatory Visit: Payer: Self-pay | Admitting: Family Medicine

## 2022-09-29 DIAGNOSIS — R928 Other abnormal and inconclusive findings on diagnostic imaging of breast: Secondary | ICD-10-CM

## 2022-09-30 ENCOUNTER — Encounter: Payer: Self-pay | Admitting: *Deleted

## 2022-10-20 ENCOUNTER — Ambulatory Visit
Admission: RE | Admit: 2022-10-20 | Discharge: 2022-10-20 | Disposition: A | Discharge status: Home / Self Care | Payer: No Typology Code available for payment source | Source: Ambulatory Visit | Attending: Family Medicine | Admitting: Family Medicine

## 2022-10-20 DIAGNOSIS — R928 Other abnormal and inconclusive findings on diagnostic imaging of breast: Secondary | ICD-10-CM

## 2022-10-21 ENCOUNTER — Encounter: Payer: Self-pay | Admitting: Family Medicine

## 2022-10-21 ENCOUNTER — Other Ambulatory Visit: Payer: Self-pay | Admitting: Family Medicine

## 2022-10-21 DIAGNOSIS — R928 Other abnormal and inconclusive findings on diagnostic imaging of breast: Secondary | ICD-10-CM

## 2022-10-21 DIAGNOSIS — N6320 Unspecified lump in the left breast, unspecified quadrant: Secondary | ICD-10-CM

## 2022-10-28 ENCOUNTER — Ambulatory Visit
Admission: RE | Admit: 2022-10-28 | Discharge: 2022-10-28 | Disposition: A | Discharge status: Home / Self Care | Payer: No Typology Code available for payment source | Source: Ambulatory Visit | Attending: Family Medicine | Admitting: Family Medicine

## 2022-10-28 DIAGNOSIS — N6320 Unspecified lump in the left breast, unspecified quadrant: Secondary | ICD-10-CM

## 2022-10-28 DIAGNOSIS — R928 Other abnormal and inconclusive findings on diagnostic imaging of breast: Secondary | ICD-10-CM

## 2022-10-28 HISTORY — PX: BREAST BIOPSY: SHX20

## 2022-11-06 ENCOUNTER — Other Ambulatory Visit: Payer: Self-pay | Admitting: Family Medicine

## 2022-11-06 DIAGNOSIS — E559 Vitamin D deficiency, unspecified: Secondary | ICD-10-CM

## 2022-11-06 DIAGNOSIS — E782 Mixed hyperlipidemia: Secondary | ICD-10-CM

## 2022-11-08 ENCOUNTER — Other Ambulatory Visit: Payer: No Typology Code available for payment source | Admitting: Occupational Medicine

## 2022-11-08 ENCOUNTER — Other Ambulatory Visit: Payer: Self-pay | Admitting: Family Medicine

## 2022-11-08 ENCOUNTER — Other Ambulatory Visit: Payer: No Typology Code available for payment source

## 2022-11-08 DIAGNOSIS — E782 Mixed hyperlipidemia: Secondary | ICD-10-CM

## 2022-11-08 DIAGNOSIS — E559 Vitamin D deficiency, unspecified: Secondary | ICD-10-CM

## 2022-11-08 NOTE — Progress Notes (Signed)
Lab drawn from Left AC unsuccessful. Lab drawn right AC tolerated well no issues noted.

## 2022-11-10 LAB — LIPID PANEL W/O CHOL/HDL RATIO
Cholesterol, Total: 214 mg/dL — ABNORMAL HIGH (ref 100–199)
HDL: 52 mg/dL (ref >39)
LDL Chol Calc (NIH): 131 mg/dL — ABNORMAL HIGH (ref 0–99)
Triglycerides: 175 mg/dL — ABNORMAL HIGH (ref 0–149)
VLDL Cholesterol Cal: 31 mg/dL (ref 5–40)

## 2022-11-10 LAB — HEPATIC FUNCTION PANEL
ALT: 24 IU/L (ref 0–32)
AST: 28 IU/L (ref 0–40)
Albumin: 4.6 g/dL (ref 3.8–4.9)
Alkaline Phosphatase: 71 IU/L (ref 44–121)
Bilirubin Total: 0.6 mg/dL (ref 0.0–1.2)
Bilirubin, Direct: 0.13 mg/dL (ref 0.00–0.40)
Total Protein: 7.8 g/dL (ref 6.0–8.5)

## 2022-11-10 LAB — SPECIMEN STATUS REPORT

## 2022-11-10 LAB — VITAMIN D 25 HYDROXY (VIT D DEFICIENCY, FRACTURES): Vit D, 25-Hydroxy: 66.3 ng/mL (ref 30.0–100.0)

## 2022-11-17 ENCOUNTER — Telehealth: Payer: Self-pay

## 2022-11-17 NOTE — Telephone Encounter (Signed)
Lvm asking pt to call back. Need to relay following message from Dr. Reece Agar:    Stay off statin (pravastatin) at this time and we will reassess cholesterol control with next labwork.

## 2022-11-18 NOTE — Telephone Encounter (Signed)
Spoke with pt relaying Dr. G's message. Pt verbalizes understanding.  

## 2022-12-09 ENCOUNTER — Encounter: Payer: Self-pay | Admitting: Registered Nurse

## 2022-12-09 ENCOUNTER — Telehealth: Payer: Self-pay | Admitting: Registered Nurse

## 2022-12-09 DIAGNOSIS — Z0000 Encounter for general adult medical examination without abnormal findings: Secondary | ICD-10-CM

## 2022-12-09 NOTE — Telephone Encounter (Signed)
Epic reviewed had PCM appt but no hgba1c and LDL 131 did not meet requirement.  BP 118/64

## 2022-12-19 NOTE — Progress Notes (Signed)
Noted patient has had follow up labs 

## 2022-12-23 ENCOUNTER — Other Ambulatory Visit: Payer: No Typology Code available for payment source | Admitting: Occupational Medicine

## 2022-12-23 DIAGNOSIS — Z0000 Encounter for general adult medical examination without abnormal findings: Secondary | ICD-10-CM

## 2022-12-23 NOTE — Progress Notes (Signed)
Lab drawn from Right AC tolerated well no issues noted.   

## 2022-12-24 LAB — HEMOGLOBIN A1C
Est. average glucose Bld gHb Est-mCnc: 103 mg/dL
Hgb A1c MFr Bld: 5.2 % (ref 4.8–5.6)

## 2022-12-28 ENCOUNTER — Ambulatory Visit: Payer: No Typology Code available for payment source | Admitting: Occupational Medicine

## 2022-12-28 DIAGNOSIS — Z0000 Encounter for general adult medical examination without abnormal findings: Secondary | ICD-10-CM

## 2022-12-28 NOTE — Progress Notes (Signed)
Be well insurance premium discount evaluation: Met   Patient completed PCM office visit. Epic reviewed by RN Kimrey transcribed labs and reviewed with the patient. Tobacco attestation signed. [facility] ROI formed signed. Forms placed in the chart.   Patient given handouts for [facility] pharmacies and discount drugs list, MyChart, [facility], [facility], [place] [facility] and [facility].  What to do for infectious illness protocol. Given handout for list of medications that can be filled at [facility]. Given Clinic hours and Clinic Email.  

## 2023-01-23 NOTE — Telephone Encounter (Signed)
Patient saw RN Bess Kinds 12/28/22 discussed lab results/instructions from NP and given printed copy from epic.  BP 118/66 weight 149lbs LDL 131 Hgba1c less than 7 met Be Well 2025 requirement   compared to 2024 results LDL 47 BP 114/76 weight 142lbs. NP signed provider portion Be Well paperwork 01/11/2023 and RN Kimrey notified HR team patient met requirements for insurance discount for 2025 plan year.

## 2023-03-09 ENCOUNTER — Encounter: Payer: Self-pay | Admitting: Registered Nurse

## 2023-03-09 ENCOUNTER — Telehealth: Payer: Self-pay | Admitting: Registered Nurse

## 2023-03-09 DIAGNOSIS — Z20822 Contact with and (suspected) exposure to covid-19: Secondary | ICD-10-CM

## 2023-03-09 DIAGNOSIS — U071 COVID-19: Secondary | ICD-10-CM

## 2023-03-09 MED ORDER — NIRMATRELVIR/RITONAVIR (PAXLOVID)TABLET: 3.0000 | ORAL_TABLET | Freq: Two times a day (BID) | ORAL | 0 refills | Status: AC

## 2023-03-09 NOTE — Telephone Encounter (Signed)
Patient reported family members with covid exposure this weekend notified NP today positive test yesterday evening.  HR and supervisor notified and paxlovid Rx sent to her pharmacy of choice.  Discussed how to take medication.  She is not taking any medication except aspirin for headache, having chills and runny nose/congestion.  Denied fever/shortness of breath or wheezing.  Discussed may use tylenol/ibuprofen/aspirin/albuterol and vitamins while taking paxlovid.  Sent fda paxlovid handout and exitcare handout on covid  quarantine/isolation x 5 days may return to work 03/14/23 with mask.  Discussed increasing airflow in house/cleaning with disinfectant.  Patient contacted RN Burna Mortimer in clinic pt reported positive home test 03/08/23 Day 0 fatigue, rhinitis, headache and congestion/head cold stayed home today   Pt began quarantine at that time. Patient did not develop symptoms of  trouble breathing, chest pain, vomiting, diarrhea, sore throat, body aches, fever .   5 day quarantine per [facility] recommendations. Day 1 of quarantine was 03/09/2023. Patient to contact clinic staff if vomiting after coughing or unable to tolerate po fluids.  Discussed flu and other viral illnesses circulating in community and some causing GI upset.  If GI upset I have recommended clear fluids then bland diet.  Avoid dairy/spicy, fried and large portions of meat while having nausea.  If vomiting hold po intake x 1 hour.  Then sips clear fluids like broths, ginger ale, power ade, gatorade, pedialyte may advance to soft/bland if no vomiting x 24 hours and appetite returned otherwise hydration main focus. Call me at work from home number if symptoms not improved with plan of care  patient to call if high fever, dehydration, marked weakness, fainting, increased abdominal pain, blood in stool or vomit (red or black).     Reviewed possible Covid symptoms including cough, shortness of breath with exertion or at rest, runny nose, congestion, sinus  pain/pressure, sore throat, fever/chills, body aches, fatigue, loss of taste/smell, GI symptoms of nausea/vomiting/diarrhea. Same day/emergent eval/ER precautions of dizziness/syncope, confusion, blue tint to lips/face, severe shortness of breath/difficulty breathing/wheezing.     Patient to isolate in own room and if possible use only one bathroom if living with others in home.  Wear mask when out of room to help prevent spread to others in household.  Sanitize high touch surfaces with lysol/chlorox/bleach spray or wipes daily as viruses are known to live on surfaces from 24 hours to days.  Patient does want antivirals.  Patient at higher risk for hospitalization due to asthma, hyperlipidemia, migraine, vitamin D deficiency, hypertension, age.   Recommend annual booster 60 days after infection resolution.  Patient is on bentyl last dose a week ago.  prescription medications or daily medications. If taking medications interaction checker epocrates used to verify if any drug interactions. Only taking OTC aspirin last dose yesterday at this time dayquil/nyquil/honey.  Patient paxlovid emergency use handout sent to patient electronically along with covid quarantine exitcare handout both in my chart.  Discussed how to take paxlovid e.g. 3 pills 2 of nirmatrelvir 150mg  and 1 of ritonavir 100mg   by mouth every 12 hours x 5 days. Discussed lemonade can sometimes help with metallic/plastic taste in mouth (side effect medication).  Discussed most common side effects GI upset and bad taste in mouth  Discussed I recommended not having sex with anyone while sick/testing positive/10 day quarantine as could spread virus to partner.  Discussed with patient I would call again this weekend to follow up symptoms/see if questions/concerns.  Exitcare handouts on covid quarantine/home care sent to patient  my chart.  FDA handout on paxlovid sent to patient.   May use salt water gargles and nasal saline 2 sprays each nostril q2h prn  congestion/sore throat.  Research has shown it helps to prevent hospitalizations and decrease discomfort.   May use flonase nasal 1 spray each nostril BID prn rhinitis.  Patient has proair inhaler at home for prn use protracted cough/wheezing/chest tightness.  Hasn't used yet  Discussed albuterol use with patient 1-2 puffs po q4-6 hours prn.   Avoid dehydration and drink water to keep urine pale yellow clear and voiding every 2-4 hours while awake.  Patient alert and oriented x3, spoke full sentences without difficulty.  Some nasal congestion audible during telephone call.  No nasal congestion/cough/throat clearing/hoarse voice/wheezing/shortness of breath during 11 minute telephone call.  Discussed with patient can contact NP Inetta Fermo through my [phone] when clinic closed if questions or concerns until RN returns to clinic on Monday [phone].   Pt verbalized understanding and agreement with plan of care. No further questions/concerns at this time. Pt reminded to contact clinic with any changes in symptoms or questions/concerns. HR notified patient to work remote/quarantine through Day 5 RTW estimated Day 6 with strict mask wear through Day 10 and no eating in employee lunch room.  Estimated return to work onsite 9/9/2024Supervisor notified of excused absence.

## 2023-03-10 ENCOUNTER — Telehealth: Payer: No Typology Code available for payment source | Admitting: Family Medicine

## 2023-03-13 NOTE — Telephone Encounter (Signed)
Spoke with patient via telephone duration of call 2 minutes.  Patient reported feeling much better after starting antiviral bad taste in mouth/acidy but otherwise ok symptoms improved.  Feels well enough to return to work tomorrow 03/14/23 with mask.  Discussed no eating in employee lunchroom through day 10 may eat outside on picnic tables.  If inclement weather see HR and they will notify her what private room to use for eating inside during her mask wear through day 10.  Discussed home covid testing prior to removing mask when around family/household members.  Tolerating po intake without difficulty.  No audible cough/congestion/throat clearing/shortness of breath/wheezing/nasal sniffing during call.  Patient A&Ox3 spoke full sentences without difficulty.  Patient agreed with plan of care and had no further questions at this time.  HR notified cleared to return onsite 03/14/23 with mask.

## 2023-03-29 ENCOUNTER — Ambulatory Visit: Payer: No Typology Code available for payment source | Admitting: Registered Nurse

## 2023-03-29 ENCOUNTER — Encounter: Payer: Self-pay | Admitting: Registered Nurse

## 2023-03-29 VITALS — BP 130/85 | HR 80 | Temp 98.1°F

## 2023-03-29 DIAGNOSIS — R079 Chest pain, unspecified: Secondary | ICD-10-CM

## 2023-03-29 DIAGNOSIS — R590 Localized enlarged lymph nodes: Secondary | ICD-10-CM

## 2023-03-29 DIAGNOSIS — J029 Acute pharyngitis, unspecified: Secondary | ICD-10-CM

## 2023-03-29 DIAGNOSIS — J00 Acute nasopharyngitis [common cold]: Secondary | ICD-10-CM

## 2023-03-29 DIAGNOSIS — R200 Anesthesia of skin: Secondary | ICD-10-CM

## 2023-03-29 MED ORDER — SALINE SPRAY 0.65 % NA SOLN: 2.0000 | NASAL | Status: DC

## 2023-03-29 MED ORDER — FLUTICASONE PROPIONATE 50 MCG/ACT NA SUSP: 1.0000 | Freq: Two times a day (BID) | NASAL | 3 refills | Status: AC

## 2023-03-29 NOTE — Patient Instructions (Addendum)
Dolor de pecho no especfico en los adultos Nonspecific Chest Pain, Adult El dolor de pecho es una sensacin [place], [place] o dolorosa en el pecho. El dolor se siente como una aplastamiento, torsin u opresin en el pecho. Neomia Dear persona puede sentir una sensacin de ardor u hormigueo. El dolor de pecho tambin se puede sentir en la espalda, el cuello, la [place], el hombro o el brazo. Este dolor puede empeorar al [employer], estornudar o respirar profundamente. El dolor de pecho puede deberse a una afeccin potencialmente mortal. Se debe tratar de inmediato. Tambin puede ser provocado por algo que no es potencialmente mortal. Si tiene dolor de [place], puede ser difcil saber la diferencia, por lo tanto es importante que obtenga ayuda de inmediato para asegurarse de que no tiene una afeccin grave. Algunas causas potencialmente mortales del dolor de pecho incluyen: Infarto de miocardio. Un desgarro en el vaso sanguneo principal del cuerpo (diseccin artica). Inflamacin alrededor del corazn (pericarditis). Un problema en los pulmones, como un cogulo de sangre (embolia pulmonar) o un pulmn colapsado (neumotrax). Algunas causas de dolor de pecho que no son potencialmente mortales incluyen: Merchant navy officer. Ansiedad o estrs. Dao de los [place], los msculos y los cartlagos que conforman la pared torcica. Neumona o bronquitis. Culebrilla (virus de la varicela zster). El dolor de pecho puede aparecer y Geneticist, molecular. Tambin puede ser constante. Su mdico le har pruebas clnicas y otros estudios para tratar de Production assistant, radio causa del dolor. El tratamiento depender de la causa del dolor de [place]. Siga estas instrucciones en su casa: Medicamentos Use los medicamentos de venta libre y los recetados solamente como se lo haya indicado el mdico. Si le recetaron un antibitico, tmelo como se lo haya indicado el mdico. No deje de tomar el antibitico aunque comience a sentirse  mejor. Actividad Evite las [employer] causen dolor de [place]. No levante ningn objeto que pese ms de 10 libras (4,5 kg) o que supere el lmite de peso que le hayan indicado, Teacher, adult education que el mdico le diga que puede [place]. Haga reposo como se lo haya indicado el mdico. Retome sus actividades normales solo como se lo haya indicado el mdico. Pregntele al mdico qu actividades son seguras para usted. Estilo de vida     No consuma ningn producto que contenga nicotina o tabaco, como cigarrillos, cigarrillos electrnicos y tabaco de Theatre manager. Si necesita ayuda para dejar de fumar, consulte al mdico. No beba alcohol. Opte por un estilo de vida saludable como se lo hayan recomendado. Puede incluir: Education administrator actividad fsica con regularidad. Pdale al mdico que le sugiera ejercicios que sean seguros para usted. Seguir una dieta cardiosaludable. Esta debe incluir muchas frutas y verduras frescas, cereales integrales, protenas (magras) con bajo contenido de grasa y productos lcteos bajos en grasa. Un nutricionista podr ayudarlo a hacer elecciones de alimentacin saludables. Mantener un peso saludable. Controlar cualquier otra afeccin que tenga, como presin arterial alta (hipertensin) o diabetes. Disminuir el nivel de estrs; por ejemplo, con yoga o tcnicas de relajacin. Instrucciones generales Est atento a cualquier cambio en los sntomas. Es su responsabilidad [employer] de cualquier prueba que se haya realizado. Consulte al mdico o pregunte en el departamento donde se realizan las pruebas cundo estarn [employer]. Concurra a todas las visitas de [address] se lo haya indicado el mdico. Esto es importante. Es posible que le pidan que se someta a ms pruebas si el dolor de pecho no desaparece. Comunquese con un mdico si: El dolor de  pecho no desaparece. Se siente deprimido. Tiene fiebre. Nota cambios en los sntomas o desarrolla sntomas  nuevos. Solicite ayuda de inmediato si: El dolor en el pecho empeora. Tiene tos que empeora o tose con [place]. Siente un dolor intenso en el abdomen. Se desmaya. Tiene una molestia repentina e inexplicable en el pecho. Tiene molestias repentinas e [employer], la espalda, el cuello o la [place]. Le falta el aire en cualquier momento. Comienza a sudar de [place] repentina o la piel se le humedece. Siente nuseas o vomita. Se siente repentinamente mareado o se desmaya. Tiene debilidad intensa, o debilidad o fatiga sin explicacin. Siente que el corazn comienza a latir rpidamente o que se saltea latidos. Estos sntomas pueden representar un problema grave que constituye Radio broadcast assistant. No espere a ver si los sntomas desaparecen. Solicite atencin mdica de inmediato. Comunquese con el servicio de emergencias de su localidad (911 en los [place]). No conduzca por sus propios medios [employer]. Resumen El dolor de pecho puede ser provocado por una afeccin que es grave y requiere tratamiento urgente. Tambin puede ser provocado por algo que no es potencialmente mortal. El mdico puede hacerle pruebas clnicas y otros estudios para tratar de Production assistant, radio causa del dolor. Siga las instrucciones de su mdico sobre tomar [employer], Radio producer cambios en su estilo de vida y recibir tratamiento de urgencia si los sntomas empeoran. Concurra a todas las visitas de seguimiento como se lo haya indicado el mdico. Esto incluye las visitas para realizarle otras pruebas si el dolor de pecho no desaparece. Esta informacin no tiene Theme park manager el consejo del mdico. Asegrese de hacerle al mdico cualquier pregunta que tenga. Document Revised: 09/24/2020 Document Reviewed: 05/06/2022 Elsevier Patient Education  2024 Elsevier Inc. Cmo realizar un enjuague de senos paranasales How to Perform a Sinus Rinse El enjuague de senos paranasales es un tratamiento para Corporate treasurer que se [place] para limpiar los senos paranasales con una mezcla sin microbios (estril) de agua y sal (solucin salina). Los senos paranasales son espacios llenos de aire que se encuentran en el crneo detrs de los huesos de la cara y la frente. Se abren en la cavidad nasal. Un enjuague de senos paranasales puede ayudar a limpiar la mucosidad, la suciedad, el polvo o el polen de sus fosas nasales. Puede realizar un enjuague de senos paranasales cuando tenga un resfriado, un virus, sntomas de alergia nasal, una infeccin en los senos paranasales o congestin en la nariz o los senos paranasales. Cules son los riesgos? Generalmente, un enjuague de senos paranasales es seguro y [place]. Sin embargo, hay algunos riesgos, entre ellos: Una sensacin de ardor en los senos paranasales. Esto puede suceder si no prepara la solucin salina segn las indicaciones. Asegrese de seguir todas las indicaciones al preparar la solucin salina. Irritacin nasal. Infeccin. Esto puede deberse si los materiales estn sucios o si el agua est contaminada. La infeccin debido a que el agua est contaminada es poco frecuente, pero posible. No haga un enjuague de los senos paranasales si se ha tenido [place] de odo o de [place], una infeccin en los odos o a un taponamiento de los odos, a menos que se lo haya recomendado el mdico. Materiales necesarios: Solucin salina o polvo. Agua destilada o estril para Engineer, manufacturing con la solucin salina en polvo. Puede utilizar agua del grifo hervida y luego enfriada. Hierva el agua del grifo durante 5 minutos; enfrela hasta que est tibia. Use dentro de las  24 horas. No use agua del grifo sin hervir para mezclarla con la solucin salina. Lota (Neti pot) o botella para enjuague nasal. Estas liberan la solucin salina dentro de la nariz para que pase a travs de los senos paranasales. Las lotas y las botellas pueden comprarse en la farmacia local, una tienda de alimentos  naturales o por Internet. Cmo realizar un enjuague de senos paranasales  Lvese las manos con agua y jabn durante al menos 20 segundos. Use desinfectante para manos si no dispone de [place] y [place]. Lave su dispositivo de acuerdo con las indicaciones que vienen con el producto y luego squelo. Use la solucin provista con el producto o la que compr por separado en la tienda. Siga las indicaciones de mezclado provistas en el paquete para mezclarla con agua estril o destilada. Llene el dispositivo con la cantidad de solucin salina segn las instrucciones del dispositivo. Prese junto a un lavabo e incline la cabeza a un lado sobre el lavabo. Colquese el pico del dispositivo en la fosa nasal superior (la que est ms cerca del techo). Vierta o presione suavemente la solucin salina dentro de la fosa nasal. El lquido debera drenar de la fosa nasal inferior si usted no est demasiado congestionado. Mientras realiza el enjuague, respire por la boca [place]. Sople suavemente la nariz para eliminar la mucosidad y la solucin [place] para el enjuague. Si sopla fuerte podra causar dolor de odo. Gire la cabeza hacia el otro lado y repita en la otra fosa nasal. Limpie y enjuague el dispositivo con agua limpia, y luego deje que el aire lo seque. Consulte al mdico o farmacutico si tiene preguntas sobre cmo realizar un enjuague de senos paranasales. Resumen El enjuague de senos paranasales es un tratamiento para Arboriculturist, que se [place] para [employer] senos paranasales con una mezcla estril de agua y sal (solucin salina). Puede realizar un enjuague de senos paranasales cuando tenga un resfriado, un virus, sntomas de alergia nasal, una infeccin en los senos paranasales o congestin en la nariz o los senos paranasales. Generalmente, un enjuague de senos paranasales es seguro y [place]. Siga todas las indicaciones atentamente. Esta informacin no tiene Theme park manager el consejo  del mdico. Asegrese de hacerle al mdico cualquier pregunta que tenga. Document Revised: 12/22/2020 Document Reviewed: 12/22/2020 Elsevier Patient Education  2024 Elsevier Inc. Rinitis alrgica en adultos Allergic Rhinitis, Adult  La rinitis alrgica es una reaccin alrgica que afecta la membrana mucosa que se encuentra en la nariz. La membrana mucosa es el tejido que produce mucosidad. Existen dos tipos de rinitis alrgica: Astronomer. A este tipo tambin se le llama "fiebre del heno" y ocurre solo durante ciertas estaciones. Perenne. Este tipo puede ocurrir en cualquier momento del ao. La rinitis alrgica no puede transmitirse de Neomia Dear persona a otra. Esta afeccin puede ser leve, grave o muy grave. Puede aparecer a cualquier edad y se puede superar con los [place]. Cules son las causas? Esta afeccin es causada por alrgenos. Estas son cosas que pueden causar Runner, broadcasting/film/video. Los alrgenos de la rinitis Merchandiser, retail y de la rinitis alrgica perenne pueden ser diferentes. La rinitis alrgica estacional es causada por el polen. El polen puede provenir de los rboles, el pasto y las [place]. La rinitis alrgica perenne puede ser causada por: caros del polvo. Protenas en el pis (orina), la saliva o la caspa de [place]. La caspa son las clulas muertas de la piel de [place]. Humo en general, moho  o humo de automviles. Restos de insectos, como las cucarachas, o sus excrementos. Qu incrementa el riesgo? Una persona tiene ms probabilidades de presentar esta afeccin si tiene antecedentes familiares de Environmental consultant u otras afecciones relacionadas con las alergias, entre las que se incluyen: Armed forces technical officer. Se trata de irritacin e hinchazn en parte de los ojos y los prpados. Asma. Esta afeccin afecta los pulmones y dificulta la respiracin. Dermatitis atpica o eczema. Se trata de irritacin e hinchazn de la piel a largo plazo (crnica). Alergias a los  alimentos. Cules son los signos o sntomas? Los sntomas de esta afeccin incluyen: Tos o estornudos. Taponamiento nasal (congestin nasal), picazn en la nariz o secrecin nasal. Ojos y lagrimeo en los ojos. Sensacin de mucosidad que gotea por la parte posterior de la garganta (goteo posnasal). Esto puede causar dolor de garganta. Dificultad para dormir. Cansancio. Dolor de [place]. Cmo se diagnostica? Esta afeccin se puede diagnosticar en funcin de los sntomas, los antecedentes mdicos y un examen fsico. El mdico puede controlar si tiene alguna afeccin relacionada, por ejemplo: Asma. Ojo rojo. Se trata de irritacin e hinchazn de los ojos a causa de la infeccin (conjuntivitis). Infeccin en los odos. Infeccin de las vas respiratorias superiores. Esta es una infeccin de la nariz, la garganta o las vas respiratorias superiores. Tambin puede someterse a estudios para Financial risk analyst qu alrgenos causan los sntomas. Por ejemplo, anlisis cutneos y de [place]. Cmo se trata? No hay cura para esta afeccin, pero el tratamiento puede ayudar a [employer] sntomas. El tratamiento puede incluir: Usar medicamentos que [employer] sntomas de la [place], como los corticoesteroides (antiinflamatorios) y los antihistamnicos. Medicamentos que pueden administrarse por inyeccin, aerosol nasal o pldoras. Evitar los alrgenos que corresponda. La exposicin repetida a pequeas cantidades de alrgenos para ayudarlo a generar defensas contra los alrgenos (inmunoterapia con alrgenos). Esto se realiza si otros tratamientos no han sido exitosos. Puede incluir lo siguiente: Agricultural engineer. Se trata de medicamentos inyectables que contienen pequeas cantidades de alrgenos. Inmunoterapia sublingual. Esta implica la administracin de pequeas dosis de un medicamento con alrgenos debajo de la lengua. Si estos tratamientos no funcionan, el mdico puede recetarle medicamentos ms nuevos  y ms fuertes. Siga estas instrucciones en su casa: Evite los alrgenos Averige a qu es alrgico y evite esos alrgenos. Hay medidas que puede tomar para ayudar a Secretary/administrator: Si tiene Environmental consultant perennes: Reemplace las alfombras por pisos de [place], baldosas o vinilo. Las alfombras pueden retener la caspa o los pelos de los animales y el polvo. No fume. No permita que fumen en su hogar. Cambie los filtros de la calefaccin y del aire acondicionado al menos una vez al mes. Si tiene Theatre manager, siga estos pasos durante la temporada de alergias: Mantenga las ventanas cerradas la mayor cantidad de [place] posible. Planee actividades al aire libre cuando las concentraciones de polen estn en su nivel ms bajo. Fjese en las concentraciones de polen antes de planificar actividades al [place]. Cuando ingrese al interior, [place] de ropa y dese una ducha antes de sentarse en los muebles o la cama. Si tiene [employer] casa que produce alrgenos: Mantenga a la mascota fuera del dormitorio. Pase la aspiradora, barra y limpie el polvo con regularidad. Instrucciones generales Use los medicamentos de venta libre y los recetados solo como se lo haya indicado el mdico. Beba suficiente lquido como para Pharmacologist la orina de color amarillo plido. Dnde buscar ms informacin American Academy of Allergy, Asthma &  Immunology (Academia Estadounidense de [place], [place] e Inmunologa): aaaai.org Comunquese con un mdico si: Tiene fiebre. Tiene tos que no desaparece. Emite sonidos de silbidos agudos al respirar, ms a menudo al exhalar (sibilancias). Los sntomas lo enlentecen o impiden que haga sus actividades normales [employer]. Solicite ayuda de inmediato si: Le falta el aire. Este sntoma puede Customer service manager. Solicite ayuda de inmediato. Llame al 911. No espere a ver si los sntomas desaparecen. No conduzca por sus propios medios [employer]. Esta  informacin no tiene Theme park manager el consejo del mdico. Asegrese de hacerle al mdico cualquier pregunta que tenga. Document Revised: 04/01/2022 Document Reviewed: 04/01/2022 Elsevier Patient Education  2024 Elsevier Inc. Rinitis no alrgica Nonallergic Rhinitis La rinitis no alrgica es la inflamacin de la membrana mucosa que se encuentra dentro de la [place]. La membrana mucosa es el tejido que produce mucosidad. Esta afeccin es diferente de la rinitis Counselling psychologist, que es una alergia que afecta la [place]. La rinitis alrgica ocurre cuando el sistema de defensa del cuerpo, o sistema inmunitario, reacciona a una sustancia a la cual una persona es Counselling psychologist (alrgeno), como el polen, la caspa de las [place], el moho o el polvo. La rinitis no alrgica tiene muchos sntomas similares, pero no es causada por alrgenos. La rinitis no alrgica puede ser un problema agudo o crnico. Esto significa que puede ser a corto o a Air cabin crew. Cules son las causas? Esta afeccin puede obedecer a diferentes causas. Algunos tipos frecuentes de rinitis no alrgica incluyen los siguientes: Rinitis infecciosa. Generalmente, la causa es una infeccin en la nariz, la garganta o las vas respiratorias superiores (sistema respiratorio superior). Rinitis vasomotora. Este es el tipo ms frecuente. Es causada por un exceso de flujo sanguneo por la Clinical cytogeneticist, y hace que la nariz se hinche. Se desencadena por olores fuertes, aire fro, estrs, consumo de alcohol, humo de cigarrillo o cambios en el clima. Rinitis ocupacional. La causa de este tipo de rinitis es la presencia de factores desencadenantes en el lugar de [place], como sustancias qumicas, polvo, caspa de [place] o contaminacin del aire. Rinitis hormonal, en mujeres adolescentes y adultas. La causa de este tipo de rinitis es un aumento de la hormona estrgeno y puede ocurrir durante el Psychiatrist, la pubertad o los perodos [employer]. La rinitis hormonal  provoca menos sntomas cuando los niveles de estrgeno disminuyen. Rinitis inducida por frmacos. Hay varios tipos de medicamentos que pueden causar esta afeccin, como los medicamentos para la presin arterial alta o la enfermedad cardaca, la aspirina o los antiinflamatorios no esteroideos (AINE). Rinitis no alrgica con sndrome de eosinofilia (NARES). La causa de este tipo de rinitis es la presencia de una cantidad excesiva de eosinfilos, un tipo de glbulos blancos. Otras causas incluyen una reaccin a comer alimentos picantes o condimentados. Por lo general, no causa sntomas a Air cabin crew. En algunos casos, la causa de la rinitis no alrgica se desconoce. Qu incrementa el riesgo? Es ms probable que contraiga esta afeccin si: Cleone Slim 30 y 20 aos. Es mujer. Las mujeres tienen el doble de probabilidades de Warehouse manager esta afeccin. Cules son los signos o sntomas? Los sntomas frecuentes de esta afeccin incluyen los siguientes: Nariz tapada (congestin nasal). Secrecin nasal. Sensacin de mucosidad que gotea por la parte posterior de la garganta (goteo posnasal). Dificultad para dormir. Cansancio o fatiga. Otros sntomas pueden incluir los siguientes: Estornudos. Tos. Escozor en la Clinical cytogeneticist. Ojos enrojecidos. Cmo se diagnostica? Este tipo de rinitis se puede diagnosticar  en funcin de lo siguiente: Los sntomas y antecedentes mdicos. Un examen fsico. Pruebas de alergia para descartar rinitis alrgica. Pueden indicarle anlisis de sangre o anlisis de la piel. El mdico tambin puede hacerle un hisopado de secrecin nasal para detectar un aumento de la cantidad de eosinfilos. Esto se realizara para confirmar el diagnstico de NARES. Cmo se trata? El tratamiento de esta afeccin depende de la causa. No hay un tratamiento que funcione para todos. Trabaje con el mdico para encontrar el tratamiento ms adecuado para usted. El tratamiento puede incluir: Automotive engineer las cosas que  desencadenan los sntomas. Medicamentos para Technical sales engineer congestin, por ejemplo: Aerosol nasal con corticoesteroides. Hay varios tipos. Tal vez deba probar algunos para determinar cul le resulta ms eficaz. Medicamentos descongestivos. Se usan para tratar la congestin nasal y pueden administrarse por la boca o en forma de aerosol nasal. Estos medicamentos se Insurance risk surveyor solo durante un corto tiempo. Medicamentos para Technical sales engineer secrecin nasal. Estos pueden incluir medicamentos antihistamnicos o Engineer, technical sales. Irrigacin nasal. Esto implica el uso de un atomizador con una solucin de agua y sal (solucin salina) o un recipiente con solucin salina llamado Neti pot. La irrigacin nasal ayuda a eliminar la mucosidad y a Pharmacologist las fosas nasales hmedas. Ciruga para extirpar parte de la membrana mucosa. Esta se realiza [employer] graves si la afeccin no mejora despus de 6 a 12 meses de [place]. Siga estas instrucciones en su casa: Medicamentos [place] o use los medicamentos de venta libre y los recetados solamente como se lo haya indicado el mdico. No deje de usar el medicamento aunque comience a Actor. No tome antiinflamatorios no esteroideos (AINE), como ibuprofeno, ni medicamentos que contengan aspirina si empeoran sus sntomas. Estilo de vida No beba alcohol si empeora sus sntomas. No consuma ningn producto que contenga nicotina o tabaco. Estos productos incluyen cigarrillos, tabaco para Theatre manager y aparatos de vapeo, como los Administrator, Civil Service. Si necesita ayuda para dejar de fumar, consulte al mdico. Evite ser fumador pasivo. Instrucciones generales Evite los factores desencadenantes que empeoran los sntomas. Use irrigacin nasal como se lo haya indicado el mdico. Duerma con la cabecera de la cama elevada. Esto puede reducir la congestin nasal cuando duerme. Beber suficiente lquido para Radio producer pis (orina) de color amarillo  plido. Comunquese con un mdico si: Tiene fiebre. Los sntomas empeoran en su casa. Los sntomas no se [employer]. Presenta sntomas nuevos, especialmente dolor de cabeza o hemorragia nasal. Solicite ayuda de inmediato si: Tiene dificultad para respirar. Este sntoma puede Customer service manager. Solicite ayuda de inmediato. Llame al 911. No espere a ver si los sntomas desaparecen. No conduzca por sus propios medios [employer]. Esta informacin no tiene Theme park manager el consejo del mdico. Asegrese de hacerle al mdico cualquier pregunta que tenga. Document Revised: 03/29/2022 Document Reviewed: 03/29/2022 Elsevier Patient Education  2024 Elsevier Inc. Neuralgia del trigmino Trigeminal Neuralgia  La neuralgia del trigmino es un trastorno nervioso que causa dolor intenso en un lado de la cara. El dolor puede durar entre unos segundos y varios minutos, pero puede ocurrir cientos de Immunologist. Habitualmente, el dolor aparece en un lado de la cara. Los sntomas pueden presentarse [address], semanas o meses, y Clinical biochemist por meses o aos. El dolor puede volver y ser peor que antes. Cules son las causas? Esta afeccin puede ser causada por lo siguiente: [place] Ramus o presin en un nervio de la cabeza que se llama nervio trigmino.  Las siguientes acciones pueden desencadenar una crisis: Systems developer. Maquillarse. Lavarse, afeitarse o tocarse la cara. Cepillarse los dientes. Las rfagas de aire fro o caliente. Trastornos desmielinizantes primarios, como la esclerosis mltiple. Tumores. Qu incrementa el riesgo? Es ms probable que sufra esta afeccin si: Tiene entre 50 y 80 aos de edad. Es mujer. Cules son los signos o sntomas? El sntoma principal de esta afeccin es un dolor intenso en la [place], los labios, los ojos, la [place], el cuero cabelludo, la frente y Recruitment consultant. Cmo se diagnostica? Esta afeccin se diagnostica  mediante un examen fsico. Se puede realizar una exploracin por tomografa computarizada (TC) o una resonancia magntica (RM) para descartar la presencia de otros trastornos que pueden causar dolor facial. Cmo se trata? El tratamiento de esta afeccin puede incluir: Medidas para evitar las cosas que desencadenan los sntomas. Medicamentos recetados, [employer]. Procedimientos como ablacin, tratamiento trmico o radioterapia. Terapia cognitiva o conductual. Tratamientos complementarios, tales como: Ejercicio suave y regular o yoga. Meditacin. Aromaterapia. Acupuntura. Ciruga. Esta puede realizarse [employer] casos son graves, si otro tratamiento mdico no brinda alivio. El tratamiento puede demorar hasta un mes en comenzar a Engineer, materials. Siga estas indicaciones en su casa: Control del dolor  Infrmese lo ms que pueda sobre cmo Runner, broadcasting/film/video. Pregntele al mdico si sera til recurrir a Dance movement psychotherapist. Considere hablar con un mdico especializado en salud mental sobre cmo lidiar con Chief Technology Officer. Considere la posibilidad de Advertising account planner en un grupo de apoyo para Chief Technology Officer. Indicaciones generales Use los medicamentos de venta libre y los recetados solamente como se lo haya indicado el mdico. Evite las cosas que desencadenan los sntomas. Puede ser de ayuda: Masticar del lado de la boca que no est afectado. Evitar tocarse el rostro. Evitar las rfagas de aire fro o caliente. Concurra a todas las visitas de seguimiento. Dnde buscar ms informacin Facial Pain Association (Asociacin del dolor facial): facepain.org Comunquese con un mdico si: Los medicamentos que toma no [employer]. Los medicamentos que toma para el tratamiento le causan efectos secundarios. Le aparecen sntomas nuevos que no se pueden explicar, por ejemplo: Visin doble. Debilidad o adormecimiento facial. Cambios en la audicin o el equilibrio. Se siente  deprimida. Solicite ayuda de inmediato si: El dolor es intenso y no mejora. Tiene pensamientos suicidas. Si alguna vez siente que puede lastimarse o Physicist, medical a Economist, o tiene pensamientos de poner fin a su vida, busque ayuda de inmediato. Dirjase al servicio de urgencias ms cercano o: Comunquese con el servicio de emergencias de su localidad (911 en los [place]). Llame a una lnea de [place] para crisis suicidas, como la National Suicide Prevention Lifeline (Lnea Telefnica Nacional para la Prevencin del Suicidio) al [phone] o al 988 en los [place]. Esta lnea atiende las 24 horas del da en los [place] Enve un mensaje de texto a la lnea para casos de crisis al [phone] (en los [place] Resumen La neuralgia del trigmino es un trastorno nervioso que causa dolor intenso en un lado de la cara. El dolor puede durar de unos segundos a varios minutos. La causa de esta afeccin es el dao o la presin en un nervio de la cabeza que se llama trigmino. El tratamiento puede incluir evitar las cosas que desencadenan los sntomas, tomar medicamentos, o someterse a [place] o procedimientos. El tratamiento puede demorar hasta un mes en comenzar a Engineer, materials. Concurra a todas las visitas de  seguimiento. Esta informacin no tiene Theme park manager el consejo del mdico. Asegrese de hacerle al mdico cualquier pregunta que tenga. Document Revised: 01/08/2021 Document Reviewed: 01/08/2021 Elsevier Patient Education  2024 Elsevier Inc. Linfadenopata Lymphadenopathy  La linfadenopata es cuando los ganglios linfticos estn inflamados o son mayores que lo normal.  Los ganglios linfticos son aglutinamientos de tejido. Estos filtran y atrapan grmenes y desechos de los tejidos entre el organismo y el torrente sanguneo. Sherron Monday parte del sistema de defensa del organismo o sistema inmunitario. La linfadenopata tiene diferentes causas, como infeccin, enfermedad autoinmunitaria y  Database administrator. La linfadenopata puede presentarse dondequiera que uno tenga ganglios linfticos. El tipo de linfadenopata depende de los ganglios en que se encuentre, por ejemplo: Linfadenopata cervical. Esta se produce en el cuello. Linfadenopata mediastinal. Esta se produce en el pecho. Linfadenopata hiliar. Esta se produce en los pulmones. Linfadenopata axilar. Esta se produce en las axilas. Linfadenopata inguinal. Esta se produce en la ingle. A veces, el lquido y las clulas que combaten las infecciones se acumulan en los ganglios linfticos. Esto ocurre cuando el sistema inmunitario reacciona a los grmenes u otras sustancias que ingresan en el cuerpo. Esto hace que los ganglios linfticos se hinchen y se agranden. El tratamiento se basa en lo que se determine que es la causa. A veces, los ganglios linfticos no vuelven a su tamao normal despus del tratamiento. Si no es as, el mdico puede indicarle pruebas para saber por qu las glndulas siguen hinchadas y [place]. Siga estas instrucciones en su casa:  Tome los medicamentos de venta libre y los recetados solamente como se lo haya indicado el mdico. Si le recetaron antibiticos, no los suspenda aunque comience a Actor. Si se lo indican, aplquese calor en los ganglios linfticos hinchados como se lo haya indicado el mdico. Use la fuente de calor que el mdico le recomiende, como una compresa de calor hmedo o una almohadilla trmica. Coloque una toalla entre la piel y la fuente de Airline pilot. Deje el calor solamente durante el tiempo que le haya indicado el mdico para evitar lesiones. Si la piel se le pone de color rojo brillante, retire Company secretary de inmediato para evitar quemaduras. El riesgo de quemaduras es mayor si no puede sentir el dolor, el calor o el fro. Contrlese los ganglios linfticos hinchados todos los das para Armed forces logistics/support/administrative officer. Contrlese otros lugares donde tenga ganglios linfticos tal como se lo hayan indicado.  Controle si presenta cambios como: Aumento de la hinchazn. Crecimiento repentino del tamao. Enrojecimiento o dolor. Dureza. Comunquese con un mdico si: Tiene ganglios linfticos que: Todava estn hinchados despus de 2 semanas. Se han agrandado de manera repentina o la hinchazn se ha extendido. Estn rojos o duros, o Secretary/administrator. Fuga de lquido de la piel cerca de un ganglio linftico hinchado. Tiene fiebre, escalofros o sudores nocturnos. Siente cansancio. Le duele la garganta. Le duele el abdomen. Pierde peso sin proponrselo. Esta informacin no tiene Theme park manager el consejo del mdico. Asegrese de hacerle al mdico cualquier pregunta que tenga. Document Revised: 10/27/2022 Document Reviewed: 10/27/2022 Elsevier Patient Education  2024 [employer].
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
## 2023-04-13 NOTE — Telephone Encounter (Signed)
Patient seen in [facility] 03/17/23 stated work going well denied concerns.  A&Ox3 spoke full sentences without difficulty skin warm dry and pink gait sure and steady respirations even and unlabored.  Tolerating po intake without difficulty

## 2023-04-21 ENCOUNTER — Ambulatory Visit: Payer: No Typology Code available for payment source | Admitting: Registered Nurse

## 2023-04-21 ENCOUNTER — Encounter: Payer: Self-pay | Admitting: Registered Nurse

## 2023-04-21 VITALS — BP 114/80 | HR 80

## 2023-04-21 DIAGNOSIS — S39012A Strain of muscle, fascia and tendon of lower back, initial encounter: Secondary | ICD-10-CM

## 2023-04-21 MED ORDER — ACETAMINOPHEN 500 MG PO TABS: 1000.0000 mg | ORAL_TABLET | Freq: Four times a day (QID) | ORAL | Status: AC | PRN

## 2023-04-21 MED ORDER — BIOFREEZE COOL THE PAIN 4 % EX GEL: 1.0000 | Freq: Four times a day (QID) | CUTANEOUS | Status: DC | PRN

## 2023-04-21 NOTE — Patient Instructions (Signed)
Low Back Sprain or Strain Rehab Ask your health care provider which exercises are safe for you. Do exercises exactly as told by your health care provider and adjust them as directed. It is normal to feel mild stretching, pulling, tightness, or discomfort as you do these exercises. Stop right away if you feel sudden pain or your pain gets worse. Do not begin these exercises until told by your health care provider. Stretching and range-of-motion exercises These exercises warm up your muscles and joints and improve the movement and flexibility of your back. These exercises also help to relieve pain, numbness, and tingling. Lumbar rotation  Lie on your back on a firm bed or the floor with your knees bent. Straighten your arms out to your sides so each arm forms a 90-degree angle (right angle) with a side of your body. Slowly move (rotate) both of your knees to one side of your body until you feel a stretch in your lower back (lumbar). Try not to let your shoulders lift off the floor. Hold this position for ____30______ seconds. Tense your abdominal muscles and slowly move your knees back to the starting position. Repeat this exercise on the other side of your body. Repeat ____3______ times. Complete this exercise ___2_______ times a day. Single knee to chest  Lie on your back on a firm bed or the floor with both legs straight. Bend one of your knees. Use your hands to move your knee up toward your chest until you feel a gentle stretch in your lower back and buttock. Hold your leg in this position by holding on to the front of your knee. Keep your other leg as straight as possible. Hold this position for ____30______ seconds. Slowly return to the starting position. Repeat with your other leg. Repeat ___3_______ times. Complete this exercise ____2______ times a day. Prone extension on elbows  Lie on your abdomen on a firm bed or the floor (prone position). Prop yourself up on your elbows. Use  your arms to help lift your chest up until you feel a gentle stretch in your abdomen and your lower back. This will place some of your body weight on your elbows. If this is uncomfortable, try stacking pillows under your chest. Your hips should stay down, against the surface that you are lying on. Keep your hip and back muscles relaxed. Hold this position for ____30______ seconds. Slowly relax your upper body and return to the starting position. Repeat ___3_______ times. Complete this exercise ___2_______ times a day. Strengthening exercises These exercises build strength and endurance in your back. Endurance is the ability to use your muscles for a long time, even after they get tired. Pelvic tilt This exercise strengthens the muscles that lie deep in the abdomen. Lie on your back on a firm bed or the floor with your legs extended. Bend your knees so they are pointing toward the ceiling and your feet are flat on the floor. Tighten your lower abdominal muscles to press your lower back against the floor. This motion will tilt your pelvis so your tailbone points up toward the ceiling instead of pointing to your feet or the floor. To help with this exercise, you may place a small towel under your lower back and try to push your back into the towel. Hold this position for ____30______ seconds. Let your muscles relax completely before you repeat this exercise. Repeat _____3_____ times. Complete this exercise ____2______ times a day. Alternating arm and leg raises  Get on your hands  and knees on a firm surface. If you are on a hard floor, you may want to use padding, such as an exercise mat, to cushion your knees. Line up your arms and legs. Your hands should be directly below your shoulders, and your knees should be directly below your hips. Lift your left leg behind you. At the same time, raise your right arm and straighten it in front of you. Do not lift your leg higher than your hip. Do not lift  your arm higher than your shoulder. Keep your abdominal and back muscles tight. Keep your hips facing the ground. Do not arch your back. Keep your balance carefully, and do not hold your breath. Hold this position for ____30______ seconds. Slowly return to the starting position. Repeat with your right leg and your left arm. Repeat ___3_______ times. Complete this exercise _____2_____ times a day. Abdominal set with straight leg raise  Lie on your back on a firm bed or the floor. Bend one of your knees and keep your other leg straight. Tense your abdominal muscles and lift your straight leg up, 4-6 inches (10-15 cm) off the ground. Keep your abdominal muscles tight and hold this position for ______30____ seconds. Do not hold your breath. Do not arch your back. Keep it flat against the ground. Keep your abdominal muscles tense as you slowly lower your leg back to the starting position. Repeat with your other leg. Repeat ____3______ times. Complete this exercise ____2______ times a day. Single leg lower with bent knees Lie on your back on a firm bed or the floor. Tense your abdominal muscles and lift your feet off the floor, one foot at a time, so your knees and hips are bent in 90-degree angles (right angles). Your knees should be over your hips and your lower legs should be parallel to the floor. Keeping your abdominal muscles tense and your knee bent, slowly lower one of your legs so your toe touches the ground. Lift your leg back up to return to the starting position. Do not hold your breath. Do not let your back arch. Keep your back flat against the ground. Repeat with your other leg. Repeat ____3______ times. Complete this exercise _____2_____ times a day. Posture and body mechanics Good posture and healthy body mechanics can help to relieve stress in your body's tissues and joints. Body mechanics refers to the movements and positions of your body while you do your daily activities.  Posture is part of body mechanics. Good posture means: Your spine is in its natural S-curve position (neutral). Your shoulders are pulled back slightly. Your head is not tipped forward (neutral). Follow these guidelines to improve your posture and body mechanics in your everyday activities. Standing  When standing, keep your spine neutral and your feet about hip-width apart. Keep a slight bend in your knees. Your ears, shoulders, and hips should line up. When you do a task in which you stand in one place for a long time, place one foot up on a stable object that is 2-4 inches (5-10 cm) high, such as a footstool. This helps keep your spine neutral. Sitting  When sitting, keep your spine neutral and keep your feet flat on the floor. Use a footrest, if necessary, and keep your thighs parallel to the floor. Avoid rounding your shoulders, and avoid tilting your head forward. When working at a desk or a computer, keep your desk at a height where your hands are slightly lower than your elbows. Slide your  chair under your desk so you are close enough to maintain good posture. When working at a computer, place your monitor at a height where you are looking straight ahead and you do not have to tilt your head forward or downward to look at the screen. Resting When lying down and resting, avoid positions that are most painful for you. If you have pain with activities such as sitting, bending, stooping, or squatting, lie in a position in which your body does not bend very much. For example, avoid curling up on your side with your arms and knees near your chest (fetal position). If you have pain with activities such as standing for a long time or reaching with your arms, lie with your spine in a neutral position and bend your knees slightly. Try the following positions: Lying on your side with a pillow between your knees. Lying on your back with a pillow under your knees. Lifting  When lifting objects, keep  your feet at least shoulder-width apart and tighten your abdominal muscles. Bend your knees and hips and keep your spine neutral. It is important to lift using the strength of your legs, not your back. Do not lock your knees straight out. Always ask for help to lift heavy or awkward objects. This information is not intended to replace advice given to you by your health care provider. Make sure you discuss any questions you have with your health care provider. Document Revised: 10/25/2022 Document Reviewed: 09/08/2020 Elsevier Patient Education  2024 [employer] Lumbar Strain A lumbar strain, which is sometimes called a low-back strain, is a stretch or tear in a muscle or tendons in the lower back (lumbar spine). Tendons are the strong cords of tissue that attach muscle to bone. This type of injury occurs when muscles or tendons are torn or are stretched beyond their limits. Lumbar strains can range from mild to severe. Mild strains may involve stretching a muscle or tendon without tearing it. These may heal in 1-2 weeks. More severe strains involve tearing of muscle fibers or tendons. These will cause more pain and may take 6-8 weeks to heal. What are the causes? This condition may be caused by: Trauma, such as a fall or a hit to the body. Twisting or overstretching the back. This may result from doing activities that take a lot of energy, such as lifting heavy objects. What increases the risk? A lumbar strain is more common in: Athletes. People with obesity. People who do repeated lifting, bending, or other movements that involve their back. What are the signs or symptoms? Symptoms of this condition may include: Sharp or dull pain in the lower back that does not go away. The pain may spread to the buttocks. Stiffness or limited range of motion. Sudden muscle tightening (spasms). How is this diagnosed? This condition may be diagnosed based on: Your symptoms. Your medical history. A  physical exam. Imaging tests, such as: X-rays. MRI. How is this treated? Treatment for this condition may include: Rest. Applying heat and cold to the affected area. Over-the-counter medicines to help with pain and inflammation, such as NSAIDs. Prescription medicine for pain or to relax the muscles. These may be needed for a short time. Physical therapy exercises to improve movement and strength. Follow these instructions at home: Managing pain, stiffness, and swelling     If told, put ice on the injured area during the first 24 hours after your injury. Put ice in a plastic bag. Place a towel between  your skin and the bag. Leave the ice on for 20 minutes, 2-3 times a day. If told, apply heat to the affected area as often as told by your health care provider. Use the heat source that your health care provider recommends, such as a moist heat pack or a heating pad. Place a towel between your skin and the heat source. Leave the heat on for 20-30 minutes. Remove the heat if your skin turns bright red. This is especially important if you are unable to feel pain, heat, or cold. You have a greater risk of getting burned. If your skin turns bright red, remove the ice or heat right away to prevent skin damage. The risk of damage is higher if you cannot feel pain, heat, or cold. Activity Rest and return to your normal activities as told by your health care provider. Ask your health care provider what activities are safe for you. Do exercises as told by your health care provider. General instructions Take over-the-counter and prescription medicines only as told by your health care provider. Ask your health care provider if the medicine prescribed to you: Requires you to avoid driving or using machinery. Can cause constipation. You may need to take these actions to prevent or treat constipation: Drink enough fluid to keep your urine pale yellow. Take over-the-counter or prescription  medicines. Eat foods that are high in fiber, such as beans, whole grains, and fresh fruits and vegetables. Limit foods that are high in fat and processed sugars, such as fried or sweet foods. Do not use any products that contain nicotine or tobacco. These products include cigarettes, chewing tobacco, and vaping devices, such as e-cigarettes. If you need help quitting, ask your health care provider. How is this prevented? To prevent a future low-back injury: Always warm up properly before physical activity or sports. Cool down and stretch after being active. Use correct form when playing sports and lifting heavy objects. Bend your knees before you lift heavy objects. Use good posture when sitting and standing. Stay physically fit and keep a healthy weight. Do at least 150 minutes of moderate intensity exercise each week, such as brisk walking or water aerobics. Do strength exercises at least 2 times each week. Contact a health care provider if: Your back pain does not improve after several weeks of treatment. Your symptoms get worse. You have a fever. Get help right away if: Your back pain is severe. You are unable to stand or walk. You develop pain in your legs. You have weakness in your buttocks or legs. You have trouble controlling when you urinate or when you have a bowel movement. You have frequent, painful, or bloody urination. This information is not intended to replace advice given to you by your health care provider. Make sure you discuss any questions you have with your health care provider. Document Revised: 10/25/2022 Document Reviewed: 01/12/2022 Elsevier Patient Education  2024 [employer].

## 2023-04-21 NOTE — Progress Notes (Signed)
Pt in clinic today with an 8/10 lower back pain that began after carrying a heavy tray.

## 2023-04-21 NOTE — Progress Notes (Signed)
Subjective:    Patient ID: Isabella Robertson, female    DOB: August 27, 1965, 57 y.o.   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  Denied loss of bowel/bladder control, saddle paresthesias or arm/leg weakness.  Here today for thermacare patch, biofreeze and tylenol from clinic.  Patient stated has had this type of back pain before typically decreases amount lifting at one time and improves/resolves over a couple days.      Review of Systems  Constitutional:  Negative for chills and fever.  HENT:  Negative for trouble swallowing and voice change.   Eyes:  Negative for photophobia and visual disturbance.  Respiratory:  Negative for shortness of breath, wheezing and stridor.   Cardiovascular:  Negative for chest pain.  Gastrointestinal:  Negative for diarrhea, nausea and vomiting.  Genitourinary:  Negative for difficulty urinating, enuresis and flank pain.  Musculoskeletal:  Positive for back pain and myalgias. Negative for gait problem, joint swelling, neck pain and neck stiffness.  Skin:  Negative for color change, rash and wound.  Neurological:  Negative for dizziness, tremors, seizures, syncope, facial asymmetry, speech difficulty, weakness, light-headedness, numbness and headaches.  Hematological:  Negative for adenopathy. Does not bruise/bleed easily.  Psychiatric/Behavioral:  Negative for agitation, confusion and sleep disturbance.        Objective:   Physical Exam Vitals and nursing note reviewed.  Constitutional:      General: She is awake. She is not in acute distress.    Appearance: Normal appearance. She is well-developed, well-groomed and normal weight. She is not ill-appearing, toxic-appearing or diaphoretic.  HENT:     Head: Normocephalic and atraumatic.     Jaw: There is normal jaw occlusion.     Salivary  Glands: Right salivary gland is not diffusely enlarged. Left salivary gland is not diffusely enlarged.     Right Ear: Hearing and external ear normal. No decreased hearing noted.     Left Ear: Hearing and external ear normal. No decreased hearing noted.     Nose: Nose normal. No congestion or rhinorrhea.     Mouth/Throat:     Lips: Pink. No lesions.     Mouth: Mucous membranes are moist. No oral lesions or angioedema.     Dentition: No gum lesions.     Pharynx: Oropharynx is clear.  Eyes:     General: Lids are normal. Vision grossly intact. Gaze aligned appropriately. Allergic shiner present. No visual field deficit or scleral icterus.       Right eye: No discharge.        Left eye: No discharge.     Extraocular Movements: Extraocular movements intact.     Conjunctiva/sclera: Conjunctivae normal.     Pupils: Pupils are equal, round, and reactive to light.  Neck:     Trachea: Trachea and phonation normal.  Cardiovascular:     Rate and Rhythm: Normal rate and regular rhythm.     Pulses: Normal pulses.          Radial pulses are 2+ on the right side and 2+ on the left side.  Pulmonary:     Effort: Pulmonary effort is normal. No respiratory distress.     Breath sounds: Normal breath sounds and air entry. No stridor or transmitted upper airway sounds. No wheezing.     Comments: Spoke full sentences without difficulty; no cough  observed in exam room Abdominal:     Palpations: Abdomen is soft.  Musculoskeletal:        General: Tenderness and signs of injury present. No swelling or deformity.     Right hand: Normal strength. Normal capillary refill.     Left hand: Normal strength. Normal capillary refill.     Cervical back: Normal range of motion and neck supple. No swelling, edema, deformity, erythema, signs of trauma, lacerations, rigidity, spasms, torticollis, tenderness, bony tenderness or crepitus. No pain with movement or muscular tenderness. Normal range of motion.     Thoracic back:  No swelling, edema, deformity, signs of trauma, lacerations, spasms, tenderness or bony tenderness. Decreased range of motion. No scoliosis.     Lumbar back: Tenderness present. No swelling, edema, deformity, signs of trauma, lacerations, spasms or bony tenderness. Decreased range of motion. Negative right straight leg raise test and negative left straight leg raise test. No scoliosis.     Right lower leg: No edema.     Left lower leg: No edema.     Comments: Able to touch knees with hands forward flexions; winces with bilateral thoracic/lumbar rotation greater than 35 degrees; lateral bending full thoracic/lumbar without pain worsening; lumbar extension does not worsen pain; standing to sitting without difficulty and reverse without assist; sitting to lying slower due to discomfort; lying supine does not worsen pain; negative straight leg raise bilateral; normal heel toe gait in exam room  Lymphadenopathy:     Head:     Right side of head: No submandibular or preauricular adenopathy.     Left side of head: No submandibular or preauricular adenopathy.     Cervical: No cervical adenopathy.     Right cervical: No superficial cervical adenopathy.    Left cervical: No superficial cervical adenopathy.  Skin:    General: Skin is warm and dry.     Capillary Refill: Capillary refill takes less than 2 seconds.     Coloration: Skin is not ashen, cyanotic, jaundiced, mottled, pale or sallow.     Findings: No abrasion, abscess, acne, bruising, burn, ecchymosis, erythema, signs of injury, laceration, lesion, petechiae, rash or wound.     Nails: There is no clubbing.     Comments: Face/neck/arms without clothing  Neurological:     General: No focal deficit present.     Mental Status: She is alert and oriented to person, place, and time. Mental status is at baseline.     GCS: GCS eye subscore is 4. GCS verbal subscore is 5. GCS motor subscore is 6.     Cranial Nerves: No cranial nerve deficit, dysarthria or  facial asymmetry.     Sensory: Sensation is intact.     Motor: Motor function is intact. No weakness, tremor, atrophy, abnormal muscle tone or seizure activity.     Coordination: Coordination is intact. Coordination normal.     Gait: Gait is intact. Gait normal.     Deep Tendon Reflexes: Reflexes normal.     Reflex Scores:      Brachioradialis reflexes are 2+ on the right side and 2+ on the left side.      Patellar reflexes are 2+ on the right side and 2+ on the left side.    Comments: In/out of chair and on/off exam table without difficulty; gait sure and steady in clinic; bilateral hand grasp equal 5/5  Psychiatric:        Attention and Perception: Attention and perception normal.  Mood and Affect: Mood and affect normal.        Speech: Speech normal.        Behavior: Behavior normal. Behavior is cooperative.        Thought Content: Thought content normal.        Cognition and Memory: Cognition and memory normal.        Judgment: Judgment normal.      Patient with bilateral SI joint TTP and pain at waistline without palpation no worsening with palpation and no spasms palpated or defects; forward flexion hands to knees; rotation bilateral pain worse than lateral bending; straight leg raise negative bilateral normal heel toe gait    Patient seen in [facility] 1500 stated pain improved with heat/tylenol today.  Denied questions or concerns at that time will follow up prn if new or worsening/not resolving symptoms. Assessment & Plan:  A-low back strain initial encounter  P.-notified incident report group work injury today treated in clinic. Able to perform work duties does not need [facility] provider at this time.  RN Chantel or HR to schedule patient with [facility] if unable to perform work duties as this clinic contract limitations prohibit writing work restrictions. Tylenol 1000mg  po qid prn pain 4 UD given to patient from clinic stock.  Biofreeze gel apply QID prn pain 4 UD given to patient  from clinic stock.  Home stretches demonstrated to patient-e.g. thoracic/lumbar arom/hip arom, knee to chest and rock side to side on back. Self massage or professional prn, foam roller use or tennis/racquetball.  Heat/cryotherapy 15 minutes QID prn.  Trial thermacare 1 applied and another given to patient for use tomorrow from clinic stock.  Consider physical therapy referral if no improvement with prescribed therapy from [facility] and/or chiropractic care.  Ensure ergonomics correct desk at work avoid repetitive motions if possible/holding phone/laptop in hand use desk/stand and/or break up lifting items into smaller loads/weights.  Patient was instructed to rest after work consider epsom salt soak this week and ROM exercises.  Activity as tolerated.   Follow up if symptoms persist or worsen especially if loss of bowel/bladder control, arm/leg weakness and/or saddle paresthesias same day with a provider.  Exitcare handout on low back strain and rehab exercises given to patient.  Patient verbalized agreement and understanding of treatment plan and had no further questions at this time.  P2:  Injury Prevention and Fitness.   Thermacare, tylenol, biofreeze, stretches, epsom salt bath

## 2023-05-03 ENCOUNTER — Other Ambulatory Visit: Payer: Self-pay | Admitting: Family Medicine

## 2023-05-03 DIAGNOSIS — K638219 Small intestinal bacterial overgrowth, unspecified: Secondary | ICD-10-CM

## 2023-05-03 DIAGNOSIS — E782 Mixed hyperlipidemia: Secondary | ICD-10-CM

## 2023-05-03 DIAGNOSIS — E559 Vitamin D deficiency, unspecified: Secondary | ICD-10-CM

## 2023-05-04 ENCOUNTER — Telehealth: Payer: Self-pay | Admitting: Family Medicine

## 2023-05-04 NOTE — Telephone Encounter (Signed)
Patient called in and would like her lab orders to be sent to her employer. She stated that the orders can be faxed over to [phone]. She stated that they are needing the orders today or tomorrow in order to draw them. She would like a call when they are sent over. Please advise. Thank you!

## 2023-05-04 NOTE — Telephone Encounter (Signed)
Faxed order, via Epic, to Employee [employer] at fax # [phone] provided by pt.   Spoke with pt notifying her orders were faxed. Pt expresses her thanks.

## 2023-05-05 ENCOUNTER — Other Ambulatory Visit: Payer: Self-pay | Admitting: Family Medicine

## 2023-05-05 ENCOUNTER — Encounter: Payer: Self-pay | Admitting: Registered Nurse

## 2023-05-05 ENCOUNTER — Telehealth: Payer: Self-pay | Admitting: Registered Nurse

## 2023-05-05 ENCOUNTER — Other Ambulatory Visit: Payer: No Typology Code available for payment source

## 2023-05-05 DIAGNOSIS — E782 Mixed hyperlipidemia: Secondary | ICD-10-CM

## 2023-05-05 DIAGNOSIS — E559 Vitamin D deficiency, unspecified: Secondary | ICD-10-CM

## 2023-05-05 DIAGNOSIS — K580 Irritable bowel syndrome with diarrhea: Secondary | ICD-10-CM

## 2023-05-05 DIAGNOSIS — K638219 Small intestinal bacterial overgrowth, unspecified: Secondary | ICD-10-CM

## 2023-05-05 MED ORDER — DICYCLOMINE HCL 10 MG PO CAPS: 10.0000 mg | ORAL_CAPSULE | Freq: Two times a day (BID) | ORAL | 0 refills | Status: DC | PRN

## 2023-05-05 NOTE — Telephone Encounter (Signed)
Patient requested refill dicyclomine 10mg  po BID prn pain abdomen.  Last filled 09/14/22 60 capsules  working well for patient denied new questions or concerns.  Established with pcm  electronic refill rx sent to her pharmacy of choice #[id number]

## 2023-05-05 NOTE — Progress Notes (Signed)
Pt in today for lab draw. Pt tolerated well. Venipuncture x1.

## 2023-05-06 ENCOUNTER — Other Ambulatory Visit: Payer: No Typology Code available for payment source

## 2023-05-06 LAB — SPECIMEN STATUS REPORT

## 2023-05-09 LAB — COMPREHENSIVE METABOLIC PANEL
ALT: 21 IU/L (ref 0–32)
AST: 25 [IU]/L (ref 0–40)
Albumin: 4.9 g/dL (ref 3.8–4.9)
Alkaline Phosphatase: 70 IU/L (ref 44–121)
BUN/Creatinine Ratio: 14 (ref 9–23)
BUN: 13 mg/dL (ref 6–24)
Bilirubin Total: 1 mg/dL (ref 0.0–1.2)
CO2: 25 mmol/L (ref 20–29)
Calcium: 9.6 mg/dL (ref 8.7–10.2)
Chloride: 100 mmol/L (ref 96–106)
Creatinine, Ser: 0.9 mg/dL (ref 0.57–1.00)
Globulin, Total: 3.2 g/dL (ref 1.5–4.5)
Glucose: 85 mg/dL (ref 70–99)
Potassium: 4.5 mmol/L (ref 3.5–5.2)
Sodium: 138 mmol/L (ref 134–144)
Total Protein: 8.1 g/dL (ref 6.0–8.5)
eGFR: 75 mL/min/{1.73_m2} (ref >59)

## 2023-05-09 LAB — CBC WITH DIFFERENTIAL/PLATELET
Basophils Absolute: 0.1 10*3/uL (ref 0.0–0.2)
Basos: 2 %
EOS (ABSOLUTE): 0.3 10*3/uL (ref 0.0–0.4)
Eos: 9 %
Hematocrit: 46.3 % (ref 34.0–46.6)
Hemoglobin: 15.1 g/dL (ref 11.1–15.9)
Immature Grans (Abs): 0 10*3/uL (ref 0.0–0.1)
Immature Granulocytes: 0 %
Lymphocytes Absolute: 1.4 10*3/uL (ref 0.7–3.1)
Lymphs: 42 %
MCH: 30.3 pg (ref 26.6–33.0)
MCHC: 32.6 g/dL (ref 31.5–35.7)
MCV: 93 fL (ref 79–97)
Monocytes Absolute: 0.3 10*3/uL (ref 0.1–0.9)
Monocytes: 8 %
Neutrophils Absolute: 1.4 10*3/uL (ref 1.4–7.0)
Neutrophils: 39 %
Platelets: 288 10*3/uL (ref 150–450)
RBC: 4.98 x10E6/uL (ref 3.77–5.28)
RDW: 12.6 % (ref 11.7–15.4)
WBC: 3.4 10*3/uL (ref 3.4–10.8)

## 2023-05-10 LAB — LIPID PANEL W/O CHOL/HDL RATIO
Cholesterol, Total: 271 mg/dL — ABNORMAL HIGH (ref 100–199)
HDL: 50 mg/dL (ref >39)
LDL Chol Calc (NIH): 188 mg/dL — ABNORMAL HIGH (ref 0–99)
Triglycerides: 179 mg/dL — ABNORMAL HIGH (ref 0–149)
VLDL Cholesterol Cal: 33 mg/dL (ref 5–40)

## 2023-05-10 LAB — VITAMIN D 25 HYDROXY (VIT D DEFICIENCY, FRACTURES): Vit D, 25-Hydroxy: 55.4 ng/mL (ref 30.0–100.0)

## 2023-05-10 LAB — SPECIMEN STATUS REPORT

## 2023-05-13 ENCOUNTER — Ambulatory Visit (INDEPENDENT_AMBULATORY_CARE_PROVIDER_SITE_OTHER): Payer: No Typology Code available for payment source | Admitting: Family Medicine

## 2023-05-13 ENCOUNTER — Encounter: Payer: Self-pay | Admitting: Family Medicine

## 2023-05-13 VITALS — BP 118/70 | HR 90 | Temp 98.1°F | Ht 60.75 in | Wt 144.2 lb

## 2023-05-13 DIAGNOSIS — K2100 Gastro-esophageal reflux disease with esophagitis, without bleeding: Secondary | ICD-10-CM

## 2023-05-13 DIAGNOSIS — E782 Mixed hyperlipidemia: Secondary | ICD-10-CM | POA: Diagnosis not present

## 2023-05-13 DIAGNOSIS — Z8711 Personal history of peptic ulcer disease: Secondary | ICD-10-CM | POA: Diagnosis not present

## 2023-05-13 DIAGNOSIS — Z0000 Encounter for general adult medical examination without abnormal findings: Secondary | ICD-10-CM | POA: Diagnosis not present

## 2023-05-13 DIAGNOSIS — E559 Vitamin D deficiency, unspecified: Secondary | ICD-10-CM

## 2023-05-13 DIAGNOSIS — K580 Irritable bowel syndrome with diarrhea: Secondary | ICD-10-CM | POA: Diagnosis not present

## 2023-05-13 DIAGNOSIS — N6019 Diffuse cystic mastopathy of unspecified breast: Secondary | ICD-10-CM

## 2023-05-13 MED ORDER — VITAMIN D3 25 MCG (1000 UT) PO CAPS: 1.0000 | ORAL_CAPSULE | Freq: Every day | ORAL | Status: AC

## 2023-05-13 MED ORDER — ROSUVASTATIN CALCIUM 10 MG PO TABS: 10.0000 mg | ORAL_TABLET | Freq: Every day | ORAL | 4 refills | Status: DC

## 2023-05-13 NOTE — Patient Instructions (Addendum)
Ordenare tomografia de corazon con nivel de [employer] (outpatient imaging).  Comienze vitamina D 1000 unidades diarias.  Comienze rosuvastatina (Crestor) 10mg  diarios (de noche).  Gusto verla hoy Repita laboratorios en ayunas en 3 meses para ver efecto de rosuvastatina. Aadiremos otros laboratorios (tiroides y lipoproteina a Oncologist) a Research scientist (physical sciences).  Regresar en 1 ao para proximo examen fisico.

## 2023-05-13 NOTE — Assessment & Plan Note (Signed)
Continue PPI BID 

## 2023-05-13 NOTE — Assessment & Plan Note (Signed)
Continues PPI BID.  

## 2023-05-13 NOTE — Assessment & Plan Note (Addendum)
Chronic, deteriorated off regular statin. Intolerant to statins previously tried (atorvastatin, pravsatatin). Will try rosuvastatin 10mg  daily, recheck FLP in 3 months.  Given fmhx (father MI age 57), will ask to draw Lp(a) next labwork through occupational health at work.  The 10-year ASCVD risk score (Arnett DK, et al., 2019) is: 3%   Values used to calculate the score:     Age: 4 years     Sex: Female     Is Non-Hispanic African American: No     Diabetic: No     Tobacco smoker: No     Systolic Blood Pressure: 118 mmHg     Is BP treated: No     HDL Cholesterol: 50 mg/dL     Total Cholesterol: 271 mg/dL

## 2023-05-13 NOTE — Assessment & Plan Note (Signed)
Completed weekly vit D Rx replacement last month. Restart vit D 1000 international units  daily.

## 2023-05-13 NOTE — Progress Notes (Signed)
Ph: [phone] Fax: [phone]   Patient ID: Isabella Robertson, female    DOB: 06-Jun-1966, 57 y.o.   MRN: [id number]  This visit was conducted in person.  BP 118/70   Pulse 90   Temp 98.1 F (36.7 C) (Oral)   Ht 5' 0.75" (1.543 m)   Wt 144 lb 4 oz (65.4 kg)   SpO2 97%   BMI 27.48 kg/m    CC: CPE Subjective:   HPI: Isabella Robertson is a 57 y.o. female presenting on 05/13/2023 for Annual Exam   Episode of visual disturbance associated with L eye discomfort 04/2021 - brain MR reassuring. Started on aspirin and statin given concern for TIA. Does have h/o migraines. Subsequently saw neurology who diagnosed her with chronic migraine with aura, started on magnesium and riboflavin for prevention and NSAID or aspirin for abortive treatment.   Episode of chest discomfort 03/2023. She did not go to ER.    HLD - on atorvastatin, may have caused joint pains so changed to pravastatin. She takes this about once weekly. She notes some difficulty with swallowing pravastatin pill, ?nausea. Latest LDL 188 - has not been regular with statin.  Father with h/o CAD.   Preventative: Colonoscopy 04/2020 - hemorrhoids, diverticulosis, repeat 10 yrs ([facility]) EGD 04/2020 - normal esophagus, duodenum, gastric mucosal atrophy s/p biopsies showing chronic gastritis and reflux esophagitis ([facility])  Mammogram [facility] @ 10/2022 - L breast mass s/p biopsy showing benign breast with fibrocystic changes.  Well woman exam - s/p hysterectomy 2010 for heavy bleeding ?fibroids, ovaries remain.  DEXA scan - not due yet Lung cancer screening - not eligible Flu shot - declines COVID vaccine Pfizer 09/2019, 10/2019, booster 06/2020  Tetanus - declines, thinks last done 2003  Pneumonia shot - not due Shingrix - discussed, declines. Has not had chicken pox.  Seat belt use discussed  Sunscreen use discussed. No changing moles on skin.  Sleep - averaging 8 hours/night  Non smoker Alcohol -  none Dentist - yearly Eye exam - yearly   Lives with husband and daughter  From [place], [place] Occ: Replacement limited since 2006  Activity: walks regularly at work  Diet: good water intake, fruits/vegetables daily      Relevant past medical, surgical, family and social history reviewed and updated as indicated. Interim medical history since our last visit reviewed. Allergies and medications reviewed and updated. Outpatient Medications Prior to Visit  Medication Sig Dispense Refill   albuterol (VENTOLIN HFA) 108 (90 Base) MCG/ACT inhaler Inhale 1-2 puffs into the lungs every 4 (four) hours as needed for wheezing or shortness of breath. 6.7 g 1   aspirin 81 MG chewable tablet Chew 162 mg by mouth daily as needed for headache.     Cetirizine HCl (ZYRTEC ALLERGY) 10 MG CAPS Take 10 mg by mouth as needed.     Coenzyme Q10 (CO Q-10) 100 MG CAPS Take 1 capsule by mouth daily.  0   dicyclomine (BENTYL) 10 MG capsule Take 1 capsule (10 mg total) by mouth 2 (two) times daily as needed for spasms. 60 capsule 0   fluticasone (FLONASE) 50 MCG/ACT nasal spray Place 1 spray into both nostrils 2 (two) times daily. 16 g 3   Magnesium 400 MG TABS Take 1 tablet by mouth daily.     Menthol, Topical Analgesic, (BIOFREEZE COOL THE PAIN) 4 % GEL Apply 1 Application topically 4 (four) times daily as needed.     omeprazole (PRILOSEC) 40 MG capsule  Take 1 capsule (40 mg total) by mouth 2 (two) times daily. 180 capsule 3   Riboflavin 400 MG TABS Take 1 tablet by mouth daily. 30 tablet    Cholecalciferol (VITAMIN D3) 1.25 MG (50000 UT) TABS Take 1 tablet by mouth once a week. 12 tablet 3   pravastatin (PRAVACHOL) 40 MG tablet Take 1 tablet (40 mg total) by mouth daily. 90 tablet 3   sodium chloride (OCEAN) 0.65 % SOLN nasal spray Place 2 sprays into both nostrils every 2 (two) hours while awake.     No facility-administered medications prior to visit.     Per HPI unless specifically indicated in ROS section  below Review of Systems  Constitutional:  Negative for activity change, appetite change, chills, fatigue, fever and unexpected weight change.  HENT:  Negative for hearing loss.   Eyes:  Negative for visual disturbance.  Respiratory:  Positive for chest tightness (intermittent). Negative for cough, shortness of breath and wheezing.   Cardiovascular:  Negative for chest pain, palpitations and leg swelling.  Gastrointestinal:  Positive for abdominal pain (occ). Negative for abdominal distention, blood in stool, constipation, diarrhea, nausea and vomiting.  Genitourinary:  Negative for difficulty urinating and hematuria.  Musculoskeletal:  Negative for arthralgias, myalgias and neck pain.  Skin:  Negative for rash.  Neurological:  Negative for dizziness, seizures, syncope and headaches.  Hematological:  Negative for adenopathy. Does not bruise/bleed easily.  Psychiatric/Behavioral:  Negative for dysphoric mood. The patient is not nervous/anxious.     Objective:  BP 118/70   Pulse 90   Temp 98.1 F (36.7 C) (Oral)   Ht 5' 0.75" (1.543 m)   Wt 144 lb 4 oz (65.4 kg)   SpO2 97%   BMI 27.48 kg/m   Wt Readings from Last 3 Encounters:  05/13/23 144 lb 4 oz (65.4 kg)  05/10/22 149 lb 9.6 oz (67.9 kg)  09/30/21 142 lb 8 oz (64.6 kg)      Physical Exam Vitals and nursing note reviewed.  Constitutional:      Appearance: Normal appearance. She is not ill-appearing.  HENT:     Head: Normocephalic and atraumatic.     Right Ear: Tympanic membrane, ear canal and external ear normal. There is no impacted cerumen.     Left Ear: Tympanic membrane, ear canal and external ear normal. There is no impacted cerumen.     Mouth/Throat:     Mouth: Mucous membranes are moist.     Pharynx: Oropharynx is clear. No oropharyngeal exudate or posterior oropharyngeal erythema.  Eyes:     General:        Right eye: No discharge.        Left eye: No discharge.     Extraocular Movements: Extraocular movements  intact.     Conjunctiva/sclera: Conjunctivae normal.     Pupils: Pupils are equal, round, and reactive to light.  Neck:     Thyroid: No thyroid mass or thyromegaly.  Cardiovascular:     Rate and Rhythm: Normal rate and regular rhythm.     Pulses: Normal pulses.     Heart sounds: Normal heart sounds. No murmur heard. Pulmonary:     Effort: Pulmonary effort is normal. No respiratory distress.     Breath sounds: Normal breath sounds. No wheezing, rhonchi or rales.  Abdominal:     General: Bowel sounds are normal. There is no distension.     Palpations: Abdomen is soft. There is no mass.     Tenderness: There is  no abdominal tenderness. There is no guarding or rebound.     Hernia: No hernia is present.  Musculoskeletal:     Cervical back: Normal range of motion and neck supple. No rigidity.     Right lower leg: No edema.     Left lower leg: No edema.  Lymphadenopathy:     Cervical: No cervical adenopathy.  Skin:    General: Skin is warm and dry.     Findings: No rash.  Neurological:     General: No focal deficit present.     Mental Status: She is alert. Mental status is at baseline.  Psychiatric:        Mood and Affect: Mood normal.        Behavior: Behavior normal.       Results for orders placed or performed in visit on 05/05/23  Lipid Panel w/o Chol/HDL Ratio  Result Value Ref Range   Cholesterol, Total 271 (H) 100 - 199 mg/dL   Triglycerides 829 (H) 0 - 149 mg/dL   HDL 50 >56 mg/dL   VLDL Cholesterol Cal 33 5 - 40 mg/dL   LDL Chol Calc (NIH) 213 (H) 0 - 99 mg/dL  VITAMIN D 25 Hydroxy (Vit-D Deficiency, Fractures)  Result Value Ref Range   Vit D, 25-Hydroxy 55.4 30.0 - 100.0 ng/mL  Specimen status report  Result Value Ref Range   specimen status report Comment    Lab Results  Component Value Date   NA 138 05/05/2023   CL 100 05/05/2023   K 4.5 05/05/2023   CO2 25 05/05/2023   BUN 13 05/05/2023   CREATININE 0.90 05/05/2023   EGFR 75 05/05/2023   CALCIUM 9.6  05/05/2023   PHOS 3.8 10/20/2020   ALBUMIN 4.9 05/05/2023   GLUCOSE 85 05/05/2023   Lab Results  Component Value Date   ALT 21 05/05/2023   AST 25 05/05/2023   GGT 20 10/20/2020   ALKPHOS 70 05/05/2023   BILITOT 1.0 05/05/2023    Lab Results  Component Value Date   WBC 3.4 05/05/2023   HGB 15.1 05/05/2023   HCT 46.3 05/05/2023   MCV 93 05/05/2023   PLT 288 05/05/2023    Lab Results  Component Value Date   TSH 1.38 05/06/2021    Lab Results  Component Value Date   VITAMINB12 510 05/06/2021   Assessment & Plan:   Problem List Items Addressed This Visit     Health maintenance examination - Primary (Chronic)    Preventative protocols reviewed and updated unless pt declined. Discussed healthy diet and lifestyle.       Diffuse cystic mastopathy    Benign fibrocystic breast changes on recent left breast biopsy 10/2022      Irritable bowel syndrome with diarrhea    Appreciate GI care.  Continues dicyclomine PRN and omeprazole 40mg  BID.       History of gastric ulcer    Continue PPI BID      GERD (gastroesophageal reflux disease)    Continues PPI BID      HLD (hyperlipidemia)    Chronic, deteriorated off regular statin. Intolerant to statins previously tried (atorvastatin, pravsatatin). Will try rosuvastatin 10mg  daily, recheck FLP in 3 months.  Given fmhx (father MI age 42), will ask to draw Lp(a) next labwork through occupational health at work.  The 10-year ASCVD risk score (Arnett DK, et al., 2019) is: 3%   Values used to calculate the score:     Age: 51 years  Sex: Female     Is Non-Hispanic African American: No     Diabetic: No     Tobacco smoker: No     Systolic Blood Pressure: 118 mmHg     Is BP treated: No     HDL Cholesterol: 50 mg/dL     Total Cholesterol: 271 mg/dL       Relevant Medications   rosuvastatin (CRESTOR) 10 MG tablet   Other Relevant Orders   Lipid panel   Comprehensive metabolic panel   TSH   Lipoprotein A (LPA)   Vitamin  D deficiency    Completed weekly vit D Rx replacement last month. Restart vit D 1000 international units  daily.         Meds ordered this encounter  Medications   Cholecalciferol (VITAMIN D3) 25 MCG (1000 UT) CAPS    Sig: Take 1 capsule (1,000 Units total) by mouth daily.   rosuvastatin (CRESTOR) 10 MG tablet    Sig: Take 1 tablet (10 mg total) by mouth daily.    Dispense:  90 tablet    Refill:  4    Orders Placed This Encounter  Procedures   Lipid panel    Standing Status:   Future    Standing Expiration Date:   05/12/2024   Comprehensive metabolic panel    Standing Status:   Future    Standing Expiration Date:   05/12/2024   TSH    Standing Status:   Future    Standing Expiration Date:   05/12/2024   Lipoprotein A (LPA)    Standing Status:   Future    Standing Expiration Date:   05/12/2024    Patient Instructions  Ordenare tomografia de corazon con nivel de calcio en [place] (outpatient imaging).  Comienze vitamina D 1000 unidades diarias.  Comienze rosuvastatina (Crestor) 10mg  diarios (de noche).  Gusto verla hoy Repita laboratorios en ayunas en 3 meses para ver efecto de rosuvastatina. Aadiremos otros laboratorios (tiroides y lipoproteina a Oncologist) a Research scientist (physical sciences).  Regresar en 1 ao para proximo examen fisico.   Follow up plan: Return in about 1 year (around 05/12/2024) for annual exam, prior fasting for blood work.  Eustaquio Boyden, MD

## 2023-05-13 NOTE — Assessment & Plan Note (Signed)
Appreciate GI care.  Continues dicyclomine PRN and omeprazole 40mg  BID.

## 2023-05-13 NOTE — Assessment & Plan Note (Signed)
Preventative protocols reviewed and updated unless pt declined. Discussed healthy diet and lifestyle.  

## 2023-05-13 NOTE — Assessment & Plan Note (Signed)
Benign fibrocystic breast changes on recent left breast biopsy 10/2022

## 2023-07-11 ENCOUNTER — Telehealth: Payer: Self-pay

## 2023-07-11 MED ORDER — ALBUTEROL SULFATE HFA 108 (90 BASE) MCG/ACT IN AERS: 1.0000 | INHALATION_SPRAY | RESPIRATORY_TRACT | 1 refills | Status: DC | PRN

## 2023-07-12 ENCOUNTER — Encounter: Payer: Self-pay | Admitting: Registered Nurse

## 2023-07-12 ENCOUNTER — Ambulatory Visit: Payer: No Typology Code available for payment source | Admitting: Registered Nurse

## 2023-07-12 VITALS — HR 72 | Temp 98.2°F

## 2023-07-12 DIAGNOSIS — J4520 Mild intermittent asthma, uncomplicated: Secondary | ICD-10-CM

## 2023-07-12 NOTE — Patient Instructions (Signed)
Asthma Attack Prevention, Adult Although you may not be able to change the fact that you have asthma, you can take actions to prevent episodes of asthma (asthma attacks). How can this condition affect me? Asthma attacks (flare ups) can cause trouble breathing, high-pitched whistling sounds when you breathe, most often when you breathe out (wheeze), and coughing. They may keep you from doing activities you like to do. What can increase my risk? Coming into contact with things that cause asthma symptoms (asthma triggers) can put you at risk for an asthma attack. Common asthma triggers include: Things you are allergic to (allergens), such as: Dust mite and cockroach droppings. Pet dander. Mold. Pollen from trees and grasses. Food allergies. This might be a specific food or added chemicals called sulfites. Irritants, such as: Weather changes including very cold, dry, or humid air. Smoke. This includes campfire smoke, air pollution, and tobacco smoke. Strong odors from aerosol sprays and fumes from perfume, candles, and household cleaners. Other triggers, such as: Certain medicines. This includes NSAIDs, such as ibuprofen and aspirin. Viral respiratory infections (colds), including runny nose (rhinitis) or infection in the sinuses (sinusitis). Activity, including exercise, laughing, or crying. Not using inhaled medicines (corticosteroids) as told. What actions can I take to prevent an asthma attack? Stay healthy. Stay up to date on all immunizations as told by your health care provider, including the yearly flu (influenza) vaccine and pneumonia vaccine. Many asthma attacks can be prevented by carefully following your written asthma action plan. Follow your asthma action plan Work with your health care provider to create a written asthma action plan. This plan should include: A list of your asthma triggers and how to avoid or reduce them. A list of symptoms that you may have during an asthma  attack. Information about which medicine to take, when to take the medicine, and how much of the medicine to take. Information to help you understand your peak flow measurements. Daily actions that you can take to prevent (control) your asthma symptoms. Contact information for your health care providers. If you have an asthma attack, act quickly. Follow the emergency steps on your written asthma action plan. This may prevent you from needing to go to the hospital. Monitor your asthma. To do this: Use your peak flow meter every morning and every evening for 2-3 weeks or as told by your health care provider. Record the results in a journal. A drop in your peak flow numbers on one or more days may mean that you are starting to have an asthma attack, even if you are not having symptoms. When you have asthma symptoms, write them down in a journal. Write down in your journal how often you need to use your fast-acting rescue inhaler. If you are using your rescue inhaler more often, it may mean that your asthma is not under control. Talk with your health care provider about adjusting your asthma treatment plan to help you prevent future asthma attacks and gain better control of your condition.  Lifestyle Avoid or reduce contact with known outdoor allergens by staying indoors, keeping windows closed, and using air conditioning when pollen and mold counts are high. Do not use any products that contain nicotine or tobacco. These products include cigarettes, chewing tobacco, and vaping devices, such as e-cigarettes. If you need help quitting, ask your health care provider. If you are overweight, consider a weight-loss plan. Find ways to cope with stress and your feelings, such as mindfulness, relaxation, or breathing exercises. Ask your  health care provider if a breathing exercise program (pulmonary rehabilitation) may be helpful to control symptoms and improve your quality of life. Medicines  Take  over-the-counter and prescription medicines only as told by your health care provider. Do not stop taking your medicine and do not take less medicine even if you start to feel better. Let your health care provider know: How often you use your rescue inhaler. How often you have symptoms when you are taking your regular medicines. If you wake up at night because of asthma symptoms. If you have more trouble with your breathing when you exercise. Activity Do your normal activities as told by your health care provider. Ask your health care provider what activities are safe for you. Some people have asthma symptoms or more asthma symptoms when they exercise. This is called exercise-induced bronchoconstriction (EIB). If you have this problem, talk with your health care provider about how to manage EIB. Some tips to follow include: Use your fast-acting inhaler before exercise. Exercise indoors if it is very cold, humid, or the pollen and mold counts are high. Warm up and cool down before and after exercise. Stop exercising right away if your asthma symptoms start or get worse. Where to find more information Asthma and Allergy Foundation of America: www.aafa.org Centers for Disease Control and Prevention: FootballExhibition.com.br American Lung Association: www.lung.org National Heart, Lung, and Blood Institute: PopSteam.is World Health Organization: https://castaneda-walker.com/ Get help right away if: You have followed your written asthma action plan and your symptoms are not improving. Summary Asthma attacks (flare ups) can cause trouble breathing, high-pitched whistling sounds when you breathe, most often when you breathe out (wheeze), and coughing. Work with your health care provider to create a written asthma action plan. Do not stop taking your medicine and do not take less medicine even if you are feeling better. Do not use any products that contain nicotine or tobacco. These products include cigarettes, chewing  tobacco, and vaping devices, such as e-cigarettes. If you need help quitting, ask your health care provider. This information is not intended to replace advice given to you by your health care provider. Make sure you discuss any questions you have with your health care provider. Document Revised: 12/17/2020 Document Reviewed: 12/17/2020 Elsevier Patient Education  2024 [employer].

## 2023-07-12 NOTE — Progress Notes (Signed)
 Subjective:    Patient ID: Isabella Robertson, female    DOB: 30-Jul-1965, 58 y.o.   MRN: [id number]  57y/o hispanic female married established patient needed inhaler after being outside in the cold and noticed it had expired.  Denied daily use/cough/fever/chills/dyspnea/shortness of breath/wheezing.  Feeling well.  History of mild intermittent asthma        Review of Systems  Constitutional:  Negative for chills, diaphoresis, fatigue and fever.  HENT:  Negative for congestion, dental problem, drooling, ear discharge, ear pain, facial swelling, hearing loss, mouth sores, nosebleeds, postnasal drip, rhinorrhea, sinus pressure, sinus pain, sneezing, sore throat, tinnitus, trouble swallowing and voice change.   Eyes:  Negative for photophobia and visual disturbance.  Respiratory:  Negative for cough, choking, chest tightness, shortness of breath, wheezing and stridor.   Cardiovascular:  Negative for chest pain and palpitations.  Gastrointestinal:  Negative for diarrhea, nausea and vomiting.  Genitourinary:  Negative for difficulty urinating.  Musculoskeletal:  Negative for arthralgias, back pain, gait problem, myalgias, neck pain and neck stiffness.  Skin:  Negative for rash.  Neurological:  Negative for tremors, seizures, syncope, speech difficulty, weakness and headaches.  Hematological:  Negative for adenopathy. Does not bruise/bleed easily.  Psychiatric/Behavioral:  Negative for agitation, confusion and sleep disturbance.        Objective:   Physical Exam Vitals reviewed.  Constitutional:      General: She is awake. She is not in acute distress.    Appearance: Normal appearance. She is well-developed and well-groomed. She is not ill-appearing, toxic-appearing or diaphoretic.  HENT:     Head: Normocephalic and atraumatic.     Jaw: There is normal jaw occlusion.     Salivary Glands: Right salivary gland is not diffusely enlarged. Left salivary gland is not diffusely  enlarged.     Right Ear: Hearing and external ear normal.     Left Ear: Hearing and external ear normal.     Nose: Nose normal. No congestion or rhinorrhea.     Mouth/Throat:     Lips: Pink. No lesions.     Mouth: Mucous membranes are moist. No oral lesions or angioedema.     Dentition: No gum lesions.     Tongue: No lesions. Tongue does not deviate from midline.     Palate: No mass and lesions.     Pharynx: Oropharynx is clear. Uvula midline. No pharyngeal swelling, oropharyngeal exudate, posterior oropharyngeal erythema, uvula swelling or postnasal drip.  Eyes:     General: Lids are normal. Vision grossly intact. Gaze aligned appropriately. Allergic shiner present. No scleral icterus.       Right eye: No discharge.        Left eye: No discharge.     Extraocular Movements: Extraocular movements intact.     Conjunctiva/sclera: Conjunctivae normal.     Pupils: Pupils are equal, round, and reactive to light.  Neck:     Trachea: Trachea and phonation normal.  Cardiovascular:     Rate and Rhythm: Normal rate and regular rhythm.     Pulses: Normal pulses.          Radial pulses are 2+ on the right side and 2+ on the left side.     Heart sounds: Normal heart sounds, S1 normal and S2 normal.  Pulmonary:     Effort: Pulmonary effort is normal. No respiratory distress.     Breath sounds: Normal breath sounds and air entry. No stridor, decreased air movement or transmitted upper airway sounds.  No decreased breath sounds, wheezing, rhonchi or rales.     Comments: Spoke full sentences without difficulty; no cough observed in exam room Abdominal:     General: Abdomen is flat.     Palpations: Abdomen is soft.  Musculoskeletal:     Right hand: Normal strength. Normal capillary refill.     Left hand: Normal strength. Normal capillary refill.     Cervical back: Normal range of motion and neck supple. No swelling, edema, deformity, erythema, signs of trauma, lacerations, rigidity, spasms,  torticollis, tenderness or crepitus. No pain with movement or muscular tenderness. Normal range of motion.     Thoracic back: No swelling, edema, deformity, signs of trauma, lacerations, spasms or tenderness. Normal range of motion.     Right lower leg: No edema.     Left lower leg: No edema.  Lymphadenopathy:     Head:     Right side of head: No submandibular or preauricular adenopathy.     Left side of head: No submandibular or preauricular adenopathy.     Cervical: No cervical adenopathy.     Right cervical: No superficial cervical adenopathy.    Left cervical: No superficial cervical adenopathy.  Skin:    General: Skin is warm and dry.     Capillary Refill: Capillary refill takes less than 2 seconds.     Coloration: Skin is not ashen, cyanotic, jaundiced, mottled, pale or sallow.     Findings: No abrasion, abscess, acne, bruising, burn, ecchymosis, erythema, signs of injury, laceration, lesion, petechiae, rash or wound.     Nails: There is no clubbing.     Comments: Face, neck and hands visually inspected  Neurological:     General: No focal deficit present.     Mental Status: She is alert and oriented to person, place, and time. Mental status is at baseline.     GCS: GCS eye subscore is 4. GCS verbal subscore is 5. GCS motor subscore is 6.     Cranial Nerves: No cranial nerve deficit, dysarthria or facial asymmetry.     Motor: Motor function is intact. No weakness, tremor, atrophy, abnormal muscle tone or seizure activity.     Coordination: Coordination is intact. Coordination normal.     Gait: Gait is intact. Gait normal.     Comments: In/out of chair without difficulty; gait sure and steady in clinic; bilateral hand grasp equal 5/5  Psychiatric:        Attention and Perception: Attention and perception normal.        Mood and Affect: Mood and affect normal.        Speech: Speech normal.        Behavior: Behavior normal. Behavior is cooperative.        Thought Content: Thought  content normal.        Cognition and Memory: Cognition and memory normal.        Judgment: Judgment normal.           Assessment & Plan:   A-mild intermittent asthma without complication  P-refilled albuterol  116mcg/act 1-2 puffs po q4-6h prn bronchospasm/protracted coughing/wheezing/chest tightness sent electronically to her pharmacy of choice yesterday see tcon.  Exitcare handout preventing asthma attacks.  Discussed covering face/nose with mask/scarf during freezing/windy winter weather.  Follow up with PCM as needed.  Patient verbalized understanding information/instructions, agreed with plan of care and had no further questions at this time.

## 2023-08-03 ENCOUNTER — Ambulatory Visit
Admission: RE | Admit: 2023-08-03 | Discharge: 2023-08-03 | Disposition: A | Discharge status: Home / Self Care | Payer: No Typology Code available for payment source | Source: Ambulatory Visit | Attending: Family Medicine | Admitting: Family Medicine

## 2023-08-03 ENCOUNTER — Other Ambulatory Visit: Payer: Self-pay | Admitting: Family Medicine

## 2023-08-03 DIAGNOSIS — Z1231 Encounter for screening mammogram for malignant neoplasm of breast: Secondary | ICD-10-CM

## 2023-08-15 ENCOUNTER — Other Ambulatory Visit: Payer: Self-pay | Admitting: Family Medicine

## 2023-08-15 ENCOUNTER — Other Ambulatory Visit: Payer: No Typology Code available for payment source

## 2023-08-17 LAB — COMPREHENSIVE METABOLIC PANEL
ALT: 36 [IU]/L — ABNORMAL HIGH (ref 0–32)
AST: 26 [IU]/L (ref 0–40)
Albumin: 4.4 g/dL (ref 3.8–4.9)
Alkaline Phosphatase: 78 [IU]/L (ref 44–121)
BUN/Creatinine Ratio: 12 (ref 9–23)
BUN: 9 mg/dL (ref 6–24)
Bilirubin Total: 0.6 mg/dL (ref 0.0–1.2)
CO2: 23 mmol/L (ref 20–29)
Calcium: 9.3 mg/dL (ref 8.7–10.2)
Chloride: 101 mmol/L (ref 96–106)
Creatinine, Ser: 0.76 mg/dL (ref 0.57–1.00)
Globulin, Total: 2.7 g/dL (ref 1.5–4.5)
Glucose: 91 mg/dL (ref 70–99)
Potassium: 4.1 mmol/L (ref 3.5–5.2)
Sodium: 141 mmol/L (ref 134–144)
Total Protein: 7.1 g/dL (ref 6.0–8.5)
eGFR: 91 mL/min/{1.73_m2} (ref >59)

## 2023-08-17 LAB — LIPID PANEL W/O CHOL/HDL RATIO
Cholesterol, Total: 154 mg/dL (ref 100–199)
HDL: 52 mg/dL (ref >39)
LDL Chol Calc (NIH): 77 mg/dL (ref 0–99)
Triglycerides: 141 mg/dL (ref 0–149)
VLDL Cholesterol Cal: 25 mg/dL (ref 5–40)

## 2023-08-17 LAB — LIPOPROTEIN A (LPA): Lipoprotein (a): 12.1 nmol/L (ref <75.0)

## 2023-08-17 LAB — TSH: TSH: 1.59 u[IU]/mL (ref 0.450–4.500)

## 2023-08-22 ENCOUNTER — Encounter: Payer: Self-pay | Admitting: Family Medicine

## 2023-10-25 ENCOUNTER — Encounter: Payer: Self-pay | Admitting: Registered Nurse

## 2023-10-25 ENCOUNTER — Telehealth: Payer: Self-pay | Admitting: Registered Nurse

## 2023-10-25 DIAGNOSIS — J301 Allergic rhinitis due to pollen: Secondary | ICD-10-CM

## 2023-10-25 DIAGNOSIS — J4520 Mild intermittent asthma, uncomplicated: Secondary | ICD-10-CM

## 2023-10-25 MED ORDER — MONTELUKAST SODIUM 10 MG PO TABS: 10.0000 mg | ORAL_TABLET | Freq: Every day | ORAL | 3 refills | Status: AC

## 2023-10-27 MED ORDER — SALINE SPRAY 0.65 % NA SOLN
2.0000 | NASAL | Status: AC
Start: 2023-10-27 — End: 2023-11-26

## 2023-10-27 NOTE — Telephone Encounter (Signed)
 Patient came to clinic to discuss had bumps in mouth and dysphagia yesterday evening after eating nuts.  She took her cetirizine  last night and feeling better this morning but concerned. Asked if she took another cetirizine  dose this am and she stated no it makes her too sleepy.  Discussed I would avoid further intake of nuts at this time.  Cobblestoning posterior pharynx and bilateral allergic shiners noted Denied wheezing/tongue swelling/n/v/d/f/c.  BBS CTA, skin warm dry and pink, fine papules noted on hard palate nonerythematous spoke full sentences without difficulty sp02 RA 98% gait sure and steady in clinic.  Patient previously has been on singulair  10mg  po at bedtime prn allergy flare.  PMHx GERD and asthma also.  Electronic Rx sent to her pharmacy of choice #30 RF3 and patient to follow up for re-evaluation with a provider same day if new or worsening symptoms.  Patient agreed with plan of care and had no further questions at this time.  Re-evaluation with NP 10/27/23

## 2023-11-02 NOTE — Telephone Encounter (Signed)
 Follow up with patient in workcenter.  Stated started singulair  and still using albuterol  inhaler typically 2p q6h as feeling short of breath with activity still.  Occasional still having swelling in mouth and dysphagia.  Auscultated lungs BBS CTA; sp02 99% RA oropharynx with cobblestoning no exudate or erythema; skin warm dry and pink respirations even and unlabored spoke full sentences without difficulty gait sure and steady A&Ox3 no cough/congestion/throat clearing observed.  Discussed no fluid/wheezing when auscultated lungs and clear bilaterally all fields.  Continue antihistamine daily 10mg  and singulair  12 hours after antihistamine 10mg  po.  Notify NP if new or worsening symptoms.  Discussed grass and weed pollen still high still noted to have post nasal drip/cobblestoning continue allergy regimen and showering after work/do not sleep with windows open.  May use nasal saline and fluticasone  nasal also still.  Patient verbalized understanding information/instructions,a greed with plan of care and had no further questions at that time.

## 2023-11-17 ENCOUNTER — Telehealth: Payer: Self-pay | Admitting: Registered Nurse

## 2023-11-17 ENCOUNTER — Encounter: Payer: Self-pay | Admitting: Registered Nurse

## 2023-11-17 DIAGNOSIS — K580 Irritable bowel syndrome with diarrhea: Secondary | ICD-10-CM

## 2023-11-17 MED ORDER — DICYCLOMINE HCL 10 MG PO CAPS: 10.0000 mg | ORAL_CAPSULE | Freq: Two times a day (BID) | ORAL | 0 refills | Status: AC | PRN

## 2023-11-17 NOTE — Telephone Encounter (Signed)
 Patient requested refill dicyclomine  10mg  po BID prn bowel spasms.  Hx IBS with diarrhea.  Last filled 60 05/05/23 per bottle from [facility].  Patient denied f/c/v/blood in stool at work today denied concerns.  Electronic refill sent to her pharmacy of choice #60 RF0

## 2023-12-29 ENCOUNTER — Other Ambulatory Visit

## 2023-12-29 DIAGNOSIS — Z0000 Encounter for general adult medical examination without abnormal findings: Secondary | ICD-10-CM

## 2023-12-30 LAB — CMP12+LP+TP+TSH+6AC+CBC/D/PLT
ALT: 21 IU/L (ref 0–32)
AST: 22 IU/L (ref 0–40)
Albumin: 4.6 g/dL (ref 3.8–4.9)
Alkaline Phosphatase: 75 IU/L (ref 44–121)
BUN/Creatinine Ratio: 19 (ref 9–23)
BUN: 15 mg/dL (ref 6–24)
Basophils Absolute: 0.1 10*3/uL (ref 0.0–0.2)
Basos: 2 %
Bilirubin Total: 0.7 mg/dL (ref 0.0–1.2)
Calcium: 9.5 mg/dL (ref 8.7–10.2)
Chloride: 101 mmol/L (ref 96–106)
Chol/HDL Ratio: 4.9 ratio — ABNORMAL HIGH (ref 0.0–4.4)
Cholesterol, Total: 267 mg/dL — ABNORMAL HIGH (ref 100–199)
Creatinine, Ser: 0.81 mg/dL (ref 0.57–1.00)
EOS (ABSOLUTE): 0.3 10*3/uL (ref 0.0–0.4)
Eos: 7 %
Estimated CHD Risk: 1.3 times avg. — ABNORMAL HIGH (ref 0.0–1.0)
Free Thyroxine Index: 2 (ref 1.2–4.9)
GGT: 21 IU/L (ref 0–60)
Globulin, Total: 3.1 g/dL (ref 1.5–4.5)
Glucose: 80 mg/dL (ref 70–99)
HDL: 54 mg/dL (ref >39)
Hematocrit: 45.1 % (ref 34.0–46.6)
Hemoglobin: 14.7 g/dL (ref 11.1–15.9)
Immature Grans (Abs): 0 10*3/uL (ref 0.0–0.1)
Immature Granulocytes: 0 %
Iron: 88 ug/dL (ref 27–159)
LDH: 159 IU/L (ref 119–226)
LDL Chol Calc (NIH): 182 mg/dL — ABNORMAL HIGH (ref 0–99)
Lymphocytes Absolute: 1.9 10*3/uL (ref 0.7–3.1)
Lymphs: 50 %
MCH: 30.6 pg (ref 26.6–33.0)
MCHC: 32.6 g/dL (ref 31.5–35.7)
MCV: 94 fL (ref 79–97)
Monocytes Absolute: 0.3 10*3/uL (ref 0.1–0.9)
Monocytes: 8 %
Neutrophils Absolute: 1.3 10*3/uL — ABNORMAL LOW (ref 1.4–7.0)
Neutrophils: 33 %
Phosphorus: 3.9 mg/dL (ref 3.0–4.3)
Platelets: 266 10*3/uL (ref 150–450)
Potassium: 4.3 mmol/L (ref 3.5–5.2)
RBC: 4.8 x10E6/uL (ref 3.77–5.28)
RDW: 12.4 % (ref 11.7–15.4)
Sodium: 141 mmol/L (ref 134–144)
T3 Uptake Ratio: 29 % (ref 24–39)
T4, Total: 7 ug/dL (ref 4.5–12.0)
TSH: 1.13 u[IU]/mL (ref 0.450–4.500)
Total Protein: 7.7 g/dL (ref 6.0–8.5)
Triglycerides: 169 mg/dL — ABNORMAL HIGH (ref 0–149)
Uric Acid: 5.7 mg/dL (ref 3.0–7.2)
VLDL Cholesterol Cal: 31 mg/dL (ref 5–40)
WBC: 3.8 10*3/uL (ref 3.4–10.8)
eGFR: 85 mL/min/{1.73_m2} (ref >59)

## 2023-12-30 LAB — HGB A1C W/O EAG: Hgb A1c MFr Bld: 5.5 % (ref 4.8–5.6)

## 2024-01-05 ENCOUNTER — Ambulatory Visit: Payer: Self-pay | Admitting: Nurse Practitioner

## 2024-01-17 ENCOUNTER — Telehealth: Payer: Self-pay | Admitting: Registered Nurse

## 2024-01-17 DIAGNOSIS — E7849 Other hyperlipidemia: Secondary | ICD-10-CM

## 2024-01-17 DIAGNOSIS — Z0000 Encounter for general adult medical examination without abnormal findings: Secondary | ICD-10-CM

## 2024-01-17 MED ORDER — ROSUVASTATIN CALCIUM 10 MG PO TABS: 10.0000 mg | ORAL_TABLET | Freq: Every day | ORAL | 3 refills | Status: AC

## 2024-01-18 ENCOUNTER — Encounter: Payer: Self-pay | Admitting: Registered Nurse

## 2024-01-18 NOTE — Telephone Encounter (Signed)
 Patient requested [facility] fill rosuvastatin  10mg  po daily #90 Rf3  dispensed 90 tabs to patient today.    Patient signed HIPAA form, 2026 Be Well ROI and tobacco attestation negative with RN Apolinar.   BP 11/78 LDL 182 Hgba1c 5.5 weight 803oad previous year Be Well 118/64 LDL 131 Hgba1c  5.2 weight 149lbs  NP completed provider portion Be Well paperwork and [employer] form given to HR Wyvonna met requirements for insurance discount starting 04 Apr 2024.  Last labs    Latest Reference Range & Units 12/29/23 08:52  Sodium 134 - 144 mmol/L 141  Potassium 3.5 - 5.2 mmol/L 4.3  Chloride 96 - 106 mmol/L 101  Glucose 70 - 99 mg/dL 80  BUN 6 - 24 mg/dL 15  Creatinine 9.42 - 8.99 mg/dL 9.18  Calcium  8.7 - 10.2 mg/dL 9.5  BUN/Creatinine Ratio 9 - 23  19  eGFR >59 mL/min/1.73 85  Phosphorus 3.0 - 4.3 mg/dL 3.9  Alkaline Phosphatase 44 - 121 IU/L 75  Albumin 3.8 - 4.9 g/dL 4.6  Uric Acid 3.0 - 7.2 mg/dL 5.7  AST 0 - 40 IU/L 22  ALT 0 - 32 IU/L 21  Total Protein 6.0 - 8.5 g/dL 7.7  Total Bilirubin 0.0 - 1.2 mg/dL 0.7  GGT 0 - 60 IU/L 21  Estimated CHD Risk 0.0 - 1.0 times avg. 1.3 (H)  LDH 119 - 226 IU/L 159  Total CHOL/HDL Ratio 0.0 - 4.4 ratio 4.9 (H)  Cholesterol, Total 100 - 199 mg/dL 732 (H)  HDL Cholesterol >39 mg/dL 54  Triglycerides 0 - 850 mg/dL 830 (H)  VLDL Cholesterol Cal 5 - 40 mg/dL 31  LDL Chol Calc (NIH) 0 - 99 mg/dL 817 (H)  Iron 27 - 840 ug/dL 88  Globulin, Total 1.5 - 4.5 g/dL 3.1  WBC 3.4 - 89.1 [id number]/lO 3.8  RBC 3.77 - 5.28 x10E6/uL 4.80  Hemoglobin 11.1 - 15.9 g/dL 85.2  HCT 65.9 - 53.3 % 45.1  MCV 79 - 97 fL 94  MCH 26.6 - 33.0 pg 30.6  MCHC 31.5 - 35.7 g/dL 67.3  RDW 88.2 - 84.5 % 12.4  Platelets 150 - 450 x10E3/uL 266  Neutrophils Not Estab. % 33  Immature Granulocytes Not Estab. % 0  NEUT# 1.4 - 7.0 x10E3/uL 1.3 (L)  Lymphs Abs 0.7 - 3.1 x10E3/uL 1.9  Monocytes Absolute 0.1 - 0.9 x10E3/uL 0.3  Basophils Absolute 0.0 - 0.2 x10E3/uL 0.1  Immature Grans (Abs) 0.0 - 0.1 x10E3/uL  0.0  Lymphs Not Estab. % 50  Monocytes Not Estab. % 8  Basos Not Estab. % 2  Eos Not Estab. % 7  EOS (ABSOLUTE) 0.0 - 0.4 x10E3/uL 0.3  Hemoglobin A1C 4.8 - 5.6 % 5.5  TSH 0.450 - 4.500 uIU/mL 1.130  Thyroxine (T4) 4.5 - 12.0 ug/dL 7.0  Free Thyroxine Index 1.2 - 4.9  2.0  T3 Uptake Ratio 24 - 39 % 29  (H): Data is abnormally high (L): Data is abnormally low  Patient verbalized understanding information/instructions, agreed with plan of care and had no further questions at this time.  Patient previously filling rosuvastatin  at [facility].

## 2024-01-24 NOTE — Telephone Encounter (Signed)
 Patient seen in clinic she is taking rosuvastatin .  [employer] form given to HR Jen met requirements for insurance discount starting 04 Apr 2024 on 01/21/24  Patient had no further questions regarding lab results and epic read receipt results note dated 01/09/24

## 2024-03-02 ENCOUNTER — Encounter: Payer: Self-pay | Admitting: Emergency Medicine

## 2024-03-02 ENCOUNTER — Ambulatory Visit: Admission: EM | Admit: 2024-03-02 | Discharge: 2024-03-02 | Disposition: A | Discharge status: Home / Self Care

## 2024-03-02 DIAGNOSIS — U071 COVID-19: Secondary | ICD-10-CM | POA: Diagnosis not present

## 2024-03-02 NOTE — Discharge Instructions (Signed)
 Your symptoms today are most likely being caused by a virus and should steadily improve in time it can take up to 7 to 10 days before you truly start to see a turnaround however things will get better    You can take Tylenol and/or Ibuprofen as needed for fever reduction and pain relief.   For cough: honey 1/2 to 1 teaspoon (you can dilute the honey in water or another fluid).  You can also use guaifenesin and dextromethorphan for cough. You can use a humidifier for chest congestion and cough.  If you don't have a humidifier, you can sit in the bathroom with the hot shower running.      For sore throat: try warm salt water gargles, cepacol lozenges, throat spray, warm tea or water with lemon/honey, popsicles or ice, or OTC cold relief medicine for throat discomfort.   For congestion: take a daily anti-histamine like Zyrtec, Claritin, and a oral decongestant, such as pseudoephedrine.  You can also use Flonase 1-2 sprays in each nostril daily.   It is important to stay hydrated: drink plenty of fluids (water, gatorade/powerade/pedialyte, juices, or teas) to keep your throat moisturized and help further relieve irritation/discomfort.

## 2024-03-02 NOTE — ED Triage Notes (Signed)
 Patient reports that she had tested positive  or COVID last Tuesday using home test kit. Job is now requesting a return to note work.

## 2024-03-02 NOTE — ED Provider Notes (Signed)
 [facility]    CSN: [id number] Arrival date & time: 03/02/24  1038      History   Chief Complaint Chief Complaint  Patient presents with   Letter for School/Work    HPI Isabella Robertson is a 58 y.o. female.   Patient presents for a return to work note after COVID-19 infection caused her to miss multiple days.  Currently still experiencing congestion and fatigue.  Denies fever.  Past Medical History:  Diagnosis Date   Allergy    Asthma    triggers are dust, smoke, weather changes   Chronic abdominal pain    Diffuse cystic mastopathy    Diverticulitis    E coli infection    abdomen   Endometriosis    History of; Now s/p hysterectomy.   Genetic screening 11/28/2012   negative /[facility]   Helicobacter pylori (H. pylori) infection    x 3 per as of 06/2017    IBS (irritable bowel syndrome)    diarrhea type per pt as of 06/2017     Patient Active Problem List   Diagnosis Date Noted   Plantar fasciitis of left foot 02/26/2022   Vision changes 05/07/2021   Vitamin D  deficiency 10/26/2020   Small intestinal bacterial overgrowth (SIBO) 04/29/2020   Asthma    Allergic rhinitis 10/10/2017   HLD (hyperlipidemia) 10/10/2017   GERD (gastroesophageal reflux disease) 06/24/2017   Chronic abdominal pain 06/24/2017   Chronic migraine w/o aura w/o status migrainosus, not intractable 04/23/2016   Irritable bowel syndrome with diarrhea 02/05/2015   History of Helicobacter pylori infection 02/05/2015   History of gastric ulcer 02/05/2015   Health maintenance examination 02/05/2015   Lipoma 05/14/2013   Family history of malignant neoplasm of breast 11/23/2012   Diffuse cystic mastopathy 11/23/2012    Past Surgical History:  Procedure Laterality Date   ABDOMINAL HYSTERECTOMY  2007   ovaries remain   BREAST BIOPSY Left 10/28/2022   US  LT BREAST BX W LOC DEV 1ST LESION IMG BX SPEC US  GUIDE 10/28/2022 [facility]   CESAREAN SECTION  2003   COLONOSCOPY   2010   COLONOSCOPY  04/2020   hem, diverticulosis rpt 10 yrs [place])   COLONOSCOPY WITH PROPOFOL  N/A 04/08/2017   Procedure: COLONOSCOPY WITH PROPOFOL ;  Surgeon: Gaylyn Gladis PENNER, MD;  Location: [facility] ENDOSCOPY;  Service: Endoscopy;  Laterality: N/A;   ECTOPIC PREGNANCY SURGERY  8006,8005   ESOPHAGOGASTRODUODENOSCOPY  04/2020   chronic gastritis, reflux esophagitis, neg H pylori - rec PPI BID x 12 wks ([place])   ESOPHAGOGASTRODUODENOSCOPY  10/2020   normal esophagus, mild gastropathy, no H pylori, duodenal diverticulum ([place])   EYE SURGERY  2003   HERNIA REPAIR  1971   umbilical   UPPER GASTROINTESTINAL ENDOSCOPY      OB History     Gravida  3   Para      Term      Preterm      AB  2   Living  1      SAB      IAB      Ectopic  2   Multiple      Live Births           Obstetric Comments  1st Menstrual Cycle:  13 1st Pregnancy: 26          Home Medications    Prior to Admission medications   Medication Sig Start Date End Date Taking? Authorizing Provider  albuterol  (VENTOLIN  HFA) 108 (90  Base) MCG/ACT inhaler Inhale 1-2 puffs into the lungs every 4 (four) hours as needed for wheezing or shortness of breath. 07/11/23   Betancourt, Ellouise LABOR, NP  aspirin  81 MG chewable tablet Chew 162 mg by mouth daily as needed for headache.    [provider]  Cetirizine  HCl (ZYRTEC  ALLERGY) 10 MG CAPS Take 10 mg by mouth as needed.    [provider]  Cholecalciferol  (VITAMIN D3) 25 MCG (1000 UT) CAPS Take 1 capsule (1,000 Units total) by mouth daily. 05/13/23   Rilla Baller, MD  Coenzyme Q10 (CO Q-10) 100 MG CAPS Take 1 capsule by mouth daily. 09/30/21   Rilla Baller, MD  dicyclomine  (BENTYL ) 10 MG capsule Take 1 capsule (10 mg total) by mouth 2 (two) times daily as needed for spasms. 11/17/23 11/16/24  Betancourt, Ellouise LABOR, NP  fluticasone  (FLONASE ) 50 MCG/ACT nasal spray Place 1 spray into both nostrils 2 (two) times daily. 03/29/23   Betancourt,  Ellouise LABOR, NP  Magnesium  400 MG TABS Take 1 tablet by mouth daily. 09/30/21   Rilla Baller, MD  Menthol , Topical Analgesic, (BIOFREEZE COOL THE PAIN) 4 % GEL Apply 1 Application topically 4 (four) times daily as needed. 04/21/23   Betancourt, Ellouise LABOR, NP  montelukast  (SINGULAIR ) 10 MG tablet Take 1 tablet (10 mg total) by mouth at bedtime. 10/25/23   Betancourt, Ellouise LABOR, NP  omeprazole  (PRILOSEC) 40 MG capsule Take 1 capsule (40 mg total) by mouth 2 (two) times daily. 08/04/20   Eda Iha, MD  Riboflavin  400 MG TABS Take 1 tablet by mouth daily. 09/30/21   Rilla Baller, MD  rosuvastatin  (CRESTOR ) 10 MG tablet Take 1 tablet (10 mg total) by mouth daily. 05/13/23   Rilla Baller, MD  rosuvastatin  (CRESTOR ) 10 MG tablet Take 1 tablet (10 mg total) by mouth daily. 01/17/24   Betancourt, Tina A, NP  sodium chloride  (OCEAN) 0.65 % SOLN nasal spray Place 2 sprays into both nostrils every 2 (two) hours while awake. 10/27/23 11/26/23  Betancourt, Ellouise LABOR, NP    Family History Family History  Problem Relation Age of Onset   Breast cancer Mother 24   High blood pressure Mother    Hyperlipidemia Mother    Migraines Mother    Heart attack Father 30   High blood pressure Father    Hyperlipidemia Father    Migraines Father    Breast cancer Paternal Aunt    Breast cancer Cousin        breast - paternal side   Colon cancer Neg Hx    Stomach cancer Neg Hx    Esophageal cancer Neg Hx    Pancreatic cancer Neg Hx    Rectal cancer Neg Hx     Social History Social History   Tobacco Use   Smoking status: Never   Smokeless tobacco: Never  Vaping Use   Vaping status: Never Used  Substance Use Topics   Alcohol use: No   Drug use: No     Allergies   Shrimp [shellfish allergy]   Review of Systems Review of Systems   Physical Exam Triage Vital Signs ED Triage Vitals  Encounter Vitals Group     BP 03/02/24 1049 112/80     Girls Systolic BP Percentile --      Girls Diastolic BP  Percentile --      Boys Systolic BP Percentile --      Boys Diastolic BP Percentile --      Pulse Rate 03/02/24 1049 86  Resp 03/02/24 1049 18     Temp 03/02/24 1049 98.6 F (37 C)     Temp Source 03/02/24 1049 Oral     SpO2 03/02/24 1049 98 %     Weight --      Height --      Head Circumference --      Peak Flow --      Pain Score 03/02/24 1046 0     Pain Loc --      Pain Education --      Exclude from Growth Chart --    No data found.  Updated Vital Signs BP 112/80 (BP Location: Left Arm)   Pulse 86   Temp 98.6 F (37 C) (Oral)   Resp 18   SpO2 98%   Visual Acuity Right Eye Distance:   Left Eye Distance:   Bilateral Distance:    Right Eye Near:   Left Eye Near:    Bilateral Near:     Physical Exam Constitutional:      Appearance: Normal appearance.  HENT:     Nose: Congestion present.  Eyes:     Extraocular Movements: Extraocular movements intact.  Cardiovascular:     Rate and Rhythm: Normal rate and regular rhythm.     Pulses: Normal pulses.     Heart sounds: Normal heart sounds.  Pulmonary:     Effort: Pulmonary effort is normal.     Breath sounds: Normal breath sounds.  Neurological:     Mental Status: She is alert and oriented to person, place, and time. Mental status is at baseline.      UC Treatments / Results  Labs (all labs ordered are listed, but only abnormal results are displayed) Labs Reviewed - No data to display  EKG   Radiology No results found.  Procedures Procedures (including critical care time)  Medications Ordered in UC Medications - No data to display  Initial Impression / Assessment and Plan / UC Course  I have reviewed the triage vital signs and the nursing notes.  Pertinent labs & imaging results that were available during my care of the patient were reviewed by me and considered in my medical decision making (see chart for details).  COVID-19  Vitals are stable and patient is in no signs of distress  nontoxic-appearing, lungs air to auscultation, stable for outpatient management, symptoms began last Tuesday, no longer experiencing fever, returned to work note given, questioning need for retesting, discussed CDC guidance on possible false positive within 90 days Final Clinical Impressions(s) / UC Diagnoses   Final diagnoses:  COVID-19     Discharge Instructions      Your symptoms today are most likely being caused by a virus and should steadily improve in time it can take up to 7 to 10 days before you truly start to see a turnaround however things will get better    You can take Tylenol  and/or Ibuprofen  as needed for fever reduction and pain relief.   For cough: honey 1/2 to 1 teaspoon (you can dilute the honey in water or another fluid).  You can also use guaifenesin and dextromethorphan for cough. You can use a humidifier for chest congestion and cough.  If you don't have a humidifier, you can sit in the bathroom with the hot shower running.      For sore throat: try warm salt water gargles, cepacol lozenges, throat spray, warm tea or water with lemon/honey, popsicles or ice, or OTC cold relief medicine for throat  discomfort.   For congestion: take a daily anti-histamine like Zyrtec , Claritin , and a oral decongestant, such as pseudoephedrine.  You can also use Flonase  1-2 sprays in each nostril daily.   It is important to stay hydrated: drink plenty of fluids (water, gatorade/powerade/pedialyte, juices, or teas) to keep your throat moisturized and help further relieve irritation/discomfort.    ED Prescriptions   None    PDMP not reviewed this encounter.   Teresa Shelba SAUN[place], [place] 03/02/24 [phone]

## 2024-03-06 ENCOUNTER — Encounter: Payer: Self-pay | Admitting: Registered Nurse

## 2024-03-06 ENCOUNTER — Telehealth: Payer: Self-pay | Admitting: Registered Nurse

## 2024-03-06 DIAGNOSIS — U071 COVID-19: Secondary | ICD-10-CM

## 2024-03-06 NOTE — Telephone Encounter (Signed)
 Patient left message for NP 02/28/24 positive home covid test.  Out of office reply was on NP currently on vacation contact clinic RN at [phone] through 03/04/24   Notiifed that patient was seen UC for return to work note 03/02/24 and first day back at work scheduled 03/06/24

## 2024-03-12 NOTE — Telephone Encounter (Signed)
 Patient left message work went okay first day back denied questions or concerns.  Feeling a little tired today Wednesday at work.

## 2024-03-27 NOTE — Telephone Encounter (Signed)
 Patient reported still having some cough.  Sp02 99% RA intermittent monitoring RR 16 no cough/congestion nasal/throat clearing observed in workcenter.  BBS CTA skin warm dry and pink spoke full sentences without difficulty gait sure and steady

## 2024-04-02 ENCOUNTER — Ambulatory Visit: Admitting: General Practice

## 2024-04-02 NOTE — Progress Notes (Signed)
 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 States dx with COVID -19 two weeks ago, cough hasn't left, appears to be worse. States having thick, 'heavy' phlegm that is brown and green colored when coughed up. 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 States SOB when laying flat in bed. Denies SOB while sitting or still, states SOB doesn't impede her ability to work. Reports going to [facility] 2 weeks ago and may have been exposed to COVID again, but isn't sure. Will schedule an appt with the NP for tomorrow due to continuing SOB and productive cough.

## 2024-04-03 ENCOUNTER — Encounter: Payer: Self-pay | Admitting: Registered Nurse

## 2024-04-03 ENCOUNTER — Ambulatory Visit: Admitting: Registered Nurse

## 2024-04-03 VITALS — BP 111/68 | HR 88 | Temp 97.2°F | Resp 18

## 2024-04-03 DIAGNOSIS — R221 Localized swelling, mass and lump, neck: Secondary | ICD-10-CM

## 2024-04-03 DIAGNOSIS — R051 Acute cough: Secondary | ICD-10-CM

## 2024-04-03 DIAGNOSIS — J4520 Mild intermittent asthma, uncomplicated: Secondary | ICD-10-CM

## 2024-04-03 DIAGNOSIS — J301 Allergic rhinitis due to pollen: Secondary | ICD-10-CM

## 2024-04-03 MED ORDER — ALBUTEROL SULFATE HFA 108 (90 BASE) MCG/ACT IN AERS: 1.0000 | INHALATION_SPRAY | RESPIRATORY_TRACT | 0 refills | Status: AC | PRN

## 2024-04-03 MED ORDER — ACETAMINOPHEN 500 MG PO TABS: 1000.0000 mg | ORAL_TABLET | Freq: Four times a day (QID) | ORAL | Status: AC | PRN

## 2024-04-03 MED ORDER — BENZONATATE 200 MG PO CAPS: 200.0000 mg | ORAL_CAPSULE | Freq: Two times a day (BID) | ORAL | 1 refills | Status: DC | PRN

## 2024-04-03 MED ORDER — PREDNISONE 10 MG PO TABS: ORAL_TABLET | ORAL | Status: AC
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
## 2024-04-03 NOTE — Telephone Encounter (Signed)
 See office note worsening cough/productive over weekend seen 04/03/24

## 2024-04-03 NOTE — Progress Notes (Signed)
 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 While checking VS, questioned of any meds taken for headache and facial pain. States took one dose of Advil  and throat swelled, couldn't swallow. Ibuprofen  added to allergy list, recommended to no longer take Advil  or any related products.

## 2024-04-03 NOTE — Patient Instructions (Addendum)
 Alergia a los medicamentos: Qu debe saber Drug Allergy: What to Know La alergia a los medicamentos ocurre cuando el organismo reacciona mal a un medicamento. La reaccin puede ser leve o muy grave. En algunos casos, puede ser potencialmente mortal. Los sntomas de una reaccin a menudo ocurren entre 1 y 2 horas despus de Designer, industrial/product. Pero a veces pueden tardar ignacia prima o ms en aparecer. Si usted tiene una reaccin alrgica, obtenga ayuda de inmediato. Debe obtener ayuda aunque la reaccin parezca leve. Cules son las causas? La alergia a los medicamentos es causada por una reaccin en el sistema de defensa del cuerpo, tambin llamado sistema inmunitario. Hace que el cuerpo libere sustancias que pueden causar hinchazn. Esto puede reducir el flujo sanguneo en partes del cuerpo. Cules son los signos o sntomas? Sntomas de una reaccin leve Hinchazn de los senos paranasales. Los senos paranasales son espacios detrs de los huesos de la cara y Armed forces technical officer. Hormigueo en la boca. Erupcin cutnea de color rojo que causa picazn. Sntomas de burkina faso reaccin muy grave Hinchazn del rostro, los labios, la lengua o la boca. Tambin puede tener: Hinchazn de la parte posterior de la garganta. Dificultad para hablar o tragar. Silbidos agudos al respirar. Esto se conoce como sibilancia. Ronchas. Estas son reas de la piel hinchadas, rojas y que pican. Mareos o [employer]. Confusin. Sensacin de opresin en el pecho. Latidos cardacos acelerados o irregulares. Dolor en el vientre. Vmitos o heces acuosas (diarrea). Cmo se trata? No hay cura para las alergias. Las reacciones muy graves deben tratarse de inmediato en un hospital. ignacia reaccin alrgica se puede tratar con lo siguiente: Medicamentos para [employer]. Inhaladores. Estos son medicamentos para ayudar a abrir las vas respiratorias de los pulmones. Una inyeccin para una reaccin alrgica muy grave. Este medicamento  se denomina epinefrina. Su mdico le podr ensear cmo usar un kit de Programmer, multimedia y cmo aplicarse una inyeccin usted mismo. Es posible que le administren un Management consultant. Este es un dispositivo lleno de medicamento que se usa  para Contractor una inyeccin. Siga estas instrucciones en su casa: Si tiene una alergia muy grave:  Siempre lleve consigo un lpiz para la Programmer, multimedia o su kit. Esto podra salvarle la vida. Utilcelo como se lo haya indicado el mdico. Reemplace el lpiz o kit para la alergia inmediatamente despus de usarlo. Use un brazalete o un collar de alerta mdica que indique que es alrgico. Asegrese de que usted, las personas que viven con usted y su empleador sepan cmo usar su lpiz o kit para Fish farm manager. Instrucciones generales Evite los medicamentos a los cuales es Best boy. Tome los medicamentos nicamente como se lo hayan indicado. Si le indican medicamentos para tratar Radiation protection practitioner, no conduzca hasta que el [employer] diga que es [place]. Si tiene urticaria o una erupcin cutnea: Use los medicamentos de venta libre como se lo haya indicado el mdico. Aplique paos fros y hmedos sobre la piel. Tome baos o duchas de agua fra. Evite el agua caliente. Si le [employer], pregunte cundo estarn listos sus resultados y cmo obtenerlos. Es posible que Lawyer o ver a su mdico para [employer]. Informe a todos sus mdicos que tiene una alergia a los medicamentos. Tambin informe a sus familiares u otras personas que viven con usted. Comunquese con un mdico si: Piensa que tiene Runner, broadcasting/film/video leve. Tiene sntomas que duran ms de 2 das despus de la reaccin. Tiene sntomas nuevos. Solicite ayuda de  inmediato si: Tuvo que usar su lpiz o kit para Fish farm manager. Tiene sntomas de Runner, broadcasting/film/video muy grave. Estos sntomas pueden Customer service manager. Use su lpiz o kit para la Oncologist se lo hayan indicado. Llame al 911 de  inmediato. No espere a ver si los sntomas desaparecen. No conduzca por sus propios medios [employer]. Esta informacin no tiene Theme park manager el consejo del mdico. Asegrese de hacerle al mdico cualquier pregunta que tenga. Document Revised: 03/31/2023 Document Reviewed: 03/31/2023 Elsevier Patient Education  2024 Elsevier Inc. Cmo realizar un enjuague de senos paranasales How to Perform a Sinus Rinse El enjuague de senos paranasales es un tratamiento para Arboriculturist que se cocos (keeling) islands para limpiar los senos paranasales con una mezcla sin microbios (estril) de agua y sal (solucin salina). Los senos paranasales son espacios llenos de aire que se encuentran en el crneo detrs de los huesos de la cara y la frente. Se abren en la cavidad nasal. Un enjuague de senos paranasales puede ayudar a limpiar la mucosidad, la suciedad, el polvo o el polen de sus fosas nasales. Puede realizar un enjuague de senos paranasales cuando tenga un resfriado, un virus, sntomas de alergia nasal, una infeccin en los senos paranasales o congestin en la nariz o los senos paranasales. Cules son los riesgos? Generalmente, un enjuague de senos paranasales es seguro y [place]. Sin embargo, hay algunos riesgos, entre ellos: Una sensacin de ardor en los senos paranasales. Esto puede suceder si no prepara la solucin salina segn las indicaciones. Asegrese de seguir todas las indicaciones al preparar la solucin salina. Irritacin nasal. Infeccin. Esto puede deberse si los materiales estn sucios o si el agua est contaminada. La infeccin debido a que el agua est contaminada es poco frecuente, pero posible. No haga un enjuague de los senos paranasales si se ha tenido [place] de odo o de portugal, una infeccin en los odos o a un taponamiento de los odos, a menos que se lo haya recomendado el mdico. Materiales necesarios: Solucin salina o polvo. Agua destilada o estril para Engineer, manufacturing con la  solucin salina en polvo. Puede utilizar agua del grifo hervida y luego enfriada. Hierva el agua del grifo durante 5 minutos; enfrela hasta que est tibia. Use dentro de las 24 horas. No use agua del grifo sin hervir para mezclarla con la solucin salina. Lota (Neti pot) o botella para enjuague nasal. Estas liberan la solucin salina dentro de la nariz para que pase a travs de los senos paranasales. Las lotas y las botellas pueden comprarse en la farmacia local, una tienda de alimentos naturales o por Internet. Cmo realizar un enjuague de senos paranasales  Lvese las manos con agua y jabn durante al menos 20 segundos. Use desinfectante para manos si no dispone de france y [place]. Lave su dispositivo de acuerdo con las indicaciones que vienen con el producto y luego squelo. Use la solucin provista con el producto o la que compr por separado en la tienda. Siga las indicaciones de mezclado provistas en el paquete para mezclarla con agua estril o destilada. Llene el dispositivo con la cantidad de solucin salina segn las instrucciones del dispositivo. Prese junto a un lavabo e incline la cabeza a un lado sobre el lavabo. Colquese el pico del dispositivo en la fosa nasal superior (la que est ms cerca del techo). Vierta o presione suavemente la solucin salina dentro de la fosa nasal. El lquido debera drenar de la fosa nasal inferior si  usted no est demasiado congestionado. Mientras realiza el enjuague, respire por la boca [place]. Sople suavemente la nariz para eliminar la mucosidad y la solucin utilizada para el enjuague. Si sopla fuerte podra causar dolor de odo. Gire la cabeza hacia el otro lado y repita en la otra fosa nasal. Limpie y enjuague el dispositivo con agua limpia, y luego deje que el aire lo seque. Consulte al mdico o farmacutico si tiene preguntas sobre cmo realizar un enjuague de senos paranasales. Resumen El enjuague de senos paranasales es un tratamiento para  Arboriculturist, que se cocos (keeling) islands para [employer] senos paranasales con una mezcla estril de agua y sal (solucin salina). Puede realizar un enjuague de senos paranasales cuando tenga un resfriado, un virus, sntomas de alergia nasal, una infeccin en los senos paranasales o congestin en la nariz o los senos paranasales. Generalmente, un enjuague de senos paranasales es seguro y [place]. Siga todas las indicaciones atentamente. Esta informacin no tiene Theme park manager el consejo del mdico. Asegrese de hacerle al mdico cualquier pregunta que tenga. Document Revised: 12/22/2020 Document Reviewed: 12/22/2020 Elsevier Patient Education  2024 Elsevier Inc.Prevencin de ataques de asma, en adultos Asthma Attack Prevention, Adult Aunque tal vez no puede cambiar el hecho de tener asma, puede tomar medidas para prevenir los episodios de asma (ataques de asma). Cmo puede afectarme esta afeccin? Los ataques de asma (exacerbaciones) pueden causar problemas para respirar, silbidos agudos cuando respira, casi siempre al exhalar (sibilancias), y tos. Pueden evitar que haga actividades que Environmental education officer. Qu puede aumentar el riesgo? Entrar en contacto con las sustancias o factores que causan sntomas de asma (factores desencadenantes del asma) puede ponerlo en riesgo de tener un ataque de asma. Los factores desencadenantes del asma frecuentes incluyen los siguientes: Cosas a las que es alrgico (alrgenos), como: caros del polvo y excrementos de cucaracha. Caspa de las [employer] Moho. Polen de los rboles y el pasto. Alergias a los alimentos. Podra tratarse de un alimento especfico o de sustancias qumicas agregadas, llamadas sulfitos. Irritantes, como los siguientes: Cambios climticos, incluidos el aire muy fro, seco o hmedo. Humo. Esto incluye el humo de las fogatas, la contaminacin del aire y el humo del tabaco. Olores fuertes de [place] y vapores de perfumes, velas y  limpiadores domsticos. Otros factores desencadenantes, como los siguientes: Ciertos medicamentos. Entre ellos, se incluyen los antiinflamatorios no esteroideos (AINE), como el ibuprofeno y la aspirina. Infecciones respiratorias virales (resfros), incluido el goteo nasal (rinitis) y una infeccin en los senos nasales (sinusitis). Actividades, incluido el ejercicio, rer o llorar. No usar los medicamentos inhalados (corticoesteroides) como se lo hayan indicado. Qu medidas puedo tomar para prevenir un ataque de asma? Mantngase sano. Est al da con todas las vacunas como se lo haya indicado el mdico, incluida la vacuna anual contra la gripe (influenza) y la vacuna contra la neumona. Muchos ataques de asma se pueden prevenir al seguir cuidadosamente el plan escrito de accin para el asma. Siga el plan de accin para el asma [place]ore con el mdico para crear un plan escrito de accin para el asma. El plan debe incluir lo siguiente: Una lista de los factores desencadenantes del asma y el modo de evitarlos o reducirlos. [employer] de los sntomas que puede tener durante un ataque de asma. Informacin sobre qu medicamento tomar, cundo tomarlo y qu cantidad. Informacin para ayudar a comprender las mediciones de flujo mximo. Medidas diarias que puede tomar para prevenir Passenger transport manager) los sntomas del asma. Informacin de contacto  para sus mdicos. Si tiene un ataque de asma, acte con rapidez. Siga los pasos de emergencia de su plan escrito de accin para el asma. Esto puede hacer que no necesite ir al hospital. Controle su asma. Para hacer esto: Utilice un espirmetro todas las maanas y todas las noches durante 2 a 3 semanas o como se lo haya indicado el mdico. [employer] en un diario. Una disminucin en los valores de las mediciones de flujo mximo durante un da o ms puede significar que est comenzando a Warehouse manager un ataque de asma, aunque no tenga sntomas. Cuando tenga sntomas  del asma, antelos en un diario. Anote en el diario la frecuencia con la que tiene que usar el inhalador de rescate de accin rpida. Si est utilizando el inhalador de rescate con ms frecuencia, tal vez esto signifique que el asma no est bajo control. Hable con el mdico para modificar el plan de tratamiento para controlar el asma, a fin de prevenir ataques de asma en el futuro y Personnel officer un mejor control de la afeccin.  Estilo de vida Evite o reduzca el contacto con los alrgenos habituales del aire [place]; para ello, Surveyor, mining en interiores, mantenga las ventanas cerradas y use aire acondicionado cuando los niveles de polen y moho estn elevados. No consuma ningn producto que contenga nicotina o tabaco. Estos productos incluyen cigarrillos, tabaco para Theatre manager y aparatos de vapeo, como los cigarrillos electrnicos. Si necesita ayuda para dejar de fumar, consulte al mdico. Si tiene sobrepeso, piense en seguir un plan para bajar de peso. Encuentre maneras de lidiar con el estrs y sus sentimientos, como los ejercicios de conciencia plena, relajacin o respiracin. Pregntele al mdico si un programa de ejercicios de respiracin (rehabilitacin pulmonar) puede ser til para controlar sus sntomas y Scientist, clinical (histocompatibility and immunogenetics) su calidad de vida. Medicamentos  Use los medicamentos de venta libre y los recetados solamente como se lo haya indicado el mdico. No deje de tomar los medicamentos ni tome una menor cantidad Baker comience a sentirse Printmaker. Informe al mdico: Con qu frecuencia usa  el inhalador de rescate. Con qu frecuencia tiene sntomas cuando toma los medicamentos habituales. Si los sntomas del asma lo despiertan por la noche. Si tiene ms dificultad para respirar cuando hace ejercicio. Actividad Haga sus actividades normales como se lo haya indicado el mdico. Pregntele al mdico qu actividades son seguras para usted. Algunas personas tienen sntomas de asma o ms sntomas de asma cuando hacen  ejercicio. Esto se denomina broncoconstriccin inducida por el ejercicio (BCIE). Si tiene [employer], hable con el mdico sobre cmo manejar la BCIE. Algunos consejos para seguir: Use el inhalador de accin rpida antes de hacer ejercicio. Haga ejercicio en interiores si el clima est muy fro o hmedo, o si el polen y el moho estn elevados. Precaliente antes de hacer ejercicio y haga movimientos de enfriamiento despus. Deje de hacer ejercicio de inmediato si comienza a tener sntomas de asma o estos empeoran. Dnde buscar ms informacin Asthma and Allergy Foundation of [place] (Fundacin para el Asma y la Alergia de los [place]): www.aafa.org Centers for Disease Control and Prevention (Centros para el Control y la Prevencin de Event organiser): FootballExhibition.com.br American Lung Association (Asociacin Estadounidense del Pulmn): www.lung.org National Heart, Lung, and Blood Institute (Instituto Nacional del [place], los Pulmones y Risk manager): PopSteam.is World Health Organization (Organizacin Mundial de la Salud): https://castaneda-walker.com/ Solicite ayuda de inmediato si: Ha seguido su plan escrito de accin para el asma y los sntomas no mejoran. Resumen Los ataques  de asma (exacerbaciones) pueden causar problemas para respirar, silbidos agudos cuando respira, casi siempre al exhalar (sibilancias), y tos. Colabore con el mdico para crear un plan escrito de accin para el asma. No deje de tomar los medicamentos ni tome una menor cantidad [place] se sienta mejor. No consuma ningn producto que contenga nicotina o tabaco. Estos productos incluyen cigarrillos, tabaco para Theatre manager y aparatos de vapeo, como los cigarrillos electrnicos. Si necesita ayuda para dejar de fumar, consulte al mdico. Esta informacin no tiene Theme park manager el consejo del mdico. Asegrese de hacerle al mdico cualquier pregunta que tenga. Document Revised: 01/08/2021 Document Reviewed: 07/07/2022Rinitis alrgica en  adultos Allergic Rhinitis, Adult  La rinitis alrgica es una reaccin alrgica que afecta la membrana mucosa que se encuentra en la nariz. La membrana mucosa es el tejido que produce mucosidad. Existen dos tipos de rinitis alrgica: Astronomer. A este tipo tambin se le llama "fiebre del heno" y ocurre solo durante ciertas estaciones. Perenne. Este tipo puede ocurrir en cualquier momento del ao. La rinitis alrgica no puede transmitirse de ignacia persona a otra. Esta afeccin puede ser leve, grave o muy grave. Puede aparecer a cualquier edad y se puede superar con los [place]. Cules son las causas? Esta afeccin es causada por alrgenos. Estas son cosas que pueden causar Runner, broadcasting/film/video. Los alrgenos de la rinitis Merchandiser, retail y de la rinitis alrgica perenne pueden ser diferentes. La rinitis alrgica estacional es causada por el polen. El polen puede provenir de los rboles, el pasto y las malezas. La rinitis alrgica perenne puede ser causada por: caros del polvo. Protenas en el pis (orina), la saliva o la caspa de [place]. La caspa son las clulas muertas de la piel de [place]. Humo en general, moho o humo de automviles. Restos de insectos, como las cucarachas, o sus excrementos. Qu incrementa el riesgo? Una persona tiene ms probabilidades de presentar esta afeccin si tiene antecedentes familiares de alergias u otras afecciones relacionadas con las alergias, entre las que se incluyen: Armed forces technical officer. Se trata de irritacin e hinchazn en parte de los ojos y los prpados. Asma. Esta afeccin afecta los pulmones y dificulta la respiracin. Dermatitis atpica o eczema. Se trata de irritacin e hinchazn de la piel a largo plazo (crnica). Alergias a los alimentos. Cules son los signos o sntomas? Los sntomas de esta afeccin incluyen: Tos o estornudos. Taponamiento nasal (congestin nasal), picazn en la nariz o secrecin nasal. Ojos y lagrimeo en los  ojos. Sensacin de mucosidad que gotea por la parte posterior de la garganta (goteo posnasal). Esto puede causar dolor de garganta. Dificultad para dormir. Cansancio. Dolor de cabeza. Cmo se diagnostica? Esta afeccin se puede diagnosticar en funcin de los sntomas, los antecedentes mdicos y un examen fsico. El mdico puede controlar si tiene alguna afeccin relacionada, por ejemplo: Asma. Ojo rojo. Se trata de irritacin e hinchazn de los ojos a causa de la infeccin (conjuntivitis). Infeccin en los odos. Infeccin de las vas respiratorias superiores. Esta es una infeccin de la nariz, la garganta o las vas respiratorias superiores. Tambin puede someterse a estudios para Financial risk analyst qu alrgenos causan los sntomas. Por ejemplo, anlisis cutneos y de [place]. Cmo se trata? No hay cura para esta afeccin, pero el tratamiento puede ayudar a [employer] sntomas. El tratamiento puede incluir: Usar medicamentos que [employer] sntomas de la [place], como los corticoesteroides (antiinflamatorios) y los antihistamnicos. Medicamentos que pueden administrarse por inyeccin, aerosol nasal o pldoras. Evitar los alrgenos que corresponda.  La exposicin repetida a pequeas cantidades de alrgenos para ayudarlo a generar defensas contra los alrgenos (inmunoterapia con alrgenos). Esto se realiza si otros tratamientos no han sido exitosos. Puede incluir lo siguiente: Agricultural engineer. Se trata de medicamentos inyectables que contienen pequeas cantidades de alrgenos. Inmunoterapia sublingual. Esta implica la administracin de pequeas dosis de un medicamento con alrgenos debajo de la lengua. Si estos tratamientos no funcionan, el mdico puede recetarle medicamentos ms nuevos y ms fuertes. Siga estas instrucciones en su casa: Evite los alrgenos Averige a qu es alrgico y evite esos alrgenos. Hay medidas que puede tomar para ayudar a Secretary/administrator: Si tiene Environmental consultant  perennes: Reemplace las alfombras por pisos de [place], baldosas o vinilo. Las alfombras pueden retener la caspa o los pelos de los animales y el polvo. No fume. No permita que fumen en su hogar. Cambie los filtros de la calefaccin y del aire acondicionado al menos una vez al mes. Si tiene Theatre manager, siga estos pasos durante la temporada de alergias: Mantenga las ventanas cerradas la mayor cantidad de [place] posible. Planee actividades al aire libre cuando las concentraciones de polen estn en su nivel ms bajo. Fjese en las concentraciones de polen antes de planificar actividades al [place]ago. Cuando ingrese al interior, [place] de ropa y dese una ducha antes de sentarse en los muebles o la cama. Si tiene una mascota en la casa que produce alrgenos: Mantenga a la mascota fuera del dormitorio. Pase la aspiradora, barra y limpie el polvo con regularidad. Instrucciones generales Use los medicamentos de venta libre y los recetados solo como se lo haya indicado el mdico. Beba suficiente lquido como para Pharmacologist la orina de color amarillo plido. Dnde buscar ms informacin American Academy of Allergy, Asthma & Immunology (Academia Estadounidense de [place], [place] e Inmunologa): (630)540-1107.org Comunquese con un mdico si: Tiene fiebre. Tiene tos que no desaparece. Emite sonidos de silbidos agudos al respirar, ms a menudo al exhalar (sibilancias). Los sntomas lo enlentecen o impiden que haga sus actividades normales [employer]. Solicite ayuda de inmediato si: Le falta el aire. Este sntoma puede Customer service manager. Solicite ayuda de inmediato. Llame al 911. No espere a ver si los sntomas desaparecen. No conduzca por sus propios medios [employer]. Esta informacin no tiene Theme park manager el consejo del mdico. Asegrese de hacerle al mdico cualquier pregunta que tenga. Document Revised: 04/01/2022 Document Reviewed: 04/01/2022 Elsevier Patient Education   2024 [employer]. Elsevier Patient Education  2024 Elsevier Inc.Asthma Attack Prevention, Adult Although you may not be able to change the fact that you have asthma, you can take actions to prevent episodes of asthma (asthma attacks). How can this condition affect me? Asthma attacks (flare ups) can cause trouble breathing, high-pitched whistling sounds when you breathe, most often when you breathe out (wheeze), and coughing. They may keep you from doing activities you like to do. What can increase my risk? Coming into contact with things that cause asthma symptoms (asthma triggers) can put you at risk for an asthma attack. Common asthma triggers include: Things you are allergic to (allergens), such as: Dust mite and cockroach droppings. Pet dander. Mold. Pollen from trees and grasses. Food allergies. This might be a specific food or added chemicals called sulfites. Irritants, such as: Weather changes including very cold, dry, or humid air. Smoke. This includes campfire smoke, air pollution, and tobacco smoke. Strong odors from aerosol sprays and fumes from perfume, candles, and household cleaners. Other triggers,  such as: Certain medicines. This includes NSAIDs, such as ibuprofen  and aspirin . Viral respiratory infections (colds), including runny nose (rhinitis) or infection in the sinuses (sinusitis). Activity, including exercise, laughing, or crying. Not using inhaled medicines (corticosteroids) as told. What actions can I take to prevent an asthma attack? Stay healthy. Stay up to date on all immunizations as told by your health care provider, including the yearly flu (influenza) vaccine and pneumonia vaccine. Many asthma attacks can be prevented by carefully following your written asthma action plan. Follow your asthma action plan Work with your health care provider to create a written asthma action plan. This plan should include: A list of your asthma triggers and how to avoid or  reduce them. A list of symptoms that you may have during an asthma attack. Information about which medicine to take, when to take the medicine, and how much of the medicine to take. Information to help you understand your peak flow measurements. Daily actions that you can take to prevent (control) your asthma symptoms. Contact information for your health care providers. If you have an asthma attack, act quickly. Follow the emergency steps on your written asthma action plan. This may prevent you from needing to go to the hospital. Monitor your asthma. To do this: Use your peak flow meter every morning and every evening for 2-3 weeks or as told by your health care provider. Record the results in a journal. A drop in your peak flow numbers on one or more days may mean that you are starting to have an asthma attack, even if you are not having symptoms. When you have asthma symptoms, write them down in a journal. Write down in your journal how often you need to use your fast-acting rescue inhaler. If you are using your rescue inhaler more often, it may mean that your asthma is not under control. Talk with your health care provider about adjusting your asthma treatment plan to help you prevent future asthma attacks and gain better control of your condition.  Lifestyle Avoid or reduce contact with known outdoor allergens by staying indoors, keeping windows closed, and using air conditioning when pollen and mold counts are high. Do not use any products that contain nicotine or tobacco. These products include cigarettes, chewing tobacco, and vaping devices, such as e-cigarettes. If you need help quitting, ask your health care provider. If you are overweight, consider a weight-loss plan. Find ways to cope with stress and your feelings, such as mindfulness, relaxation, or breathing exercises. Ask your health care provider if a breathing exercise program (pulmonary rehabilitation) may be helpful to control  symptoms and improve your quality of life. Medicines  Take over-the-counter and prescription medicines only as told by your health care provider. Do not stop taking your medicine and do not take less medicine even if you start to feel better. Let your health care provider know: How often you use your rescue inhaler. How often you have symptoms when you are taking your regular medicines. If you wake up at night because of asthma symptoms. If you have more trouble with your breathing when you exercise. Activity Do your normal activities as told by your health care provider. Ask your health care provider what activities are safe for you. Some people have asthma symptoms or more asthma symptoms when they exercise. This is called exercise-induced bronchoconstriction (EIB). If you have this problem, talk with your health care provider about how to manage EIB. Some tips to follow include: Use your fast-acting inhaler  before exercise. Exercise indoors if it is very cold, humid, or the pollen and mold counts are high. Warm up and cool down before and after exercise. Stop exercising right away if your asthma symptoms start or get worse. Where to find more information Asthma and Allergy Foundation of [place]: www.aafa.org Centers for Disease Control and Prevention: FootballExhibition.com.br American Lung Association: www.lung.org National Heart, Lung, and Blood Institute: PopSteam.is World Health Organization: https://castaneda-walker.com/ Get help right away if: You have followed your written asthma action plan and your symptoms are not improving. Summary Asthma attacks (flare ups) can cause trouble breathing, high-pitched whistling sounds when you breathe, most often when you breathe out (wheeze), and coughing. Work with your health care provider to create a written asthma action plan. Do not stop taking your medicine and do not take less medicine even if you are feeling better. Do not use any products that contain  nicotine or tobacco. These products include cigarettes, chewing tobacco, and vaping devices, such as e-cigarettes. If you need help quitting, ask your health care provider. This information is not intended to replace advice given to you by your health care provider. Make sure you discuss any questions you have with your health care provider. Document Revised: 12/17/2020 Document Reviewed: 12/17/2020 Elsevier Patient Education  2024 Else[employer] Inc.Allergic Rhinitis, Adult  Allergic rhinitis is an allergic reaction that affects the mucous membrane inside the nose. The mucous membrane is the tissue that produces mucus. There are two types of allergic rhinitis: Seasonal. This type is also called hay fever and happens only during certain seasons. Perennial. This type can happen at any time of the year. Allergic rhinitis cannot be spread from person to person. This condition can be mild, bad, or very bad. It can develop at any age and may be outgrown. What are the causes? This condition is caused by allergens. These are things that can cause an allergic reaction. Allergens may differ for seasonal allergic rhinitis and perennial allergic rhinitis. Seasonal allergic rhinitis is caused by pollen. Pollen can come from grasses, trees, and weeds. Perennial allergic rhinitis may be caused by: Dust mites. Proteins in a pet's pee (urine), saliva, or dander. Dander is dead skin cells from a pet. Smoke, mold, or car fumes. Remains of or waste from insects such as cockroaches. What increases the risk? You are more likely to develop this condition if you have a family history of allergies or other conditions related to allergies, including: Allergic conjunctivitis. This is irritation and swelling of parts of the eyes and eyelids. Asthma. This condition affects the lungs and makes it hard to breathe. Atopic dermatitis or eczema. This is long term (chronic) irritation and swelling of the skin. Food allergies. What  are the signs or symptoms? Symptoms of this condition include: Sneezing or coughing. A stuffy nose (nasal congestion), itchy nose, or nasal discharge. Itchy eyes and tearing of the eyes. A feeling of mucus dripping down the back of your throat (postnasal drip). This may cause a sore throat. Trouble sleeping. Tiredness. Headache. How is this diagnosed? This condition may be diagnosed with your symptoms, your medical history, and a physical exam. Your health care provider may check for related conditions, such as: Asthma. Pink eye. This is eye swelling and irritation caused by infection (conjunctivitis). Ear infection. Upper respiratory infection. This is an infection in the nose, throat, or upper airways. You may also have tests to find out which allergens cause your symptoms. These may include skin tests or blood tests. How is  this treated? There is no cure for this condition, but treatment can help control symptoms. Treatment may include: Taking medicines that block allergy symptoms, such as corticosteroids (anti-inflammatories) and antihistamines. Medicine may be given as a shot, nasal spray, or pill. Avoiding any allergens. Being exposed again and again to tiny amounts of allergens to help you build a defense against allergens (allergenimmunotherapy). This is done if other treatments have not helped. It may include: Allergy shots. These are injected medicines that have small amounts of an allergen in them. Sublingual immunotherapy. This involves taking small doses of a medicine with an allergen in it under your tongue. If these treatments do not work, your provider may prescribe newer, stronger medicines. Follow these instructions at home: Avoiding allergens Find out what you are allergic to and avoid those allergens. These are some things you can do to help avoid allergens: If you have perennial allergies: Replace carpet with wood, tile, or vinyl flooring. Carpet can trap dander and  dust. Do not smoke. Do not allow smoking in your home Change your heating and air conditioning filters at least once a month. If you have seasonal allergies, take these steps during allergy season: Keep windows closed as much as possible. Plan outdoor activities when pollen counts are lowest. Check pollen counts before you plan outdoor activities When coming indoors, change clothing and shower before sitting on furniture or bedding. If you have a pet in the house that produces allergens: Keep the pet out of the bedroom. Vacuum, sweep, and dust regularly. General instructions Take over-the-counter and prescription medicines only as told by your provider. Drink enough fluid to keep your pee pale yellow. Where to find more information American Academy of Allergy, Asthma & Immunology: aaaai.org Contact a health care provider if: You have a fever. You develop a cough that does not go away. You make high-pitched whistling sounds when you breathe, most often when you breathe out (wheeze). Your symptoms slow you down or stop you from doing your normal activities each day. Get help right away if: You have shortness of breath. This symptom may be an emergency. Get help right away. Call 911. Do not wait to see if the symptoms will go away. Do not drive yourself to the hospital. This information is not intended to replace advice given to you by your health care provider. Make sure you discuss any questions you have with your health care provider. Document Revised: 03/01/2022 Document Reviewed: 03/01/2022 Elsevier Patient Education  2024 Elsevier Inc.How to Perform a Sinus Rinse A sinus rinse is a home treatment that is used to rinse your sinuses with a germ-free (sterile) mixture of salt and water (saline solution). Sinuses are air-filled spaces in your skull that are behind the bones of your face and forehead. They open into your nasal cavity. A sinus rinse can help to clear mucus, dirt, dust, or  pollen from your nasal cavity. You may do a sinus rinse when you have a cold, a virus, nasal allergy symptoms, a sinus infection, or stuffiness in your nose or sinuses. What are the risks? A sinus rinse is generally safe and effective. However, there are a few risks, which include: A burning sensation in your sinuses. This may happen if you do not make the saline solution as directed. Be sure to follow all directions when making the saline solution. Nasal irritation. Infection. This may be from unclean supplies or from contaminated water. Infection from contaminated water is rare, but possible. Do not do a sinus  rinse if you have had ear or nasal surgery, ear infection, or plugged ears, unless recommended by your health care provider. Supplies needed: Saline solution or powder. Distilled or sterile water to mix with saline powder. You may use boiled and cooled tap water. Boil tap water for 5 minutes; cool until it is lukewarm. Use within 24 hours. Do not use regular tap water to mix with the saline solution. Neti pot or nasal rinse bottle. These supplies release the saline solution into your nose and through your sinuses. Neti pots and nasal rinse bottles can be purchased at [employer], a health food store, or online. How to perform a sinus rinse  Wash your hands with soap and water for at least 20 seconds. If soap and water are not available, use hand sanitizer. Wash your device according to the directions that came with the product and then dry it. Use the solution that comes with your product or one that is sold separately in stores. Follow the mixing directions on the package to mix with sterile or distilled water. Fill the device with the amount of saline solution noted in the device instructions. Stand by a sink and tilt your head sideways over the sink. Place the spout of the device in your upper nostril (the one closer to the ceiling). Gently pour or squeeze the saline solution  into your nasal cavity. The liquid should drain out from the lower nostril if you are not too congested. While rinsing, breathe through your open mouth. Gently blow your nose to clear any mucus and rinse solution. Blowing too hard may cause ear pain. Turn your head in the other direction and repeat in your other nostril. Clean and rinse your device with clean water and then air-dry it. Talk with your health care provider or pharmacist if you have questions about how to do a sinus rinse. Summary A sinus rinse is a home treatment that is used to rinse your sinuses with a sterile mixture of salt and water (saline solution). You may do a sinus rinse when you have a cold, a virus, nasal allergy symptoms, a sinus infection, or stuffiness in your nose or sinuses. A sinus rinse is generally safe and effective. Follow all instructions carefully. This information is not intended to replace advice given to you by your health care provider. Make sure you discuss any questions you have with your health care provider. Document Revised: 12/08/2020 Document Reviewed: 12/08/2020 Elsevier Patient Education  2024 Elsevier Inc.Acute Bronchitis, Adult  Acute bronchitis is sudden inflammation of the main airways (bronchi) that come off the windpipe (trachea) in the lungs. The swelling causes the airways to get smaller and make more mucus than normal. This can make it hard to breathe and can cause coughing or noisy breathing (wheezing). Acute bronchitis may last several weeks. The cough may last longer. Allergies, asthma, and exposure to smoke may make the condition worse. What are the causes? This condition can be caused by germs and by substances that irritate the lungs, including: Cold and flu viruses. The most common cause of this condition is the virus that causes the common cold. Bacteria. This is less common. Breathing in substances that irritate the lungs, including: Smoke from cigarettes and other forms of  tobacco. Dust and pollen. Fumes from household cleaning products, gases, or burned fuel. Indoor or outdoor air pollution. What increases the risk? The following factors may make you more likely to develop this condition: A weak body's defense system, also called  the immune system. A condition that affects your lungs and breathing, such as asthma. What are the signs or symptoms? Common symptoms of this condition include: Coughing. This may bring up clear, yellow, or green mucus from your lungs (sputum). Wheezing. Runny or stuffy nose. Having too much mucus in your lungs (chest congestion). Shortness of breath. Aches and pains, including sore throat or chest. How is this diagnosed? This condition is usually diagnosed based on: Your symptoms and medical history. A physical exam. You may also have other tests, including tests to rule out other conditions, such as pneumonia. These tests include: A test of lung function. Test of a mucus sample to look for the presence of bacteria. Tests to check the oxygen level in your blood. Blood tests. Chest X-ray. How is this treated? Most cases of acute bronchitis clear up over time without treatment. Your health care provider may recommend: Drinking more fluids to help thin your mucus so it is easier to cough up. Taking inhaled medicine (inhaler) to improve air flow in and out of your lungs. Using a vaporizer or a humidifier. These are machines that add water to the air to help you breathe better. Taking a medicine that thins mucus and clears congestion (expectorant). Taking a medicine that prevents or stops coughing (cough suppressant). It is not common to take an antibiotic medicine for this condition. Follow these instructions at home:  Take over-the-counter and prescription medicines only as told by your health care provider. Use an inhaler, vaporizer, or humidifier as told by your health care provider. Take two teaspoons (10 mL) of honey  at bedtime to lessen coughing at night. Drink enough fluid to keep your urine pale yellow. Do not use any products that contain nicotine or tobacco. These products include cigarettes, chewing tobacco, and vaping devices, such as e-cigarettes. If you need help quitting, ask your health care provider. Get plenty of rest. Return to your normal activities as told by your health care provider. Ask your health care provider what activities are safe for you. Keep all follow-up visits. This is important. How is this prevented? To lower your risk of getting this condition again: Wash your hands often with soap and water for at least 20 seconds. If soap and water are not available, use hand sanitizer. Avoid contact with people who have cold symptoms. Try not to touch your mouth, nose, or eyes with your hands. Avoid breathing in smoke or chemical fumes. Breathing smoke or chemical fumes will make your condition worse. Get the flu shot every year. Contact a health care provider if: Your symptoms do not improve after 2 weeks. You have trouble coughing up the mucus. Your cough keeps you awake at night. You have a fever. Get help right away if you: Cough up blood. Feel pain in your chest. Have severe shortness of breath. Faint or keep feeling like you are going to faint. Have a severe headache. Have a fever or chills that get worse. These symptoms may represent a serious problem that is an emergency. Do not wait to see if the symptoms will go away. Get medical help right away. Call your local emergency services (911 in the [place] Do not drive yourself to the hospital. Summary Acute bronchitis is inflammation of the main airways (bronchi) that come off the windpipe (trachea) in the lungs. The swelling causes the airways to get smaller and make more mucus than normal. Drinking more fluids can help thin your mucus so it is easier to cough up.  Take over-the-counter and prescription medicines only as told  by your health care provider. Do not use any products that contain nicotine or tobacco. These products include cigarettes, chewing tobacco, and vaping devices, such as e-cigarettes. If you need help quitting, ask your health care provider. Contact a health care provider if your symptoms do not improve after 2 weeks. This information is not intended to replace advice given to you by your health care provider. Make sure you discuss any questions you have with your health care provider. Document Revised: 10/01/2021 Document Reviewed: 10/22/2020 Elsevier Patient Education  2024 [employer].

## 2024-04-10 NOTE — Telephone Encounter (Signed)
 Patient seen in [facility].  Some throat clearing noted.  Sp02 98% RA skin warm dry and pink respirations even and unlabored BBS CTA RR 18  Patient stated maybe using inhaler once a day.  Plans to use when she goes to lunch in the next hour as feeling a little tight and due to coughing.  Using tessalon  pearles am and prior to bedtime helping.  Finished prednisone  taper it did help to decrease mucous production.  Discussed [facility] referral was sent and office requested faxed information today follow up.  If not contacted by office next week call them.  Patient A&Ox3 spoke full sentences without difficulty skin warm dry and pink gait sure and steady.  Patient verbalized understanding information/instructions, agreed with plan of care and had no further questions at this time.  RN Karene contacted [facility] and was instructed to fax office note/referral and demographics sheet.  See copy in paper record at [facility].

## 2024-05-03 ENCOUNTER — Encounter: Payer: Self-pay | Admitting: Registered Nurse

## 2024-05-03 ENCOUNTER — Ambulatory Visit: Admitting: Registered Nurse

## 2024-05-03 VITALS — BP 119/79 | HR 84 | Temp 97.7°F | Resp 16

## 2024-05-03 DIAGNOSIS — W1642XA Fall into unspecified water causing other injury, initial encounter: Secondary | ICD-10-CM

## 2024-05-03 DIAGNOSIS — S83421A Sprain of lateral collateral ligament of right knee, initial encounter: Secondary | ICD-10-CM

## 2024-05-03 MED ORDER — ACETAMINOPHEN 500 MG PO TABS: 1000.0000 mg | ORAL_TABLET | Freq: Four times a day (QID) | ORAL | Status: AC | PRN
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
## 2024-05-03 NOTE — Patient Instructions (Signed)
 Lateral Collateral Knee Ligament Sprain, Phase I Rehab Ask your health care provider which exercises are safe for you. Do exercises exactly as told by your health care provider and adjust them as directed. It is normal to feel mild stretching, pulling, tightness, or discomfort as you do these exercises. Stop right away if you feel sudden pain or your pain gets worse. Do not begin these exercises until told by your health care provider. Stretching and range-of-motion exercise This exercise warms up your muscles and joints and improves the movement and flexibility of your knee. This exercise also helps to relieve pain. Heel slide  Lie on your back with both knees straight. If this causes back discomfort, bend your healthy knee so your foot is flat on the floor. Slowly slide your left / right heel back toward your buttocks. Stop when you feel a gentle stretch in the front of your knee or thigh. Do not try to force the knee back or push through pain on the outside of your injured knee. Hold this position for ___30_______ seconds. Slowly slide your left / right heel back to the starting position. Repeat ___3_______ times. Complete this exercise _____2_____ times a day. Strengthening exercises These exercises build strength and endurance in your knee. Endurance is the ability to use your muscles for a long time, even after they get tired. Straight leg raises This exercise strengthens the muscles in the front of your thigh (quadriceps). Lie on your back with your left / right leg extended and your healthy knee bent. Tense the muscles in the front of your left / right thigh. You should see your kneecap slide up or see increased dimpling just above the knee. Your thigh may even shake a bit. Keep these muscles tight as you raise your leg 4-6 inches (10-15 cm) off the floor. Do not let your knee bend. If you cannot do this exercise without bending your knee, tighten the muscles in the front of your left /  right thigh (quadriceps set), but do not lift your leg. Hold this position for _____30_____ seconds. Keep the thigh muscles tense as you lower your leg. Relax the muscles slowly and completely after each repetition. Repeat _____3_____ times. Complete this exercise __2________ times a day. Hamstring curls  This exercise strengthens the muscles in the back of your thigh and knee (hamstrings). If told by your health care provider, do this exercise while wearing ankle weights. Begin with __no ________ lb / kg weights. Then increase the weight by 1 lb (0.5 kg) increments. Do not wear ankle weights that are more than ___none_______ lb / kg. Lie on your abdomen with your legs straight. This is the prone position. Bend your left / right knee as far as you can without feeling pain. Keep your hips flat against the surface that you are lying on. When you bend your knee, bring your foot straight toward your buttock. Do not let your foot or leg fall in or out. Hold this position for ___30_______ seconds. Slowly lower your leg to the starting position. Repeat _____3_____ times. Complete this exercise _____2_____ times a day. Bridge  This exercise strengthens the muscles in your buttocks and the muscles in the back of your knee (hip extensors). If this exercise is too easy, try doing it with your arms crossed over your chest, or try lifting one leg while your buttocks are up off the floor. Lie on your back on a firm surface with your knees bent and your feet flat on  the floor. Tighten your buttocks muscles and lift your buttocks off the floor until your trunk is level with your thighs. Do not arch your back. You should feel the muscles working in your buttocks and the back of your thighs. If you do not feel a stretch in these muscles, slide your feet 1-2 inches (2.5-5 cm) farther away from your buttocks. Hold this position for ____30______ seconds. Slowly lower your hips to the starting position. Let your  buttocks muscles relax completely between repetitions. Repeat ___3_______ times. Complete this exercise ___2_______ times a day. Heel raise  Stand with your feet shoulder-width apart. Keep your weight spread evenly over the width of your feet while you rise up on your toes. Use a wall or table to steady yourself, but try not to use it much for support. If this exercise is too easy, shift your weight toward your left / right leg until you feel challenged. Hold this position for ____30______ seconds. Slowly lower yourself to the starting position. Repeat ____3______ times. Complete this exercise ____2______ times a day. This information is not intended to replace advice given to you by your health care provider. Make sure you discuss any questions you have with your health care provider. Document Revised: 02/23/2021 Document Reviewed: 02/23/2021 Elsevier Patient Education  2024 [employer].Esguince del ligamento lateral externo de la rodilla Lateral Collateral Knee Ligament Sprain  El ligamento lateral externo (LLE) es una banda resistente de tejido que conecta el fmur al ms pequeo de los huesos de la parte inferior de la pierna. Est ubicado en la parte exterior de la rodilla y ayuda a pharmacologist la estabilidad de la rodilla. Un esguince de LLE es una distensin o un desgarro del LLE. Cules son las causas? Esta afeccin puede ser causada por lo siguiente: Un golpe fuerte y directo en la parte interna de la rodilla. Correr y cambiar de direccin sbitamente. Torcerse la rodilla bruscamente. Qu incrementa el riesgo? Los siguientes factores pueden hacer que sea ms propenso a aeronautical engineer afeccin: Microbiologist de contacto que involucren correr y cambiar de direccin sbitamente, como ftbol o ftbol americano. Practicar deportes que impliquen un riesgo de torcerse la rodilla, como esqu o lucha libre. Tener msculos dbiles. Tener mala coordinacin. Cules son los signos o  los sntomas? Los sntomas de esta afeccin incluyen: Dolor en la parte externa de la rodilla. Un chasquido que se produce en el momento de la lesin. Hinchazn a lo largo de la parte externa de la rodilla. Moretones alrededor de merchandiser, retail. Inestabilidad al pararse, como si la rodilla fuera a doblarse. Dificultad al caminar por superficies irregulares. Adormecimiento en la rodilla. Cmo se diagnostica? Esta afeccin se puede diagnosticar en funcin de lo siguiente: Sus antecedentes mdicos. Un examen fsico. El mdico le palpar el costado de la rodilla y la mover para revisar la estabilidad. Estudios de diagnstico por imgenes, como una radiografa o una resonancia magntica (RM). Cmo se trata? El tratamiento para esta afeccin puede incluir lo siguiente: Dejar la rodilla apoyada sin peso hasta que la hinchazn y chief technology officer se alivien. Levantar (elevar) la rodilla por encima del nivel del corazn. Esto ayuda a building services engineer. Aplicar hielo sobre la rodilla. Esto ayuda a building services engineer. Tomar un antiinflamatorio no esteroideo (AINE), como el ibuprofeno. Esto ayuda a aliviar el dolor y la hinchazn. Usar ignacia rodillera y nvr inc la lesin sana. Usar ignacia rodillera al realizar actividades deportivas. Realizar ejercicios de rehabilitacin (fisioterapia). Ciruga. Esta puede ser necesaria en  los siguientes casos: El ligamento se desgarra por completo. La rodilla est inestable. La rodilla no mejora con otros tratamientos. Siga estas instrucciones en su casa: Si tiene un dispositivo ortopdico extrable: selo como se lo haya indicado el mdico. Quteselo solamente como se lo haya indicado el mdico. Controle todos los das la piel alrededor del dispositivo ortopdico. Informe al mdico acerca de cualquier inquietud. Afljelo si los dedos de los pies se le adormecen, siente hormigueos o se le enfran y se tornan de research officer, trade union. Mantenga limpio el dispositivo  ortopdico. Si el dispositivo ortopdico no es impermeable: No deje que se moje. Cbralo con un envoltorio hermtico cuando tome un bao de inmersin o una ducha. Control del dolor, la rigidez y la hinchazn  Si se lo indican, aplique hielo en la parte externa de la rodilla. Para hacer esto: Si usa  un dispositivo ortopdico desmontable, quteselo segn las indicaciones del mdico. Ponga el hielo en una bolsa plstica. Coloque una toalla entre la piel y la bolsa. Aplique el hielo durante 20 minutos, 2 a 3 veces por da. Retire el hielo si la piel se pone de color rojo brillante. Esto es intel. Si no puede sentir dolor, calor o fro, tiene un mayor riesgo de que se dae la zona. Mueva el pie y los dedos del pie con frecuencia para reducir la rigidez y la hinchazn. Cuando est sentado o acostado, levante (eleve) la rodilla por encima del nivel del corazn. Medicamentos Use los medicamentos de venta libre y los recetados solamente como se lo haya indicado el mdico. Pregntele al mdico si el medicamento recetado: Requiere que evite conducir o usar maquinaria. Puede causarle estreimiento. Es posible que tenga que tomar estas medidas para prevenir o tratar el estreimiento: Beba suficiente lquido como para pharmacologist la orina de color amarillo plido. Usar medicamentos recetados o de sales promotion account executive. Consumir alimentos ricos en fibra, como frijoles, cereales integrales, y frutas y verduras frescas. Limitar el consumo de alimentos ricos en grasa y azcares procesados, como los alimentos fritos o dulces. Actividad Pregntele al mdico cundo puede volver a conducir si tiene un dispositivo ortopdico en la pierna. Haga ejercicio como se lo haya indicado el mdico. No use la pierna lesionada para sostener el peso del cuerpo hasta que el mdico lo autorice. Use muletas y un dispositivo ortopdico como se lo haya indicado el mdico. Retome sus actividades normales segn lo indicado por el  mdico. Pregntele al mdico qu actividades son seguras para usted. Instrucciones generales No consuma ningn producto que contenga nicotina o tabaco. Estos productos incluyen cigarrillos, tabaco para theatre manager y aparatos de vapeo, como los cigarrillos electrnicos. Si necesita ayuda para dejar de fumar, consulte al mdico. Concurra a todas las visitas de seguimiento. Esto es importante. Cmo se previene? Precaliente y elongue adecuadamente antes de hacer actividad fsica. Reljese y elongue despus de hacer actividad fsica. Dele al cuerpo tiempo para saks incorporated perodos de [place] fsica. Asegrese de usar el equipo que sea apto para usted. Tome medidas de seguridad y sea responsable al hacer actividad fsica. Esto ayudar a evitar cadas. Haga por lo menos 150 minutos de ejercicios de intensidad moderada cada semana, como caminar a paso ligero o hacer gimnasia acutica. Mantenga un buen estado fsico, que incluye lo siguiente: Fuerza. Flexibilidad. Buena capacidad cardiovascular. Resistencia. Comunquese con un mdico si: Contina con dolor e hinchazn por 2 a 4 semanas. Sus sntomas empeoran. Siente que la rodilla est inestable o se dobla. Resumen El ligamento lateral externo (LLE)  es una banda resistente de tejido que conecta el fmur al ms pequeo de los huesos de la parte inferior de la pierna. Un esguince de LLE es una distensin o un desgarro del LLE. Esta afeccin puede tratarse de varias maneras, entre ellas dejar la rodilla apoyada, usar un dispositivo ortopdico, tomar medicamentos y someterse a una ciruga. No use la pierna lesionada para sostener el peso del cuerpo hasta que el mdico lo autorice. Use muletas y un dispositivo ortopdico como se lo haya indicado el mdico. Comunquese con un mdico si el dolor y la hinchazn no mejoran en el lapso de 2 a 4 semanas, o si los sntomas empeoran. Esta informacin no tiene theme park manager el consejo del mdico.  Asegrese de hacerle al mdico cualquier pregunta que tenga. Document Revised: 03/16/2021 Document Reviewed: 03/16/2021 Elsevier Patient Education  2024 [employer].

## 2024-05-04 NOTE — Telephone Encounter (Signed)
 Patient seen in clinic 05/03/24 URI symptoms resolved [facility] CTA

## 2024-05-11 NOTE — Telephone Encounter (Signed)
 Patient reported she returned crutches to [employer] earlier this week.  Knee sleeve has been helping.  She has been elevating leg when sitting at work.  Pain decreasing gradually.  Denied limping with ambulation.  Observed in workcenter today gait sure and steady.  Pain with foot planted and twisting motions.  Discussed with patient due to strain of ligament recommended to move foot versus planting foot when needing to adjust body position.  Continue elevation, compression, ice and tylenol  1000mg  po q6h prn pain.  Patient reported she has appt scheduled with PCM for annual physical 07/11/24 was rescheduled by provider from this month.  Allergy referral appt still pending and she plans to contact office next week to schedule as her allergies are still bothering her.  A&Ox3 spoke full sentences without difficulty no audible cough/congestion/throat clearing/wheezing 5 minute conversation in workcenter.  Patient verbalized understanding information/instructions and had no further questions at this time.

## 2024-05-14 ENCOUNTER — Ambulatory Visit: Admitting: General Practice

## 2024-05-14 NOTE — Progress Notes (Signed)
 Employee presented to the clinic this am desires to see clinic NP. Reports since COVID earlier this year, there is shortness of breath all of the time. Was assessed with c/o of congestion by the NP last week (05/10/24), started Prednisone  on 11/9, two pills yesterday and two pills this morning. Reports feeling slightly better since Prednisone  started, using Flonase  and inhaler. Has used humidifier at home a few times.   RN noted employee not in any respiratory distress, speaking without difficulty with no deep or struggling breaths. Auscultated lung fields, anterior and posterior fields are clear, no rales or rhonchi heard. Scheduled employee an appointment with the clinic NP tomorrow am. VS will be taken at check-in.

## 2024-05-15 ENCOUNTER — Ambulatory Visit: Admitting: Registered Nurse

## 2024-05-15 VITALS — BP 108/74 | HR 82 | Temp 97.2°F

## 2024-05-15 DIAGNOSIS — J209 Acute bronchitis, unspecified: Secondary | ICD-10-CM

## 2024-05-15 MED ORDER — PREDNISONE 10 MG PO TABS: ORAL_TABLET | ORAL | Status: AC

## 2024-05-15 MED ORDER — BENZONATATE 200 MG PO CAPS: 200.0000 mg | ORAL_CAPSULE | Freq: Three times a day (TID) | ORAL | 1 refills | Status: DC | PRN

## 2024-05-15 NOTE — Progress Notes (Signed)
 Established Patient Office Visit  Subjective   Patient ID: Isabella Robertson, female    DOB: 04/29/1966  Age: 58 y.o. MRN: [id number]  Chief Complaint  Patient presents with   Cough    58y/o married hispanic female here for re-evaluation dry cough.  Sparingly using inhaler.  Restarted left over prednisone  taper 10mg  took 2 yesterday and this am cough has improved a lot.  After work had noticed wheezing yesterday.  Trying not to use albuterol  inhaler every day.  Unsure if she has any tessalon  pearles left over at home.  Denied fever/chills/n/v/d/hemoptysis.  Allergist appt scheduled for 13 Jul 2024 and has been taking her claritin , singulair , nasal saline and flonase  nasal.    Right knee improving with time and wearing compression sleeve and elevating leg when sitting      Review of Systems  Constitutional:  Negative for chills, diaphoresis, fever and malaise/fatigue.  HENT:  Negative for congestion, ear discharge, ear pain, nosebleeds, sinus pain and sore throat.   Eyes:  Negative for pain, discharge and redness.  Respiratory:  Positive for cough, shortness of breath and wheezing. Negative for hemoptysis, sputum production and stridor.   Cardiovascular:  Negative for chest pain.  Gastrointestinal:  Negative for diarrhea, nausea and vomiting.  Musculoskeletal:  Positive for joint pain. Negative for back pain, myalgias and neck pain.  Skin:  Negative for rash.  Neurological:  Negative for dizziness, speech change, focal weakness, weakness and headaches.  Endo/Heme/Allergies:  Positive for environmental allergies.  Psychiatric/Behavioral:  The patient does not have insomnia.       Objective:     BP 108/74 (BP Location: Left Arm, Patient Position: Sitting, Cuff Size: Normal)   Pulse 82   Temp (!) 97.2 F (36.2 C) (Tympanic)   SpO2 99%    Physical Exam Vitals and nursing note reviewed.  Constitutional:      General: She is awake. She is not in acute distress.     Appearance: Normal appearance. She is well-developed, well-groomed and normal weight. She is not ill-appearing, toxic-appearing or diaphoretic.  HENT:     Head: Normocephalic and atraumatic.     Jaw: There is normal jaw occlusion.     Salivary Glands: Right salivary gland is not diffusely enlarged or tender. Left salivary gland is not diffusely enlarged or tender.     Right Ear: Hearing and external ear normal. No decreased hearing noted. No laceration, drainage, swelling or tenderness. A middle ear effusion is present. There is no impacted cerumen. No foreign body. No mastoid tenderness. No PE tube. No hemotympanum. Tympanic membrane is not injected, scarred, perforated, erythematous, retracted or bulging.     Left Ear: Hearing and external ear normal. No decreased hearing noted. No laceration, drainage, swelling or tenderness. A middle ear effusion is present. There is no impacted cerumen. No foreign body. No mastoid tenderness. No PE tube. No hemotympanum. Tympanic membrane is not injected, scarred, perforated, erythematous, retracted or bulging.     Ears:     Comments: Bilateral TMs intact air fluid level clear; cobblestoning posterior pharynx; bilateral allergic shiners    Nose: Nose normal. No congestion or rhinorrhea.     Right Nostril: No epistaxis.     Left Nostril: No epistaxis.     Right Turbinates: Not enlarged, swollen or pale.     Left Turbinates: Not enlarged, swollen or pale.     Right Sinus: No maxillary sinus tenderness or frontal sinus tenderness.     Left Sinus: No  maxillary sinus tenderness or frontal sinus tenderness.     Mouth/Throat:     Lips: Pink. No lesions.     Mouth: Mucous membranes are moist. No oral lesions or angioedema.     Dentition: No gum lesions.     Tongue: No lesions. Tongue does not deviate from midline.     Palate: No mass and lesions.     Pharynx: Uvula midline. Pharyngeal swelling and postnasal drip present. No oropharyngeal exudate, posterior  oropharyngeal erythema or uvula swelling.     Tonsils: No tonsillar exudate.  Eyes:     General: Lids are normal. Vision grossly intact. Gaze aligned appropriately. Allergic shiner present. No scleral icterus.       Right eye: No discharge.        Left eye: No discharge.     Extraocular Movements: Extraocular movements intact.     Conjunctiva/sclera: Conjunctivae normal.     Pupils: Pupils are equal, round, and reactive to light.  Neck:     Trachea: Trachea and phonation normal.  Cardiovascular:     Rate and Rhythm: Normal rate and regular rhythm.     Pulses: Normal pulses.          Radial pulses are 2+ on the right side and 2+ on the left side.     Heart sounds: Normal heart sounds, S1 normal and S2 normal.  Pulmonary:     Effort: Pulmonary effort is normal. No respiratory distress.     Breath sounds: Normal breath sounds and air entry. No stridor or transmitted upper airway sounds. No decreased breath sounds, wheezing, rhonchi or rales.     Comments: Spoke full sentences without difficulty; no cough observed in exam room; BBS CTA sp02 99% Abdominal:     General: Abdomen is flat.     Palpations: Abdomen is soft.  Musculoskeletal:        General: Normal range of motion.     Right hand: Normal strength. Normal capillary refill.     Left hand: Normal strength. Normal capillary refill.     Cervical back: Normal range of motion and neck supple. No swelling, edema, deformity, erythema, signs of trauma, lacerations, rigidity, spasms, torticollis, tenderness or crepitus. No pain with movement or muscular tenderness. Normal range of motion.     Thoracic back: No swelling, edema, deformity, signs of trauma, lacerations, spasms or tenderness. Normal range of motion.     Right knee: Crepitus present. No swelling, deformity, effusion, erythema, ecchymosis or lacerations. Normal range of motion. No tenderness. No LCL laxity.     Left knee: No swelling, deformity, effusion, erythema, ecchymosis,  lacerations or crepitus. Normal range of motion. No tenderness. No LCL laxity.    Right lower leg: No edema.     Left lower leg: No edema.     Comments: Crepitus right knee with flexion extension AROM and passive; with knee sleeve on no pain with valgus/varus testing and no laxity on exam today  Lymphadenopathy:     Head:     Right side of head: No submental, submandibular, tonsillar, preauricular, posterior auricular or occipital adenopathy.     Left side of head: No submental, submandibular, tonsillar, preauricular, posterior auricular or occipital adenopathy.     Cervical: No cervical adenopathy.     Right cervical: No superficial, deep or posterior cervical adenopathy.    Left cervical: No superficial, deep or posterior cervical adenopathy.  Skin:    General: Skin is warm and dry.     Capillary Refill: Capillary  refill takes less than 2 seconds.     Coloration: Skin is not ashen, cyanotic, jaundiced, mottled, pale or sallow.     Findings: No abrasion, abscess, acne, bruising, burn, ecchymosis, erythema, signs of injury, laceration, lesion, petechiae, rash or wound.     Comments: Anterior face/neck/arms visually inspected  Neurological:     General: No focal deficit present.     Mental Status: She is alert and oriented to person, place, and time. Mental status is at baseline.     GCS: GCS eye subscore is 4. GCS verbal subscore is 5. GCS motor subscore is 6.     Cranial Nerves: No cranial nerve deficit, dysarthria or facial asymmetry.     Motor: Motor function is intact. No weakness, tremor, atrophy, abnormal muscle tone or seizure activity.     Coordination: Coordination is intact. Coordination normal.     Gait: Gait is intact. Gait normal.     Comments: In/out of chair and on/off exam table without difficulty; gait sure and steady in clinic; bilateral hand grasp equal 5/5  Psychiatric:        Attention and Perception: Attention and perception normal.        Mood and Affect: Mood and  affect normal.        Speech: Speech normal.        Behavior: Behavior normal. Behavior is cooperative.        Thought Content: Thought content normal.        Cognition and Memory: Cognition and memory normal.        Judgment: Judgment normal.      No results found for any visits on 05/15/24.    The 10-year ASCVD risk score (Arnett DK, et al., 2019) is: 2.5%    Assessment & Plan:   Problem List Items Addressed This Visit   None Visit Diagnoses       Acute bronchitis, unspecified organism    -  Primary     Discussed most likely asthma exacerbation/bronchitis with weather change hard freeze last night weather swung from high 70s 36 hours ago to 18 with windchill overnight.  Discussed continue use albuterol  inhaler 2 puffs po q4-6 hours prn protracted cough/wheezing/shortness of breath.  Finish low dose prednisone  tapers 20mg x2 days 10mg x2 days 5mg x2 days previously dispensed from [facility].  Continue loratadine  10mg  po daily.  Singulair  10mg  po at bedtime  nasal saline 2 sprays each nostril q2h prn congestion/sinus pain/pressure and flonase  1 spray each nostril BID  Keep initial allergy consult as scheduled 13 Jul 2024 for allergy/asthma testing.  PFT testing not available in this clinic.  Cover mouth/nose with scarf/mask when temperatures under 40 degrees.  Try to limit time out in cold freezing temperatures.  Exitcare handout on bronchitis/asthma triggers.  Discussed with patient BBS CTA at this time  refilled tessalon  pearles 200mg  po TID prn cough #20 RF1 swallow whole do not chew nondrowsy.  Patient stated sleeping okay did not want promethazine liquid. 6.25mg /52ml at bedtime currently.  Patient agreed with plan of care and had no further questions at this time  Knee strain LCL improving continue knee sleeve wear, ice 15 minutes QID prn pain and elevating leg when sitting avoid high impact activities  Gait sure and steady in clinic no limp noted.  Patient reported pain improving with time and  knee sleeve has helped a lot.  Tylenol  1000mg  po q6h prn pain.  Patient agreed with plan of care and had no further questions at this time.  Return if symptoms worsen or fail to improve, for keep scheduled appt with allergist 13 Jul 2024.    Ellouise DELENA Hope, NP

## 2024-05-15 NOTE — Telephone Encounter (Signed)
 See office note 05/15/24  allergy appt 13 Jul 2024  knee improving  restarted prednisone  taper for bronchitis yesterday

## 2024-05-15 NOTE — Patient Instructions (Signed)
 Asthma Triggers and Exacerbation In this video, you will learn about exposures and other factors that can lead to asthma attacks and trouble controlling asthma. To view the content, go to this web address: [email or web address]  This video will expire on: 06/15/2025. If you need access to this video following this date, please reach out to the healthcare provider who assigned it to you. This information is not intended to replace advice given to you by your health care provider. Make sure you discuss any questions you have with your health care provider. Elsevier Patient Education  2024 [employer]Acute Bronchitis, Adult  Acute bronchitis is sudden inflammation of the main airways (bronchi) that come off the windpipe (trachea) in the lungs. The swelling causes the airways to get smaller and make more mucus than normal. This can make it hard to breathe and can cause coughing or noisy breathing (wheezing). Acute bronchitis may last several weeks. The cough may last longer. Allergies, asthma, and exposure to smoke may make the condition worse. What are the causes? This condition can be caused by germs and by substances that irritate the lungs, including: Cold and flu viruses. The most common cause of this condition is the virus that causes the common cold. Bacteria. This is less common. Breathing in substances that irritate the lungs, including: Smoke from cigarettes and other forms of tobacco. Dust and pollen. Fumes from household cleaning products, gases, or burned fuel. Indoor or outdoor air pollution. What increases the risk? The following factors may make you more likely to develop this condition: A weak body's defense system, also called the immune system. A condition that affects your lungs and breathing, such as asthma. What are the signs or symptoms? Common symptoms of this condition include: Coughing. This may bring up clear, yellow, or green mucus from your lungs  (sputum). Wheezing. Runny or stuffy nose. Having too much mucus in your lungs (chest congestion). Shortness of breath. Aches and pains, including sore throat or chest. How is this diagnosed? This condition is usually diagnosed based on: Your symptoms and medical history. A physical exam. You may also have other tests, including tests to rule out other conditions, such as pneumonia. These tests include: A test of lung function. Test of a mucus sample to look for the presence of bacteria. Tests to check the oxygen level in your blood. Blood tests. Chest X-ray. How is this treated? Most cases of acute bronchitis clear up over time without treatment. Your health care provider may recommend: Drinking more fluids to help thin your mucus so it is easier to cough up. Taking inhaled medicine (inhaler) to improve air flow in and out of your lungs. Using a vaporizer or a humidifier. These are machines that add water to the air to help you breathe better. Taking a medicine that thins mucus and clears congestion (expectorant). Taking a medicine that prevents or stops coughing (cough suppressant). It is not common to take an antibiotic medicine for this condition. Follow these instructions at home:  Take over-the-counter and prescription medicines only as told by your health care provider. Use an inhaler, vaporizer, or humidifier as told by your health care provider. Take two teaspoons (10 mL) of honey at bedtime to lessen coughing at night. Drink enough fluid to keep your urine pale yellow. Do not use any products that contain nicotine or tobacco. These products include cigarettes, chewing tobacco, and vaping devices, such as e-cigarettes. If you need help quitting, ask your health care provider. Get plenty of rest. Return  to your normal activities as told by your health care provider. Ask your health care provider what activities are safe for you. Keep all follow-up visits. This is  important. How is this prevented? To lower your risk of getting this condition again: Wash your hands often with soap and water for at least 20 seconds. If soap and water are not available, use hand sanitizer. Avoid contact with people who have cold symptoms. Try not to touch your mouth, nose, or eyes with your hands. Avoid breathing in smoke or chemical fumes. Breathing smoke or chemical fumes will make your condition worse. Get the flu shot every year. Contact a health care provider if: Your symptoms do not improve after 2 weeks. You have trouble coughing up the mucus. Your cough keeps you awake at night. You have a fever. Get help right away if you: Cough up blood. Feel pain in your chest. Have severe shortness of breath. Faint or keep feeling like you are going to faint. Have a severe headache. Have a fever or chills that get worse. These symptoms may represent a serious problem that is an emergency. Do not wait to see if the symptoms will go away. Get medical help right away. Call your local emergency services (911 in the [place] Do not drive yourself to the hospital. Summary Acute bronchitis is inflammation of the main airways (bronchi) that come off the windpipe (trachea) in the lungs. The swelling causes the airways to get smaller and make more mucus than normal. Drinking more fluids can help thin your mucus so it is easier to cough up. Take over-the-counter and prescription medicines only as told by your health care provider. Do not use any products that contain nicotine or tobacco. These products include cigarettes, chewing tobacco, and vaping devices, such as e-cigarettes. If you need help quitting, ask your health care provider. Contact a health care provider if your symptoms do not improve after 2 weeks. This information is not intended to replace advice given to you by your health care provider. Make sure you discuss any questions you have with your health care  provider. Document Revised: 10/01/2021 Document Reviewed: 10/22/2020 Elsevier Patient Education  2024 [employer].

## 2024-05-24 NOTE — Telephone Encounter (Signed)
 Patient reported knee and bronchitis improved denied questions or concerns at this time.  Still waiting for allergy appt and PCM Jan 2026.

## 2024-07-11 ENCOUNTER — Encounter: Admitting: Family Medicine

## 2024-07-16 ENCOUNTER — Ambulatory Visit: Admitting: Family Medicine

## 2024-07-16 ENCOUNTER — Encounter: Payer: Self-pay | Admitting: Family Medicine

## 2024-07-16 VITALS — BP 106/78 | HR 80 | Temp 98.1°F | Ht 60.75 in | Wt 150.0 lb

## 2024-07-16 DIAGNOSIS — J4520 Mild intermittent asthma, uncomplicated: Secondary | ICD-10-CM

## 2024-07-16 DIAGNOSIS — K580 Irritable bowel syndrome with diarrhea: Secondary | ICD-10-CM | POA: Diagnosis not present

## 2024-07-16 DIAGNOSIS — G43709 Chronic migraine without aura, not intractable, without status migrainosus: Secondary | ICD-10-CM

## 2024-07-16 DIAGNOSIS — E559 Vitamin D deficiency, unspecified: Secondary | ICD-10-CM

## 2024-07-16 DIAGNOSIS — E782 Mixed hyperlipidemia: Secondary | ICD-10-CM

## 2024-07-16 DIAGNOSIS — Z0001 Encounter for general adult medical examination with abnormal findings: Secondary | ICD-10-CM

## 2024-07-16 DIAGNOSIS — R202 Paresthesia of skin: Secondary | ICD-10-CM | POA: Diagnosis not present

## 2024-07-16 DIAGNOSIS — L659 Nonscarring hair loss, unspecified: Secondary | ICD-10-CM

## 2024-07-16 DIAGNOSIS — K2100 Gastro-esophageal reflux disease with esophagitis, without bleeding: Secondary | ICD-10-CM | POA: Diagnosis not present

## 2024-07-16 DIAGNOSIS — J301 Allergic rhinitis due to pollen: Secondary | ICD-10-CM | POA: Diagnosis not present

## 2024-07-16 DIAGNOSIS — Z0000 Encounter for general adult medical examination without abnormal findings: Secondary | ICD-10-CM | POA: Diagnosis not present

## 2024-07-16 MED ORDER — RIBOFLAVIN 400 MG PO TABS: 1.0000 | ORAL_TABLET | Freq: Every day | ORAL | Status: AC

## 2024-07-16 MED ORDER — FEXOFENADINE HCL 180 MG PO TABS: 180.0000 mg | ORAL_TABLET | Freq: Every day | ORAL | Status: AC

## 2024-07-16 NOTE — Assessment & Plan Note (Signed)
 Markedly elevated chol levels with statin intolerance.  Update labs  The 10-year ASCVD risk score (Arnett DK, et al., 2019) is: 2.4%   Values used to calculate the score:     Age: 59 years     Clinically relevant sex: Female     Is Non-Hispanic African American: No     Diabetic: No     Tobacco smoker: No     Systolic Blood Pressure: 106 mmHg     Is BP treated: No     HDL Cholesterol: 54 mg/dL     Total Cholesterol: 267 mg/dL

## 2024-07-16 NOTE — Patient Instructions (Addendum)
 [facility].  Call to schedule mammogram at your convenience: [facility] [phone] Considere vacunas contra pneumonia y culebrilla.  Comeinze vitaminas de nuevo incluyendo coenzyma Q10 50mg  2 veces al dia.  Regresar en 1 ao para proximo examen fisico

## 2024-07-16 NOTE — Assessment & Plan Note (Signed)
 Preventative protocols reviewed and updated unless pt declined. Discussed healthy diet and lifestyle.

## 2024-07-16 NOTE — Progress Notes (Unsigned)
 " Ph: [phone] Fax: [phone]   Patient ID: Isabella Robertson, female    DOB: [birth year], 59 y.o.   MRN: [id number]  This visit was conducted in person.  BP 106/78 (BP Location: Left Arm, Patient Position: Sitting, Cuff Size: Normal)   Pulse 80   Temp 98.1 F (36.7 C) (Oral)   Ht 5' 0.75 (1.543 m)   Wt 150 lb (68 kg)   SpO2 95%   BMI 28.58 kg/m    CC: CPE Subjective:   HPI: Isabella Robertson is a 59 y.o. female presenting on 07/16/2024 for Annual Exam (Pt states she has pins and needles in her fingers, nails are white colored, striated/ dry brittle, and skin is very dry, also hair thinning, pt is worried wether this is age related or not, onset 6 months//Random pains in thighs that comes and goes with no known cause, only when resting, onset 6 months)   Last week completed allergy testing through [facility]). Told she was allergic to dogs, cats, grass, mold. Records requested. She has dog at home.   Episode of visual disturbance associated with L eye discomfort 04/2021 s/p carotid US  and brain MRI 08/2021. Started on aspirin  and statin given concern for TIA. Does have h/o migraines. Subsequently saw neurology who diagnosed her with chronic migraine with aura, started on magnesium  and riboflavin  for prevention and NSAID or aspirin  for abortive treatment.    HLD - atorvastatin  and pravastatin  intolerance - last visit we started rosuvastatin . Father with h/o CAD.  Cholesterol levels improved on crestor  10mg  daily (08/2023) and subsequently again deteriorated off crestor  (12/2023). Latest TC 267, trig 169, HDL 54, LDL 182. She is now back on Crestor  daily.  Lp(a) 12.1 (08/2023).   IBS with diarrhea -has seen GI, managed with PRN dicyclomine  and omeprazole  40mg  bid.    Preventative: Colonoscopy 04/2020 - hemorrhoids, diverticulosis, repeat 10 yrs ([facility]) EGD 04/2020 - normal esophagus, duodenum, gastric mucosal atrophy s/p  biopsies showing chronic gastritis and reflux esophagitis ([facility])  Mammogram 07/2023- [facility] @ [facility] center  Well woman exam - s/p hysterectomy 2010 for heavy bleeding ?fibroids, ovaries remain.  DEXA scan - not due yet Lung cancer screening - not eligible Flu shot - declines COVID vaccine Pfizer 09/2019, 10/2019, booster 06/2020  Tetanus - declines, thinks last done 2003  Pneumonia shot - not due Shingrix - discussed, declines. Has not had chicken pox.  Seat belt use discussed  Sunscreen use discussed. No changing moles on skin.  Sleep - averaging 8 hours/night  Non smoker Alcohol - [employer] - yearly Eye exam - yearly   Lives with husband and daughter  From [place], [place] Occ: Replacement limited since 2006  Activity: walks regularly at work  Diet: good water intake, fruits/vegetables daily      Relevant past medical, surgical, family and social history reviewed and updated as indicated. Interim medical history since our last visit reviewed. Allergies and medications reviewed and updated. Outpatient Medications Prior to Visit  Medication Sig Dispense Refill   albuterol  (VENTOLIN  HFA) 108 (90 Base) MCG/ACT inhaler Inhale 1-2 puffs into the lungs every 4 (four) hours as needed for wheezing or shortness of breath. 8.5 g 0   montelukast  (SINGULAIR ) 10 MG tablet Take 1 tablet (10 mg total) by mouth at bedtime. 30 tablet 3   omeprazole  (PRILOSEC) 40 MG capsule Take 1 capsule (40 mg total) by mouth 2 (two) times daily. (Patient taking differently: Take 40  mg by mouth daily as needed (GERD).) 180 capsule 3   rosuvastatin  (CRESTOR ) 10 MG tablet Take 1 tablet (10 mg total) by mouth daily. 90 tablet 3   aspirin  81 MG chewable tablet Chew 162 mg by mouth daily as needed for headache.     benzonatate  (TESSALON ) 200 MG capsule Take 1 capsule (200 mg total) by mouth 3 (three) times daily as needed for cough. 20 capsule 1   Cetirizine  HCl (ZYRTEC  ALLERGY) 10 MG CAPS Take 10 mg by mouth as  needed.     Menthol , Topical Analgesic, (BIOFREEZE COOL THE PAIN) 4 % GEL Apply 1 Application topically 4 (four) times daily as needed.     Riboflavin  400 MG TABS Take 1 tablet by mouth daily. 30 tablet    rosuvastatin  (CRESTOR ) 10 MG tablet Take 1 tablet (10 mg total) by mouth daily. 90 tablet 4   Cholecalciferol  (VITAMIN D3) 25 MCG (1000 UT) CAPS Take 1 capsule (1,000 Units total) by mouth daily. (Patient not taking: Reported on 07/16/2024)     Coenzyme Q10 (CO Q-10) 100 MG CAPS Take 1 capsule by mouth daily. (Patient not taking: Reported on 07/16/2024)  0   dicyclomine  (BENTYL ) 10 MG capsule Take 1 capsule (10 mg total) by mouth 2 (two) times daily as needed for spasms. (Patient not taking: Reported on 07/16/2024) 60 capsule 0   fluticasone  (FLONASE ) 50 MCG/ACT nasal spray Place 1 spray into both nostrils 2 (two) times daily. (Patient not taking: Reported on 07/16/2024) 16 g 3   Magnesium  400 MG TABS Take 1 tablet by mouth daily. (Patient not taking: Reported on 07/16/2024)     sodium chloride  (OCEAN) 0.65 % SOLN nasal spray Place 2 sprays into both nostrils every 2 (two) hours while awake. (Patient not taking: Reported on 07/16/2024)     No facility-administered medications prior to visit.     Per HPI unless specifically indicated in ROS section below Review of Systems  Constitutional:  Negative for activity change, appetite change, chills, fatigue, fever and unexpected weight change.  HENT:  Negative for hearing loss.   Eyes:  Negative for visual disturbance.  Respiratory:  Positive for chest tightness and shortness of breath (when exposed to plants she's allergic to). Negative for cough and wheezing.   Cardiovascular:  Negative for chest pain, palpitations and leg swelling.  Gastrointestinal:  Negative for abdominal distention, abdominal pain, blood in stool, constipation, diarrhea, nausea and vomiting.  Genitourinary:  Negative for difficulty urinating and hematuria.  Musculoskeletal:   Negative for arthralgias, myalgias and neck pain.  Skin:  Negative for rash.  Neurological:  Positive for headaches (occ migraine). Negative for dizziness, seizures and syncope.  Hematological:  Negative for adenopathy. Does not bruise/bleed easily.  Psychiatric/Behavioral:  Negative for dysphoric mood. The patient is not nervous/anxious.     Objective:  BP 106/78 (BP Location: Left Arm, Patient Position: Sitting, Cuff Size: Normal)   Pulse 80   Temp 98.1 F (36.7 C) (Oral)   Ht 5' 0.75 (1.543 m)   Wt 150 lb (68 kg)   SpO2 95%   BMI 28.58 kg/m   Wt Readings from Last 3 Encounters:  07/16/24 150 lb (68 kg)  05/13/23 144 lb 4 oz (65.4 kg)  05/10/22 149 lb 9.6 oz (67.9 kg)      Physical Exam Vitals and nursing note reviewed.  Constitutional:      Appearance: Normal appearance. She is not ill-appearing.  HENT:     Head: Normocephalic and atraumatic.  Right Ear: Tympanic membrane, ear canal and external ear normal. There is no impacted cerumen.     Left Ear: Tympanic membrane, ear canal and external ear normal. There is no impacted cerumen.     Mouth/Throat:     Mouth: Mucous membranes are moist.     Pharynx: Oropharynx is clear. No oropharyngeal exudate or posterior oropharyngeal erythema.  Eyes:     General:        Right eye: No discharge.        Left eye: No discharge.     Extraocular Movements: Extraocular movements intact.     Conjunctiva/sclera: Conjunctivae normal.     Pupils: Pupils are equal, round, and reactive to light.  Neck:     Thyroid : No thyroid  mass or thyromegaly.  Cardiovascular:     Rate and Rhythm: Normal rate and regular rhythm.     Pulses: Normal pulses.     Heart sounds: Normal heart sounds. No murmur heard. Pulmonary:     Effort: Pulmonary effort is normal. No respiratory distress.     Breath sounds: Normal breath sounds. No wheezing, rhonchi or rales.  Abdominal:     General: Bowel sounds are normal. There is no distension.     Palpations:  Abdomen is soft. There is no mass.     Tenderness: There is no abdominal tenderness. There is no guarding or rebound.     Hernia: No hernia is present.  Musculoskeletal:        General: Tenderness present.     Cervical back: Normal range of motion and neck supple. No rigidity.     Right lower leg: No edema.     Left lower leg: No edema.     Comments:  Tender mild swelling to right 2rd PIP/DIP with nodularity without erythema or warmth  Lymphadenopathy:     Cervical: No cervical adenopathy.  Skin:    General: Skin is warm and dry.     Findings: No rash.     Comments:  Mild ridging to bilateral finger nails   Neurological:     General: No focal deficit present.     Mental Status: She is alert. Mental status is at baseline.     Comments:  Sensation intact to light touch bilateral hands  Psychiatric:        Mood and Affect: Mood normal.        Behavior: Behavior normal.       Results for orders placed or performed in visit on 12/29/23  Executive Panel   Collection Time: 12/29/23  8:52 AM  Result Value Ref Range   Glucose 80 70 - 99 mg/dL   Uric Acid 5.7 3.0 - 7.2 mg/dL   BUN 15 6 - 24 mg/dL   Creatinine, Ser 9.18 0.57 - 1.00 mg/dL   eGFR 85 >40 fO/fpw/8.26   BUN/Creatinine Ratio 19 9 - 23   Sodium 141 134 - 144 mmol/L   Potassium 4.3 3.5 - 5.2 mmol/L   Chloride 101 96 - 106 mmol/L   Calcium  9.5 8.7 - 10.2 mg/dL   Phosphorus 3.9 3.0 - 4.3 mg/dL   Total Protein 7.7 6.0 - 8.5 g/dL   Albumin 4.6 3.8 - 4.9 g/dL   Globulin, Total 3.1 1.5 - 4.5 g/dL   Bilirubin Total 0.7 0.0 - 1.2 mg/dL   Alkaline Phosphatase 75 44 - 121 IU/L   LDH 159 119 - 226 IU/L   AST 22 0 - 40 IU/L   ALT 21 0 - 32  IU/L   GGT 21 0 - 60 IU/L   Iron 88 27 - 159 ug/dL   Cholesterol, Total 732 (H) 100 - 199 mg/dL   Triglycerides 830 (H) 0 - 149 mg/dL   HDL 54 >60 mg/dL   VLDL Cholesterol Cal 31 5 - 40 mg/dL   LDL Chol Calc (NIH) 817 (H) 0 - 99 mg/dL   Chol/HDL Ratio 4.9 (H) 0.0 - 4.4 ratio   Estimated  CHD Risk 1.3 (H) 0.0 - 1.0 times avg.   TSH 1.130 0.450 - 4.500 uIU/mL   T4, Total 7.0 4.5 - 12.0 ug/dL   T3 Uptake Ratio 29 24 - 39 %   Free Thyroxine Index 2.0 1.2 - 4.9   WBC 3.8 3.4 - 10.8 x10E3/uL   RBC 4.80 3.77 - 5.28 x10E6/uL   Hemoglobin 14.7 11.1 - 15.9 g/dL   Hematocrit 54.8 65.9 - 46.6 %   MCV 94 79 - 97 fL   MCH 30.6 26.6 - 33.0 pg   MCHC 32.6 31.5 - 35.7 g/dL   RDW 87.5 88.2 - 84.5 %   Platelets 266 150 - 450 x10E3/uL   Neutrophils 33 Not Estab. %   Lymphs 50 Not Estab. %   Monocytes 8 Not Estab. %   Eos 7 Not Estab. %   Basos 2 Not Estab. %   Neutrophils Absolute 1.3 (L) 1.4 - 7.0 x10E3/uL   Lymphocytes Absolute 1.9 0.7 - 3.1 x10E3/uL   Monocytes Absolute 0.3 0.1 - 0.9 x10E3/uL   EOS (ABSOLUTE) 0.3 0.0 - 0.4 x10E3/uL   Basophils Absolute 0.1 0.0 - 0.2 x10E3/uL   Immature Granulocytes 0 Not Estab. %   Immature Grans (Abs) 0.0 0.0 - 0.1 x10E3/uL  Hemoglobin A1c   Collection Time: 12/29/23  8:52 AM  Result Value Ref Range   Hgb A1c MFr Bld 5.5 4.8 - 5.6 %    Assessment & Plan:   Problem List Items Addressed This Visit     Encounter for general adult medical examination with abnormal findings - Primary (Chronic)   Preventative protocols reviewed and updated unless pt declined. Discussed healthy diet and lifestyle.       Irritable bowel syndrome with diarrhea   Continue dicyclomine  PRN      Chronic migraine w/o aura w/o status migrainosus, not intractable   Saw neurology  On magnesium  and riboflavin  preventatively - will restart these.       GERD (gastroesophageal reflux disease)   Stable period on PRN PPI       Allergic rhinitis   Saw allergist last week, allergy testing results requested She continues singulair , cetirizine /allegra       HLD (hyperlipidemia)   Markedly elevated chol levels with statin intolerance.  She has been more regular with rosuvastatin  10mg  daily.  The 10-year ASCVD risk score (Arnett DK, et al., 2019) is: 2.4%   Values  used to calculate the score:     Age: 62 years     Clinically relevant sex: Female     Is Non-Hispanic African American: No     Diabetic: No     Tobacco smoker: No     Systolic Blood Pressure: 106 mmHg     Is BP treated: No     HDL Cholesterol: 54 mg/dL     Total Cholesterol: 267 mg/dL       Relevant Orders   Lipid panel   Basic metabolic panel with GFR   CK   Asthma   Stable period on daily singulair  with  PRN albuterol  rescue inhaler.       Vitamin D  deficiency   Restart vit D 1000 units daily.       Relevant Orders   VITAMIN D  25 Hydroxy (Vit-D Deficiency, Fractures)   Paresthesia   Update labs including vitamin B12      Relevant Orders   Vitamin B12   Hair loss   Recent TSH normal (12/2023) Update labs including iron panel.  She also notes ridging to fingernails.       Relevant Orders   Ferritin   IBC panel     Meds ordered this encounter  Medications   fexofenadine  (ALLEGRA  ALLERGY) 180 MG tablet    Sig: Take 1 tablet (180 mg total) by mouth daily.   Riboflavin  400 MG TABS    Sig: Take 1 tablet by mouth daily. Vitamin B2    Orders Placed This Encounter  Procedures   Lipid panel   Basic metabolic panel with GFR   Vitamin B12   Ferritin   IBC panel   VITAMIN D  25 Hydroxy (Vit-D Deficiency, Fractures)   CK    Patient Instructions  Laboratorios hoy.  Call to schedule mammogram at your convenience: [facility] [phone] Considere vacunas contra pneumonia y culebrilla.  Comeinze vitaminas de nuevo incluyendo coenzyma Q10 50mg  2 veces al dia.  Regresar en 1 ao para proximo examen fisico  Follow up plan: Return in about 1 year (around 07/16/2025), or if symptoms worsen or fail to improve, for annual exam, prior fasting for blood work.  Anton Blas, MD   "

## 2024-07-17 DIAGNOSIS — R202 Paresthesia of skin: Secondary | ICD-10-CM | POA: Insufficient documentation

## 2024-07-17 DIAGNOSIS — L659 Nonscarring hair loss, unspecified: Secondary | ICD-10-CM | POA: Insufficient documentation

## 2024-07-17 LAB — BASIC METABOLIC PANEL WITH GFR
BUN: 14 mg/dL (ref 6–23)
CO2: 28 meq/L (ref 19–32)
Calcium: 9.5 mg/dL (ref 8.4–10.5)
Chloride: 104 meq/L (ref 96–112)
Creatinine, Ser: 0.88 mg/dL (ref 0.40–1.20)
GFR: 72.51 mL/min
Glucose, Bld: 94 mg/dL (ref 70–99)
Potassium: 4.2 meq/L (ref 3.5–5.1)
Sodium: 140 meq/L (ref 135–145)

## 2024-07-17 LAB — LIPID PANEL
Cholesterol: 204 mg/dL — ABNORMAL HIGH (ref 28–200)
HDL: 52.3 mg/dL
LDL Cholesterol: 101 mg/dL — ABNORMAL HIGH (ref 10–99)
NonHDL: 151.43
Total CHOL/HDL Ratio: 4
Triglycerides: 252 mg/dL — ABNORMAL HIGH (ref 10.0–149.0)
VLDL: 50.4 mg/dL — ABNORMAL HIGH (ref 0.0–40.0)

## 2024-07-17 LAB — IBC PANEL
Iron: 75 ug/dL (ref 42–145)
Saturation Ratios: 20.4 % (ref 20.0–50.0)
TIBC: 366.8 ug/dL (ref 250.0–450.0)
Transferrin: 262 mg/dL (ref 212.0–360.0)

## 2024-07-17 LAB — CK: Total CK: 144 U/L (ref 17–177)

## 2024-07-17 LAB — FERRITIN: Ferritin: 127.9 ng/mL (ref 10.0–291.0)

## 2024-07-17 LAB — VITAMIN D 25 HYDROXY (VIT D DEFICIENCY, FRACTURES): VITD: 20.94 ng/mL — ABNORMAL LOW (ref 30.00–100.00)

## 2024-07-17 LAB — VITAMIN B12: Vitamin B-12: 426 pg/mL (ref 211–911)

## 2024-07-17 NOTE — Assessment & Plan Note (Signed)
 Update labs including vitamin B12

## 2024-07-17 NOTE — Assessment & Plan Note (Signed)
 Continue dicyclomine PRN

## 2024-07-17 NOTE — Assessment & Plan Note (Signed)
 Stable period on daily singulair  with PRN albuterol  rescue inhaler.

## 2024-07-17 NOTE — Assessment & Plan Note (Addendum)
 Saw neurology  On magnesium  and riboflavin  preventatively - will restart these.

## 2024-07-17 NOTE — Assessment & Plan Note (Signed)
 Restart vit D 1000 units daily.

## 2024-07-17 NOTE — Assessment & Plan Note (Signed)
Stable period on PRN PPI 

## 2024-07-17 NOTE — Assessment & Plan Note (Addendum)
 Saw allergist last week, allergy testing results requested She continues singulair , cetirizine /allegra 

## 2024-07-17 NOTE — Assessment & Plan Note (Signed)
 Recent TSH normal (12/2023) Update labs including iron panel.  She also notes ridging to fingernails.

## 2024-07-23 ENCOUNTER — Ambulatory Visit: Payer: Self-pay | Admitting: Family Medicine

## 2024-08-01 ENCOUNTER — Telehealth: Payer: Self-pay | Admitting: General Practice

## 2024-08-01 NOTE — Telephone Encounter (Signed)
 RN spoke with Isabella Robertson today regarding Preventative Health Care assessment. She had lab work and biometrics completed with her PCP last week. Ashly provided verbal consent to use these results for the [facility] benefit. A HIPAA form will be presented for her to sign upon her visit to the clinic to complete required paperwork.
